# Patient Record
Sex: Female | Born: 1950
Health system: Southern US, Community
[De-identification: ages and names within clinical notes are randomized; demographics above are authoritative.]

## PROBLEM LIST (undated history)

## (undated) DIAGNOSIS — E785 Hyperlipidemia, unspecified: Secondary | ICD-10-CM

## (undated) DIAGNOSIS — I10 Essential (primary) hypertension: Secondary | ICD-10-CM

## (undated) DIAGNOSIS — I639 Cerebral infarction, unspecified: Secondary | ICD-10-CM

## (undated) DIAGNOSIS — I1A0 Resistant hypertension: Secondary | ICD-10-CM

## (undated) HISTORY — DX: Resistant hypertension: I1A.0

## (undated) HISTORY — DX: Essential (primary) hypertension: I10

---

## 2006-08-14 ENCOUNTER — Ambulatory Visit: Payer: Self-pay | Admitting: Cardiology

## 2006-08-15 ENCOUNTER — Inpatient Hospital Stay (HOSPITAL_COMMUNITY): Admission: AD | Admit: 2006-08-15 | Discharge: 2006-08-18 | Payer: Self-pay | Admitting: Cardiology

## 2006-08-15 ENCOUNTER — Ambulatory Visit: Payer: Self-pay | Admitting: Cardiology

## 2006-09-24 ENCOUNTER — Ambulatory Visit: Payer: Self-pay | Admitting: Physician Assistant

## 2006-09-26 ENCOUNTER — Ambulatory Visit: Payer: Self-pay | Admitting: Cardiology

## 2006-10-10 ENCOUNTER — Ambulatory Visit: Payer: Self-pay | Admitting: Cardiology

## 2006-11-14 ENCOUNTER — Ambulatory Visit: Payer: Self-pay | Admitting: Cardiology

## 2006-11-28 ENCOUNTER — Ambulatory Visit: Payer: Self-pay | Admitting: Cardiology

## 2008-06-26 ENCOUNTER — Encounter: Payer: Self-pay | Admitting: Cardiology

## 2010-06-28 NOTE — Assessment & Plan Note (Signed)
[facility]                          [facility]   Danielle Jordan, Danielle Jordan                        MRN:          [id number]  DATE:11/14/2006                            DOB:          August 07, 1950    HISTORY OF PRESENT ILLNESS:  The patient is an African-American female  recently evaluated for uncontrolled hypertension.  Adjustments in  medications were made, but, unfortunately, the patient did not make  those changes.  In particular, Norvasc was supposed to be increased as  well as carvedilol.  She has a long-standing history of hypertension,  but further evaluation for secondary cause of hypertension was  performed.  In particular, the patient had a 24-hour urine collection to  rule out pheochromocytoma.  The 24-hour urine collection, however, was  negative for pheochromocytoma.  Prior cardiac catheterization also  demonstrated no renal artery stenosis.  The patient states that she is  otherwise compliant with her medications.  She has no symptoms  associated with significant hypertension.   MEDICATIONS:  1. Potassium 20 mEq p.o. daily.  2. Triamterine/hydrochlorothiazide 37.5/25 mg p.o. daily.  3. Simvastatin 40 mg a day.  4. Lisinopril 40 mg a day.  5. Clonidine patch 0.1 mg q. 7 days.  6. Norvasc 5 mg a day.  7. Carvedilol 25 mg, the patient is only taking it once a day.   PHYSICAL EXAMINATION:  VITAL SIGNS:  Blood pressure 164/108, heart rate  85, weight is 209 pounds.  NECK EXAM:  Normal carotid upstrokes, no carotid bruits.  LUNGS:  Clear breath sounds bilaterally.  HEART:  Regular rate and rhythm, normal S1, S2, no murmurs, rubs, or  gallops.  ABDOMEN:  Soft.  EXTREMITY EXAM:  No cyanosis, clubbing, or edema.   PROBLEM LIST:  1. Uncontrolled hypertension.      a.     No evidence of renal artery stenosis by catheterization.      b.     Negative for 24-hour urine collection for pheochromocytoma.      c.     Rule out  hyperaldosteronism.  2. Normal coronary arteries by catheterization.  3. Normal left ventricular function.  4. Glucose __________ .  5. Medical noncompliance.   PLAN:  1. I have increased the patient's Norvasc to 10 mg a day and made sure      that she takes carvedilol 25 mg p.o. b.i.d.  If the patient still      has poorly controlled blood pressure, she could have an evaluation      with plasma renin activity serum aldosterone level to rule out      hyperaldosteronism.  2. The patient will followup for an RN visit next week to reassess her      blood pressure.  3. Additionally, clonidine could also be increased.     Learta Codding, MD,FACC  Electronically Signed    GED/MedQ  DD: 11/15/2006  DT: 11/15/2006  Job #: [id number]

## 2010-06-28 NOTE — Assessment & Plan Note (Signed)
[facility]                          [facility]   Danielle Jordan, Danielle Jordan                        MRN:          [id number]  DATE:09/24/2006                            DOB:          06/09/1950    PRIMARY CARE PHYSICIAN:  Danielle Jordan, M.D.   PRIMARY CARDIOLOGIST:  Danielle Jordan, M.D.   HISTORY OF PRESENT ILLNESS:  Danielle Jordan is a 60 year old female patient  without previously known coronary disease who was seen at [facility] in consultation by Danielle Jordan on August 15, 2006.  She presented  complaining of chest tightness and she had borderline elevated  troponin's that were somewhat nonspecific.  Her blood pressure was  significantly elevated in the 200's/mid 100's.  Her Cardiolite study was  somewhat positive with an anterior apical defect consistent with  ischemia.  She was transferred to [facility] for further  evaluation and treatment.  Cardiac catheterization was done on August 16, 2006 by Dr. Riley Jordan.  This revealed no critical coronary artery disease.  She did have a 30% segmental narrowing of the acute margin of the right  coronary artery.  The rest of her coronaries were normal.  Her overall  left ventricular function was preserved with an ejection fraction of 55  to 60% and vigorous left ventricular function.  Distal aortogram  revealed no evidence of high grade renal artery stenosis.  During her  hospitalization she had multiple changes in her medications to help  control her blood pressure which was uncontrolled.  She did undergo 24  hour urine for protein.  This was positive for proteinuria with 180 mg  per day.  She was discharged from the hospital and set up for followup  today.  Of note from the discharge summary, she was noted to have  impaired glucose tolerance.  It was suggested that she followup with Dr.  Linna Jordan for this.  She also was noted to possibly have sleep apnea.  This  was to be worked up as an  outpatient.  There was also question of  whether or not she should have a 24 hour urine to rule out  pheochromocytoma.   She returns to the office today for followup.  Overall, she seems to be  doing well.  She denies any chest pain or shortness of breath.  She  denies any syncope or near syncope.  She denies any orthopnea, PND,  pedal edema.  She denies any palpitations.  She denies any headache or  blurred vision.   CURRENT MEDICATIONS:  1. Potassium 20 mEq daily.  2. Maxzide 37.5/25 mg daily.  3. Simvastatin 40 mg daily.  4. Lisinopril 20 mg daily.  5. Carvedilol 12.5 mg b.i.d.  6. Clonidine patch 0.1 mg q.7 days.   PHYSICAL EXAMINATION:  GENERAL APPEARANCE:  She is a well-nourished,  well-developed female in no distress.  VITAL SIGNS:  Blood pressure initially was 215/117.  HEENT:  Fundi are visualized bilaterally without signs of papilledema.  NECK:  Without jugular venous distention.  CARDIAC:  Normal S1 and S2.  Regular rate and rhythm  without murmurs.  LUNGS:  Clear to auscultation bilaterally without rales, rhonchi or  wheezing.  ABDOMEN:  Soft, nontender, with normal bowel sounds.  No organomegaly.  EXTREMITIES:  Without edema.  Calves soft, nontender.  SKIN:  Warm and dry.  NEUROLOGICAL:  She is alert and oriented x3.  Cranial nerves II-XII are  grossly intact.   CLINICAL DATA:  Electrocardiogram reveals sinus rhythm with heart rate  of 69.  Normal axis.  No acute changes.   IN OFFICE CARE:  The patient was given 0.1 mg of Clonidine x3 and  watched over the next two hours.  Her blood pressure eventually came  down to 188/114 on the left and 160/108 on the right.  Again, she  remained asymptomatic.   IMPRESSION:  1. Uncontrolled hypertension.  2. Essentially normal coronary arteries by catheterization with      minimal plaquing in the right coronary artery as outlined above by      recent catheterization on August 16, 2006.  3. Preserved left ventricular  function.  4. Glucose intolerance.      a.     Needs followup with primary care.  5. History of noncompliance.   PLAN:  As noted above, we spent around 2-1/2 to 3 hours with the patient  today trying to get her blood pressure down.  She remained asymptomatic  throughout.  She was given a total of 0.3 mg of Clonidine.  She needs  better control of her blood pressure.  At this point in time we will  plan to:  1. Initiate Norvasc 5 mg daily.  2. Increase her Lisinopril to 40 mg daily.  3. Check a BMET today.  4. She will have a followup blood pressure check with the nurse      Wednesday of this week.  At that time, if her blood pressure      remains uncontrolled, we will increase her Catapres patch to 0.2      mg.  5. She will be set up for a 24 hour urine to check for VMA      catecholamines and metanephrine's to rule out the possibility of      pheochromocytoma.  6. She will also be set up for apnea link at home to rule out the      possibility of sleep apnea which may be contributing somewhat to      her hypertension.  7. She will need a followup BMET in a week to follow up on her renal      function and potassium given the increase in her ACE inhibitor.  8. She will be set up for followup in two weeks in this office.  9. She has been encouraged to follow up with Dr. Linna Jordan for recent      diagnosis of glucose intolerance.      Danielle Newcomer, PA-C  Electronically Signed      Danielle Sidle, MD  Electronically Signed   SW/MedQ  DD: 09/24/2006  DT: 09/25/2006  Job #: [id number]   cc:   Danielle Downer, MD

## 2010-06-28 NOTE — Assessment & Plan Note (Signed)
[facility]                          [facility]   Danielle Jordan, Danielle Jordan                        MRN:          [id number]  DATE:10/10/2006                            DOB:          1950-09-11    CARDIOLOGIST:  Dr. Lewayne Bunting   PRIMARY CARE PHYSICIAN:  She will be established with Dr. Linward Foster.   REASON FOR VISIT:  Two week followup.   HISTORY OF PRESENT ILLNESS:  Danielle Jordan is a 60 year old female patient  with uncontrolled hypertension and returns to the office today for  followup.  Please see the note from September 24, 2006, for complete  details.  Since being seen in the office on August 11, we have adjusted  her medications somewhat.  She is now on 10 mg of Norvasc a day.  She  was seen in followup on August 13, and her blood pressure was much  improved to 149/94.  She admits to medication compliance.  She denies  chest pain, shortness of breath.  Denies headaches or blurry vision.  Denies any syncope.   CURRENT MEDICATIONS:  1. Potassium 20 mEq daily.  2. Triamterine/HCTZ 37.5/25 mg daily.  3. Simvastatin 40 mg daily.  4. Lisinopril 40 mg daily.  5. Carvedilol 12.5 mg b.i.d.  6. Clonidine patch 0.1 mg q 7 days.  7. Norvasc 10 mg daily.   ALLERGIES:  No known drug allergies.   PHYSICAL EXAM:  She is a well-nourished, well-developed female.  Blood pressure is 166/111, pulse 69, weight 207 pounds.  HEENT:  Normal.  NECK:  Without JVD.  CARDIAC:  Normal S1, S2.  Regular rate and rhythm.  LUNGS:  Clear to auscultation bilaterally.  ABDOMEN:  Soft, nontender.  EXTREMITIES:  Without edema.  Calves are soft, nontender.  NEUROLOGIC:  She is alert and oriented x3.  Cranial nerves 2-12 grossly  intact.   IMPRESSION:  1. Uncontrolled hypertension.      a.     No renal artery stenosis at catheterization.      b.     Recent 24 hour urine negative for pheochromocytoma.      c.     Apnea link pending to screen for obstructive  sleep apnea.  2. Essentially normal coronary arteries by catheterization with      minimal plaque in the right coronary artery by catheterization,      August 16, 2006.  3. Good left ventricular function.  4. Glucose intolerance.  5. History of medical noncompliance.   PLAN:  The patient presents to the office today for followup.  Her blood  pressure continues to remain uncontrolled.  She admits to medication  compliance.  At this point in time, I plan to adjust her medications for  better blood pressure control.  1. We will increase her carvedilol to 25 mg b.i.d.  2. We will increase her Clonidine patch to 0.2 mg, apply q 7 days.  3. We will have her follow up with blood pressure check with the nurse      in 1 week.  4. We will have her follow  up in this office within the next 4-6      weeks.  5. She would like to change her primary care physician.  She does need      followup on glucose intolerance.  I will refer her to Dr. Linward Foster to be established for primary care.   DISPOSITION:  We will reassess the patient's blood pressure when she  returns.  Of note, the patient did initially present on no medications  with uncontrolled hypertension and hypokalemia.  We may want to consider  a diagnosis of hyperaldosteronism.  Therefore, in followup, we may want  to consider workup for this.  Of note, she is on an ACE inhibitor,  potassium-sparing diuretic, and potassium supplementation with normal  potassium recently.      Tereso Newcomer, PA-C  Electronically Signed      Learta Codding, MD,FACC  Electronically Signed   SW/MedQ  DD: 10/10/2006  DT: 10/11/2006  Job #: [id number]

## 2010-06-28 NOTE — Discharge Summary (Signed)
[name], Jordan NO.:  [id number]   MEDICAL RECORD NO.:  [id number]          PATIENT TYPE:  INP   LOCATION:  3711                         FACILITY:  [facility]   PHYSICIAN:  Dorian Pod, ACNP  DATE OF BIRTH:  1950-03-14   DATE OF ADMISSION:  08/15/2006  DATE OF DISCHARGE:  08/18/2006                               DISCHARGE SUMMARY   DISCHARGING DIAGNOSES:  1. Hypertension, poorly controlled.  2. Chest pain, status post cardiac catheterization this admission      showing no critical coronary obstruction, no evidence of high-grade      renal artery stenosis, normal ejection fraction of 55-60%,      reasonably vigorous and severe hypertension, uncontrolled, with      poor patient compliance.  Status post 24-hour urine, urine protein      189.   Past medical history includes:  1. Hypertension, poorly controlled.  2. History of medical noncompliance to medications.  3. Status post adenosine Cardiolite, small the moderate anterior      apical defect which is partially reversible, with EF of 45% on August 15, 2006, done at [facility].  4. Impaired glucose tolerance, glucose by BMET this admission 113.   HOSPITAL COURSE:  Ms. Danielle Jordan is a 60 year old female followed by Dr.  Beatriz Stallion group up in [place], who presented to [facility] complaining  of chest discomfort.  We were asked to.  Consult the patient was  underwent a stress Myoview, results as stated above.  The patient also  found to be hypertensive with a blood pressure of 200/100.  It was felt  the patient needed further evaluation secondary to risk factors for  heart disease.  The patient was transported to [facility], underwent  cardiac catheterization on August 16, 2006, results as stated above.  Post  catheterization patient stable, seen by Dr. Andee Lineman on July 4.  Blood  pressure at that time 169/110.  Patient being treated with clonidine  patch, Coreg increased to 12.5 mg b.i.d., a 24-hour urine  completed as  stated above.  Need to consider ruling out a pheochromocytoma.  Dr.  Graciela Husbands in to see the patient on day of discharge.  Blood pressure 138-159  systolic over 96-90 diastolic.  The patient without complaints.  Patient  being discharged home.  Consider probable obstructive sleep apnea.  The  patient will need outpatient sleep study, which can be arranged through  her primary care physician or Dr. Andee Lineman in [place].   At time of discharge, TSH 2.357.  Lipid panel:  Total cholesterol 159,  triglycerides 116, HDL 35, LDL 101.  H&H 13 and 39.8.  potassium 3.7,  BUN and creatinine 11 and 1.02.   Medications at time of discharge include:  1. Potassium 20 mEq daily.  2. Triamterene/hydrochlorothiazide 37.5/25 mg daily.  3. Simvastatin 40 mg daily.  4. Lisinopril 20 mg daily.  5. Carvedilol 12.5 mg b.i.d.  6. Clonidine patch 0.1 mg topical every 7 days.   The patient will need fasting lipids and liver in 4-6 weeks.  Follow up  with Dr.  Moore for impaired glucose tolerance and sleep study.  Patient  also encouraged to take an aspirin 81 mg daily.  She will need to follow  up with Dr. Andee Lineman within 2 weeks for a post catheterization check and  blood pressure monitoring.  She has been given the post cardiac  catheterization discharge instructions.  Agrees to call our office with  any problems from her catheterization site.  Otherwise, no further  cardiac testing at this time.  The patient's cholesterol and blood  pressure can be managed by her primary care physician.   Duration of discharge encounter is greater than 30 minutes.      Dorian Pod, ACNP     MB/MEDQ  D:  08/18/2006  T:  08/18/2006  Job:  [id number]   cc:   Erasmo Downer, MD

## 2010-06-28 NOTE — Cardiovascular Report (Signed)
Danielle Jordan, Danielle Jordan NO.:  [id number]   MEDICAL RECORD NO.:  [id number]          PATIENT TYPE:  INP   LOCATION:  2807                         FACILITY:  [facility]   PHYSICIAN:  Arturo Morton. Riley Kill, MD, FACCDATE OF BIRTH:  09-Nov-1950   DATE OF PROCEDURE:  08/16/2006  DATE OF DISCHARGE:                            CARDIAC CATHETERIZATION   INDICATIONS:  Danielle Jordan is a 60 year old who presents with some chest  discomfort and shortness of breath in the setting of severe hypertension  that is not well controlled.  She has had a history of hypertension  since age 55, and has not taken any medications.  She has a poor  understanding of her disease.  She was seen earlier in the day and  risks, benefits and alternatives discussed in detail.  She was brought  to the catheterization laboratory after informed consent.   PROCEDURE:  1. Left heart catheterization.  2. Selective left ventriculography.  3. Distal aortography.  4. Selective coronary arteriography.   DESCRIPTION OF PROCEDURE:  The patient was brought to the  catheterization laboratory and prepped and draped in usual fashion.  Consistent blood pressures appeared to be in the range of about 220/130.  She was given 2 doses of intravenous metoprolol; she also was given a  dose of 10 mg of intravenous hydralazine.  We gave her some time in the  laboratory, and then subsequently elected to give her a second dose of  10 mg of hydralazine.  At this point, the blood pressure was around  160/95-100.  The femoral artery was then entered with a Smart needle,  and a 5-French sheath was placed.  Pressures were then checked.  We did  give 1 dose of intra-aortic nitroglycerin and some nitroglycerin spray  and started the patient on a nitroglycerin drip; the drip was started at  approximately 15 mcg.  Central aortic and left ventricular pressures  were measured with a pigtail and ventriculography was performed in the  RAO projection.   Following this, distal aortography was performed.  The  patient did remain somewhat hypertensive at this point in time, but the  pressure then gradually came down and the nitroglycerin was discontinued  as an intravenous drip.  We then gave a dose of 250 mL of normal saline  because the pressure fell to approximately 100/60.  The pressure then  came back up into the 110-120 range, and the femoral sheath was removed.  Hemostasis was achieved by direct manual compression of greater than 20  minutes including myself and the technologist; we observed this  carefully.  Blood pressures were observed carefully in the laboratory,  and normal saline was continued.  She was not hypertensive during the  course of the procedure.   HEMODYNAMIC DATA:  Central aortic pressure was 182/110 with a mean of  142.  LV pressure was 203/10.  There was no gradient on pullback across  aortic valve.  The blood pressures are as noted in the procedural  report.   ANGIOGRAPHIC DATA:  1. Ventriculography was done in the RAO projection.  Overall, LV  function appeared to be preserved without definite segmental wall      motion abnormality.  We did not do a proximal aortic root shot, but      in the RAO view, at least the aortic root did not appear to be      significantly dilated.  We had no difficulty with the catheters.      The abdominal aorta demonstrates a fairly normal contour and smooth      size.  Renal arteries are reasonably well visualized bilaterally.      I do not visualize any obvious renal stenoses.  There was 1 renal      artery on the right and possibly 2 on the left.  That iliacs are      smooth, but somewhat tortuous.  The right coronary artery is a      fairly small-caliber vessel that has about 30% segmental narrowing      at the acute margin without critical narrowing.  The distal vessel      was really quite small.  2. The left main is a very large-caliber vessel.  The left coronaries       are hypertrophied.  The LAD courses to the apex and provides a      major diagonal branch.  It appears to be without critical      narrowing.  Likewise, there is a ramus intermedius that bifurcates      and is without critical narrowing.  Finally, there is a small AV      circumflex that also is without critical on narrowing.   CONCLUSION:  1. Reasonably vigorous left ventricular function with ejection      fraction in excess of 55% to 60%.  2. No evidence of high-grade renal artery stenosis.  3. No critical coronary obstruction.  4. Severe hypertension, uncontrolled with poor patient compliance.   PLAN:  1. The patient does have proteinuria and a 24-hour urine be obtained.  2. We will likely get a nephrology consult.  3. She will need hypertension management, which will need to be      carefully orchestrated and she will need better compliance.      Arturo Morton. Riley Kill, MD, [facility]  Electronically Signed     TDS/MEDQ  D:  08/16/2006  T:  08/17/2006  Job:  [id number]   cc:   Learta Codding, MD,FACC  Erasmo Downer, MD

## 2010-11-29 LAB — CBC
HCT: 41.7
MCHC: 32.9
MCV: 82.3
Platelets: 217
RBC: 4.76
RDW: 16.1 — ABNORMAL HIGH
WBC: 9

## 2010-11-29 LAB — BASIC METABOLIC PANEL
Calcium: 8.6
Chloride: 104
Creatinine, Ser: 1.02
GFR calc Af Amer: >60
Sodium: 136

## 2010-11-29 LAB — TSH: TSH: 2.357

## 2010-11-29 LAB — LIPID PANEL
LDL Cholesterol: 101 — ABNORMAL HIGH
VLDL: 23

## 2010-11-29 LAB — COMPREHENSIVE METABOLIC PANEL
Albumin: 3.2 — ABNORMAL LOW
BUN: 12
Calcium: 9.3
Chloride: 99
Creatinine, Ser: 1.12
GFR calc Af Amer: >60
Total Bilirubin: 1.1

## 2010-11-29 LAB — PROTEIN, URINE, 24 HOUR
Protein, 24H Urine: 189 — ABNORMAL HIGH
Urine Total Volume-UPROT: 1050

## 2010-11-29 LAB — APTT: aPTT: 31

## 2010-11-29 LAB — PROTIME-INR: INR: 1

## 2016-06-24 DIAGNOSIS — I629 Nontraumatic intracranial hemorrhage, unspecified: Secondary | ICD-10-CM | POA: Insufficient documentation

## 2016-07-04 DIAGNOSIS — I610 Nontraumatic intracerebral hemorrhage in hemisphere, subcortical: Secondary | ICD-10-CM | POA: Insufficient documentation

## 2016-07-04 DIAGNOSIS — E8881 Metabolic syndrome: Secondary | ICD-10-CM | POA: Insufficient documentation

## 2016-07-04 DIAGNOSIS — Z7409 Other reduced mobility: Secondary | ICD-10-CM | POA: Insufficient documentation

## 2016-07-04 DIAGNOSIS — I69319 Unspecified symptoms and signs involving cognitive functions following cerebral infarction: Secondary | ICD-10-CM | POA: Insufficient documentation

## 2016-07-04 DIAGNOSIS — I10 Essential (primary) hypertension: Secondary | ICD-10-CM | POA: Insufficient documentation

## 2016-07-20 DIAGNOSIS — R600 Localized edema: Secondary | ICD-10-CM | POA: Insufficient documentation

## 2016-07-20 DIAGNOSIS — G8194 Hemiplegia, unspecified affecting left nondominant side: Secondary | ICD-10-CM | POA: Insufficient documentation

## 2016-07-20 DIAGNOSIS — J9801 Acute bronchospasm: Secondary | ICD-10-CM | POA: Insufficient documentation

## 2016-07-26 ENCOUNTER — Emergency Department (HOSPITAL_COMMUNITY): Payer: BLUE CROSS/BLUE SHIELD

## 2016-07-26 ENCOUNTER — Encounter (HOSPITAL_COMMUNITY): Payer: Self-pay | Admitting: *Deleted

## 2016-07-26 ENCOUNTER — Inpatient Hospital Stay (HOSPITAL_COMMUNITY)
Admission: EM | Admit: 2016-07-26 | Discharge: 2016-07-28 | DRG: 305 | Disposition: A | Discharge status: Home / Self Care | Payer: BLUE CROSS/BLUE SHIELD | Attending: Internal Medicine | Admitting: Internal Medicine

## 2016-07-26 DIAGNOSIS — E876 Hypokalemia: Secondary | ICD-10-CM | POA: Diagnosis present

## 2016-07-26 DIAGNOSIS — Z23 Encounter for immunization: Secondary | ICD-10-CM

## 2016-07-26 DIAGNOSIS — F419 Anxiety disorder, unspecified: Secondary | ICD-10-CM | POA: Diagnosis present

## 2016-07-26 DIAGNOSIS — I161 Hypertensive emergency: Secondary | ICD-10-CM | POA: Diagnosis not present

## 2016-07-26 DIAGNOSIS — R05 Cough: Secondary | ICD-10-CM | POA: Diagnosis present

## 2016-07-26 DIAGNOSIS — I10 Essential (primary) hypertension: Secondary | ICD-10-CM | POA: Diagnosis present

## 2016-07-26 DIAGNOSIS — I69151 Hemiplegia and hemiparesis following nontraumatic intracerebral hemorrhage affecting right dominant side: Secondary | ICD-10-CM | POA: Diagnosis not present

## 2016-07-26 DIAGNOSIS — Z823 Family history of stroke: Secondary | ICD-10-CM

## 2016-07-26 DIAGNOSIS — I714 Abdominal aortic aneurysm, without rupture, unspecified: Secondary | ICD-10-CM | POA: Diagnosis present

## 2016-07-26 DIAGNOSIS — G4700 Insomnia, unspecified: Secondary | ICD-10-CM | POA: Diagnosis present

## 2016-07-26 DIAGNOSIS — T464X5A Adverse effect of angiotensin-converting-enzyme inhibitors, initial encounter: Secondary | ICD-10-CM | POA: Diagnosis present

## 2016-07-26 DIAGNOSIS — Z8249 Family history of ischemic heart disease and other diseases of the circulatory system: Secondary | ICD-10-CM | POA: Diagnosis not present

## 2016-07-26 DIAGNOSIS — E785 Hyperlipidemia, unspecified: Secondary | ICD-10-CM | POA: Diagnosis present

## 2016-07-26 DIAGNOSIS — R0789 Other chest pain: Secondary | ICD-10-CM | POA: Diagnosis present

## 2016-07-26 DIAGNOSIS — I639 Cerebral infarction, unspecified: Secondary | ICD-10-CM | POA: Diagnosis present

## 2016-07-26 DIAGNOSIS — I169 Hypertensive crisis, unspecified: Secondary | ICD-10-CM

## 2016-07-26 HISTORY — DX: Cerebral infarction, unspecified: I63.9

## 2016-07-26 HISTORY — DX: Essential (primary) hypertension: I10

## 2016-07-26 HISTORY — DX: Hyperlipidemia, unspecified: E78.5

## 2016-07-26 LAB — COMPREHENSIVE METABOLIC PANEL
ALK PHOS: 52 U/L (ref 38–126)
ALT: 19 U/L (ref 14–54)
AST: 15 U/L (ref 15–41)
Albumin: 3.9 g/dL (ref 3.5–5.0)
Anion gap: 10 (ref 5–15)
BILIRUBIN TOTAL: 0.8 mg/dL (ref 0.3–1.2)
BUN: 23 mg/dL — AB (ref 6–20)
CO2: 29 mmol/L (ref 22–32)
CREATININE: 1.31 mg/dL — AB (ref 0.44–1.00)
Calcium: 9.5 mg/dL (ref 8.9–10.3)
Chloride: 99 mmol/L — ABNORMAL LOW (ref 101–111)
GFR calc Af Amer: 49 mL/min — ABNORMAL LOW (ref >60)
GFR, EST NON AFRICAN AMERICAN: 42 mL/min — AB (ref >60)
Glucose, Bld: 126 mg/dL — ABNORMAL HIGH (ref 65–99)
Potassium: 3.4 mmol/L — ABNORMAL LOW (ref 3.5–5.1)
Sodium: 138 mmol/L (ref 135–145)
TOTAL PROTEIN: 8.1 g/dL (ref 6.5–8.1)

## 2016-07-26 LAB — CBC WITH DIFFERENTIAL/PLATELET
BASOS ABS: 0 10*3/uL (ref 0.0–0.1)
Basophils Relative: 0 %
Eosinophils Absolute: 0.2 10*3/uL (ref 0.0–0.7)
Eosinophils Relative: 2 %
HEMATOCRIT: 40.8 % (ref 36.0–46.0)
Hemoglobin: 12.7 g/dL (ref 12.0–15.0)
LYMPHS PCT: 15 %
Lymphs Abs: 1.6 10*3/uL (ref 0.7–4.0)
MCH: 26.4 pg (ref 26.0–34.0)
MCHC: 31.1 g/dL (ref 30.0–36.0)
MCV: 84.8 fL (ref 78.0–100.0)
MONO ABS: 0.4 10*3/uL (ref 0.1–1.0)
Monocytes Relative: 3 %
NEUTROS ABS: 8.6 10*3/uL — AB (ref 1.7–7.7)
Neutrophils Relative %: 80 %
Platelets: 238 10*3/uL (ref 150–400)
RBC: 4.81 MIL/uL (ref 3.87–5.11)
RDW: 14.9 % (ref 11.5–15.5)
WBC: 10.8 10*3/uL — AB (ref 4.0–10.5)

## 2016-07-26 LAB — MAGNESIUM: MAGNESIUM: 1.8 mg/dL (ref 1.7–2.4)

## 2016-07-26 LAB — TROPONIN I

## 2016-07-26 MED ORDER — POTASSIUM CHLORIDE CRYS ER 20 MEQ PO TBCR
40.0000 meq | EXTENDED_RELEASE_TABLET | Freq: Once | ORAL | Status: AC
  Administered 2016-07-26: 40 meq via ORAL
  Filled 2016-07-26: qty 2

## 2016-07-26 MED ORDER — DEXTROMETHORPHAN POLISTIREX ER 30 MG/5ML PO SUER
30.0000 mg | Freq: Every evening | ORAL | Status: DC | PRN
  Filled 2016-07-26: qty 5

## 2016-07-26 MED ORDER — ACETAMINOPHEN 325 MG PO TABS: 650.0000 mg | ORAL_TABLET | Freq: Four times a day (QID) | ORAL | Status: DC | PRN

## 2016-07-26 MED ORDER — ONDANSETRON HCL 4 MG/2ML IJ SOLN: 4.0000 mg | Freq: Four times a day (QID) | INTRAMUSCULAR | Status: DC | PRN

## 2016-07-26 MED ORDER — HYDROCHLOROTHIAZIDE 12.5 MG PO CAPS
12.5000 mg | ORAL_CAPSULE | Freq: Every day | ORAL | Status: DC
  Administered 2016-07-27 – 2016-07-28 (×2): 12.5 mg via ORAL
  Filled 2016-07-26 (×2): qty 1

## 2016-07-26 MED ORDER — ALPRAZOLAM 0.5 MG PO TABS: 0.5000 mg | ORAL_TABLET | Freq: Every evening | ORAL | Status: DC | PRN

## 2016-07-26 MED ORDER — HYDRALAZINE HCL 25 MG PO TABS
100.0000 mg | ORAL_TABLET | Freq: Three times a day (TID) | ORAL | Status: DC
  Administered 2016-07-27 – 2016-07-28 (×4): 100 mg via ORAL
  Filled 2016-07-26 (×4): qty 4

## 2016-07-26 MED ORDER — LABETALOL HCL 5 MG/ML IV SOLN
INTRAVENOUS | Status: AC
  Filled 2016-07-26: qty 16

## 2016-07-26 MED ORDER — LABETALOL HCL 200 MG PO TABS
400.0000 mg | ORAL_TABLET | Freq: Two times a day (BID) | ORAL | Status: DC
  Administered 2016-07-26 – 2016-07-28 (×4): 400 mg via ORAL
  Filled 2016-07-26 (×4): qty 2

## 2016-07-26 MED ORDER — LABETALOL HCL 5 MG/ML IV SOLN
80.0000 mg | Freq: Once | INTRAVENOUS | Status: AC
  Administered 2016-07-26: 80 mg via INTRAVENOUS

## 2016-07-26 MED ORDER — PNEUMOCOCCAL VAC POLYVALENT 25 MCG/0.5ML IJ INJ
0.5000 mL | INJECTION | INTRAMUSCULAR | Status: AC
  Administered 2016-07-27: 0.5 mL via INTRAMUSCULAR
  Filled 2016-07-26: qty 0.5

## 2016-07-26 MED ORDER — LABETALOL HCL 5 MG/ML IV SOLN
40.0000 mg | Freq: Once | INTRAVENOUS | Status: AC
  Administered 2016-07-26: 40 mg via INTRAVENOUS
  Filled 2016-07-26: qty 8

## 2016-07-26 MED ORDER — LABETALOL HCL 5 MG/ML IV SOLN
INTRAVENOUS | Status: AC
  Filled 2016-07-26: qty 4

## 2016-07-26 MED ORDER — NICARDIPINE HCL IN NACL 20-0.86 MG/200ML-% IV SOLN
3.0000 mg/h | Freq: Once | INTRAVENOUS | Status: DC
Start: 2016-07-26 — End: 2016-07-28
  Filled 2016-07-26: qty 200

## 2016-07-26 MED ORDER — NICARDIPINE HCL IN NACL 20-0.86 MG/200ML-% IV SOLN
3.0000 mg/h | Freq: Once | INTRAVENOUS | Status: AC
  Administered 2016-07-26: 3 mg/h via INTRAVENOUS
  Filled 2016-07-26: qty 200

## 2016-07-26 NOTE — ED Notes (Signed)
I gave pt a total of 5 mg of labetalol before dr zammit discontinued order. Dr Robb Matarortiz wants to start cardene drip on pt instead.

## 2016-07-26 NOTE — ED Triage Notes (Signed)
Pt was seen at [facility] on may 11th for bleed in her brain (pt doesn't know specifics). Discharged Friday and started on meds. Pt comes in today because she is having high blood pressure, nose bleeds, and left lower foot numbness and itching. The itching/numbness symptom has resolved.

## 2016-07-26 NOTE — ED Provider Notes (Signed)
[facility]-EMERGENCY DEPT Provider Note   CSN: [id number] Arrival date & time: 07/26/16  1613     History   Chief Complaint Chief Complaint  Patient presents with  . Hypertension    HPI Danielle Jordan is a 66 y.o. female.  Patient states that she was [facility] for a hemorrhagic stroke last week. She was let home on Friday. Her blood pressures been elevated ever since then and she is complained of some numbness in her foot and some bleeding from her nose today. The numbness has improved    Hypertension  This is a new problem. The current episode started more than 2 days ago. The problem occurs constantly. The problem has not changed since onset.Pertinent negatives include no chest pain, no abdominal pain and no headaches. Nothing aggravates the symptoms. Nothing relieves the symptoms. She has tried nothing for the symptoms.    Past Medical History:  Diagnosis Date  . Hypertension   . Stroke [facility])     Patient Active Problem List   Diagnosis Date Noted  . Hypertensive emergency 07/26/2016    History reviewed. No pertinent surgical history.  OB History    No data available       Home Medications    Prior to Admission medications   Medication Sig Start Date End Date Taking? Authorizing Provider  hydrALAZINE (APRESOLINE) 100 MG tablet Take 100 mg by mouth every 8 (eight) hours.  07/21/16  Yes [provider]  hydrochlorothiazide (MICROZIDE) 12.5 MG capsule Take 12.5 mg by mouth daily. 07/21/16  Yes [provider]  labetalol (NORMODYNE) 200 MG tablet Take 400 mg by mouth every 12 (twelve) hours.  07/21/16  Yes [provider]  lisinopril (PRINIVIL,ZESTRIL) 40 MG tablet Take 40 mg by mouth. 07/21/16  Yes [provider]    Family History History reviewed. No pertinent family history.  Social History Social History  Substance Use Topics  . Smoking status: Never Smoker  . Smokeless tobacco: Never Used  .  Alcohol use No     Allergies   Patient has no known allergies.   Review of Systems Review of Systems  Constitutional: Negative for appetite change and fatigue.  HENT: Negative for congestion, ear discharge and sinus pressure.        Nasal bleed  Eyes: Negative for discharge.  Respiratory: Negative for cough.   Cardiovascular: Negative for chest pain.  Gastrointestinal: Negative for abdominal pain and diarrhea.  Genitourinary: Negative for frequency and hematuria.  Musculoskeletal: Negative for back pain.  Skin: Negative for rash.  Neurological: Negative for seizures and headaches.       Foot numbness  Psychiatric/Behavioral: Negative for hallucinations.     Physical Exam Updated Vital Signs BP (!) 211/101   Pulse 67   Temp 99.3 F (37.4 C) (Oral)   Resp 18   SpO2 95%   Physical Exam  Constitutional: She is oriented to person, place, and time. She appears well-developed.  HENT:  Head: Normocephalic.  Eyes: Conjunctivae and EOM are normal. No scleral icterus.  Neck: Neck supple. No thyromegaly present.  Cardiovascular: Normal rate and regular rhythm.  Exam reveals no gallop and no friction rub.   No murmur heard. Pulmonary/Chest: No stridor. She has no wheezes. She has no rales. She exhibits no tenderness.  Abdominal: She exhibits no distension. There is no tenderness. There is no rebound.  Musculoskeletal: Normal range of motion. She exhibits no edema.  Lymphadenopathy:    She has no cervical adenopathy.  Neurological: She is oriented to person, place, and time. She exhibits normal muscle tone. Coordination normal.  Skin: No rash noted. No erythema.  Psychiatric: She has a normal mood and affect. Her behavior is normal.     ED Treatments / Results  Labs (all labs ordered are listed, but only abnormal results are displayed) Labs Reviewed  CBC WITH DIFFERENTIAL/PLATELET - Abnormal; Notable for the following:       Result Value   WBC 10.8 (*)    Neutro Abs 8.6  (*)    All other components within normal limits  COMPREHENSIVE METABOLIC PANEL - Abnormal; Notable for the following:    Potassium 3.4 (*)    Chloride 99 (*)    Glucose, Bld 126 (*)    BUN 23 (*)    Creatinine, Ser 1.31 (*)    GFR calc non Af Amer 42 (*)    GFR calc Af Amer 49 (*)    All other components within normal limits    EKG  EKG Interpretation None       Radiology Dg Chest 2 View  Result Date: 07/26/2016 CLINICAL DATA:  Cough EXAM: CHEST  2 VIEW COMPARISON:  08/17/2006 FINDINGS: Lungs are clear.  No pleural effusion or pneumothorax. The heart is top-normal in size. Degenerative changes of the visualized thoracolumbar spine. IMPRESSION: No evidence of acute cardiopulmonary disease. Electronically Signed   By: Charline BillsSriyesh  Krishnan M.D.   On: 07/26/2016 18:08   Ct Head Wo Contrast  Result Date: 07/26/2016 CLINICAL DATA:  66 year old female with left lower foot numbness, left arm numbness, hypertension and recent intracranial hemorrhage. EXAM: CT HEAD WITHOUT CONTRAST TECHNIQUE: Contiguous axial images were obtained from the base of the skull through the vertex without intravenous contrast. COMPARISON:  06/24/2016 CT FINDINGS: Brain: There has been interval resolution of the right thalamic hemorrhage with evolutionary changes in this region. Chronic small-vessel white matter ischemic changes are again identified. There is no evidence of acute infarct, hemorrhage, hydrocephalus, extra-axial collection or mass lesion. Vascular: No hyperdense vessel or unexpected calcification. Skull: Normal. Negative for fracture or focal lesion. Sinuses/Orbits: No acute finding. Other: None. IMPRESSION: No evidence of acute abnormality. Resolution of right thalamic hemorrhage with evolutionary changes in this area. Chronic small-vessel white matter ischemic changes Electronically Signed   By: Harmon PierJeffrey  Hu M.D.   On: 07/26/2016 18:04    Procedures Procedures (including critical care time)  Medications  Ordered in ED Medications  labetalol (NORMODYNE,TRANDATE) 5 MG/ML injection (not administered)  nicardipine (CARDENE) 20mg  in 0.86% saline 200ml IV infusion (0.1 mg/ml) (not administered)  labetalol (NORMODYNE,TRANDATE) injection 40 mg (40 mg Intravenous Given 07/26/16 1740)  labetalol (NORMODYNE,TRANDATE) injection 80 mg (80 mg Intravenous Given 07/26/16 1913)     Initial Impression / Assessment and Plan / ED Course  I have reviewed the triage vital signs and the nursing notes.  Pertinent labs & imaging results that were available during my care of the patient were reviewed by me and considered in my medical decision making (see chart for details).     CRITICAL CARE Performed by: Demetric Dunnaway L Total critical care time: 40 minutes Critical care time was exclusive of separately billable procedures and treating other patients. Critical care was necessary to treat or prevent imminent or life-threatening deterioration. Critical care was time spent personally by me on the following activities: development of treatment plan with patient and/or surrogate as well as nursing, discussions with consultants, evaluation of patient's response to treatment, examination of patient, obtaining history from patient  or surrogate, ordering and performing treatments and interventions, ordering and review of laboratory studies, ordering and review of radiographic studies, pulse oximetry and re-evaluation of patient's condition.  Patient with hypertensive urgency. She will be admitted to ICU and put on a nicardipine drip Final Clinical Impressions(s) / ED Diagnoses   Final diagnoses:  Hypertensive crisis    New Prescriptions New Prescriptions   No medications on file     Bethann Berkshire, MD 07/26/16 1927

## 2016-07-26 NOTE — H&P (Signed)
History and Physical    [name] ZOX:[id number] DOB: 08/26/1950 DOA: 07/26/2016  PCP: Patient, No Pcp Per   Patient coming from: Home.  I have personally briefly reviewed patient's old medical records in [facility]  Chief Complaint: Epistaxis.  HPI: Danielle Jordan is a 66 y.o. female with medical history significant of hypertension, who was just discharged from [facility] on Friday for a recent right thalamic region hemorrhagic CVA with residual right-sided hemiparesis was coming to the emergency department with complaints of epistaxis and hemoptysis following "a bad coughing spell", which she and her relatives think that is related to the patient taking lisinopril 40 mg by mouth daily for high blood pressure. Her relatives stated that her systolic blood pressure has been consistently in the 180s to 190s and her diastolic in the 110s to 120s range. She has been taking hydralazine 100 mg by mouth 3 times a day, labetalol 400 mg by mouth twice a day, hydrochlorothiazide 12.5 mg by mouth daily plus the lisinopril without significant reduction of her BP numbers. She denies dietary indiscretions and does not add salt to meals.  They also mentioned that the patient is also complaining of mild chest discomfort, which is nonradiating, does not last long and denies associated dyspnea, palpitations, diaphoresis, dizziness, nausea or emesis. She complains of mild lower extremity edema on occasion. She denies headache, fever, chills, but feels fatigued. He denies abdominal pain, diarrhea, constipation, melena, hemorrhoids or hematochezia. She denies dysuria, frequency or hematuria. She denies anxiety, depression or insomnia. She denies skin rashes.  Previous records, showed that she had a cardiac cath in 2012 that did not show renal artery stenosis and her workup was negative for pheochromocytoma that time. Last month, she had a echocardiogram, but I am unable to see those results on the care  everywhere tab at this time.  ED Course: The patient's initial blood pressure was 218/109 mmHg, her pulse 84 BPM, respirations 18/m and O2 sat 95% on room air. She was given labetalol 40 mg IVP which transiently lower the blood pressure, then a second dose of labetalol 80 mg IVP was given, but the patient was subsequently started on a nicardipine infusion.   Her EKG was normal sinus rhythm. Troponin was less than 0.03 ng/mL. Urinalysis still pending. WBC 10.8 with a normal differential, hemoglobin 12.7 grams per deciliter and platelets 238. Sodium 136, potassium 3.4, chloride 99 and bicarbonate 29 mmol/L. BUN was 23, creatinine 1.31, calcium 9.5, glucose 126 and magnesium 1.8 mg/dL.  Imaging: Her chest radiograph did not show acute cardiopulmonary pathology. CT scan of the head shows resolution and evolutionary changes of right thalamic hemorrhagic strokes when compared to previous study. Please see images sent full radiology report for further detail.  Review of Systems: As per HPI otherwise 10 point review of systems negative.    Past Medical History:  Diagnosis Date  . Hypertension Hyperlipidemia   . Stroke [facility] Residual left sided hemiparesis.     History reviewed. No pertinent surgical history.   reports that she has never smoked. She has never used smokeless tobacco. She reports that she does not drink alcohol or use drugs.  Allergies  Allergen Reactions  . Lisinopril Cough    Family History  Problem Relation Age of Onset  . Hypertension Mother   . Hypertension Father   . Arthritis Father   . Stroke Paternal Aunt     Prior to Admission medications   Medication Sig Start Date End Date Taking? Authorizing Provider  hydrALAZINE (APRESOLINE) 100 MG tablet Take 100 mg by mouth every 8 (eight) hours.  07/21/16  Yes [provider]  hydrochlorothiazide (MICROZIDE) 12.5 MG capsule Take 12.5 mg by mouth daily. 07/21/16  Yes [provider]  labetalol (NORMODYNE)  200 MG tablet Take 400 mg by mouth every 12 (twelve) hours.  07/21/16  Yes [provider]    Physical Exam: Vitals:   07/26/16 1915 07/26/16 1945 07/26/16 2030 07/26/16 2040  BP: (!) 211/101 (!) 168/92 (!) 191/103 (!) 174/92  Pulse: 67 65 70 70  Resp:  (!) 22 18 19   Temp:      TempSrc:      SpO2: 95% 91% 96% 94%    Constitutional: NAD, calm, comfortable Eyes: PERRL, Unable to visualize retina, lids and conjunctivae normal ENMT: Mucous membranes are moist. Posterior pharynx clear of any exudate or lesions. Neck: normal, supple, no masses, no thyromegaly Respiratory: Clear to auscultation bilaterally, no wheezing, no crackles. Normal respiratory effort. No accessory muscle use.  Cardiovascular: Regular rate and rhythm, no murmurs / rubs / gallops. No extremity edema. 2+ pedal pulses. No carotid bruits.  Abdomen: Soft, no tenderness, no masses palpated. No hepatosplenomegaly. Bowel sounds positive.  Musculoskeletal: no clubbing / cyanosis. Good ROM, no contractures. Normal muscle tone.  Skin: no rashes, lesions, ulcers. No induration Neurologic: CN 2-12 grossly intact. Decreased sensation on left sided facial area and extremities, mild left patellar hyperreflexia. Strength 4/5 in left side. Psychiatric: Normal judgment and insight. Alert and oriented x 4. Normal mood.    Labs on Admission: I have personally reviewed following labs and imaging studies  CBC:  Recent Labs Lab 07/26/16 1723  WBC 10.8*  NEUTROABS 8.6*  HGB 12.7  HCT 40.8  MCV 84.8  PLT 238   Basic Metabolic Panel:  Recent Labs Lab 07/26/16 1723 07/26/16 1924  NA 138  --   K 3.4*  --   CL 99*  --   CO2 29  --   GLUCOSE 126*  --   BUN 23*  --   CREATININE 1.31*  --   CALCIUM 9.5  --   MG  --  1.8   GFR: CrCl cannot be calculated (Unknown ideal weight.). Liver Function Tests:  Recent Labs Lab 07/26/16 1723  AST 15  ALT 19  ALKPHOS 52  BILITOT 0.8  PROT 8.1  ALBUMIN 3.9   No results  for input(s): LIPASE, AMYLASE in the last 168 hours. No results for input(s): AMMONIA in the last 168 hours. Coagulation Profile: No results for input(s): INR, PROTIME in the last 168 hours. Cardiac Enzymes: No results for input(s): CKTOTAL, CKMB, CKMBINDEX, TROPONINI in the last 168 hours. BNP (last 3 results) No results for input(s): PROBNP in the last 8760 hours. HbA1C: No results for input(s): HGBA1C in the last 72 hours. CBG: No results for input(s): GLUCAP in the last 168 hours. Lipid Profile: No results for input(s): CHOL, HDL, LDLCALC, TRIG, CHOLHDL, LDLDIRECT in the last 72 hours. Thyroid Function Tests: No results for input(s): TSH, T4TOTAL, FREET4, T3FREE, THYROIDAB in the last 72 hours. Anemia Panel: No results for input(s): VITAMINB12, FOLATE, FERRITIN, TIBC, IRON, RETICCTPCT in the last 72 hours. Urine analysis: No results found for: COLORURINE, APPEARANCEUR, LABSPEC, PHURINE, GLUCOSEU, HGBUR, BILIRUBINUR, KETONESUR, PROTEINUR, UROBILINOGEN, NITRITE, LEUKOCYTESUR  Radiological Exams on Admission: Dg Chest 2 View  Result Date: 07/26/2016 CLINICAL DATA:  Cough EXAM: CHEST  2 VIEW COMPARISON:  08/17/2006 FINDINGS: Lungs are clear.  No pleural effusion or pneumothorax. The  heart is top-normal in size. Degenerative changes of the visualized thoracolumbar spine. IMPRESSION: No evidence of acute cardiopulmonary disease. Electronically Signed   By: Charline BillsSriyesh  Krishnan M.D.   On: 07/26/2016 18:08   Ct Head Wo Contrast  Result Date: 07/26/2016 CLINICAL DATA:  66 year old female with left lower foot numbness, left arm numbness, hypertension and recent intracranial hemorrhage. EXAM: CT HEAD WITHOUT CONTRAST TECHNIQUE: Contiguous axial images were obtained from the base of the skull through the vertex without intravenous contrast. COMPARISON:  06/24/2016 CT FINDINGS: Brain: There has been interval resolution of the right thalamic hemorrhage with evolutionary changes in this region. Chronic  small-vessel white matter ischemic changes are again identified. There is no evidence of acute infarct, hemorrhage, hydrocephalus, extra-axial collection or mass lesion. Vascular: No hyperdense vessel or unexpected calcification. Skull: Normal. Negative for fracture or focal lesion. Sinuses/Orbits: No acute finding. Other: None. IMPRESSION: No evidence of acute abnormality. Resolution of right thalamic hemorrhage with evolutionary changes in this area. Chronic small-vessel white matter ischemic changes Electronically Signed   By: Harmon PierJeffrey  Hu M.D.   On: 07/26/2016 18:04    EKG: Independently reviewed. Vent. rate 68 BPM PR interval * ms QRS duration 98 ms QT/QTc 451/480 ms P-R-T axes 23 33 76 Sinus rhythm  Assessment/Plan Principal Problem:   Hypertensive emergency Admit to ICU/inpatient. No further chest discomfort or epistaxis at this time.  No classic pheochromocytoma symptomatology triad. Continue nicardipine infusion. Discontinue lisinopril due to cough and substitute for ARB in AM. Resume all other antihypertensive agents in the morning. The patient was previously on amlodipine and clonidine per previous records. Alprazolam at bedtime for insomnia or anxiety. [facility]. Check renal ultrasound in AM to rule out RAS  Active Problems:   Chest discomfort I will trend troponin level. [facility].    Stroke [facility]) Had recent right thalamic hemorrhagic stroke with residual left-sided hemiparesis. CT scan of the brain today shows resolution of hemorrhage and evolutionary changes in this region.  Continue physical therapy while in the hospital. Blood pressure control.    Hypokalemia Replacing. Follow-up potassium level.  Check magnesium level and supplement as needed.    Hyperlipidemia Currently not on medication. Per previous records, the patient was on simvastatin before. Patient to  follow-up with PCP for long-term treatment and a strategy.     DVT prophylaxis: SCDs. Code Status: Full code. Family Communication: Her son and daughter are present in the knee. Disposition Plan: Admit to ICU to continue nicardipine infusion. Consults called:  Admission status: Inpatient/ICU.   Bobette Moavid Manuel Sheniece Ruggles MD [facility] Pager [phone].  If 7PM-7AM, please contact night-coverage [email or web address] Password [email or web address]  07/26/2016, 8:52 PM

## 2016-07-26 NOTE — ED Notes (Signed)
Pt returned from ct

## 2016-07-27 DIAGNOSIS — I161 Hypertensive emergency: Principal | ICD-10-CM

## 2016-07-27 LAB — URINALYSIS, ROUTINE W REFLEX MICROSCOPIC
Bilirubin Urine: NEGATIVE
GLUCOSE, UA: NEGATIVE mg/dL
HGB URINE DIPSTICK: NEGATIVE
Ketones, ur: NEGATIVE mg/dL
LEUKOCYTES UA: NEGATIVE
Nitrite: NEGATIVE
PH: 7 (ref 5.0–8.0)
Protein, ur: NEGATIVE mg/dL
Specific Gravity, Urine: 1.006 (ref 1.005–1.030)

## 2016-07-27 LAB — BASIC METABOLIC PANEL
Anion gap: 8 (ref 5–15)
BUN: 22 mg/dL — ABNORMAL HIGH (ref 6–20)
CHLORIDE: 102 mmol/L (ref 101–111)
CO2: 28 mmol/L (ref 22–32)
CREATININE: 1.22 mg/dL — AB (ref 0.44–1.00)
Calcium: 9.1 mg/dL (ref 8.9–10.3)
GFR calc non Af Amer: 46 mL/min — ABNORMAL LOW (ref >60)
GFR, EST AFRICAN AMERICAN: 53 mL/min — AB (ref >60)
Glucose, Bld: 117 mg/dL — ABNORMAL HIGH (ref 65–99)
Potassium: 3.7 mmol/L (ref 3.5–5.1)
SODIUM: 138 mmol/L (ref 135–145)

## 2016-07-27 LAB — CBC
HEMATOCRIT: 37.2 % (ref 36.0–46.0)
HEMOGLOBIN: 11.8 g/dL — AB (ref 12.0–15.0)
MCH: 26.9 pg (ref 26.0–34.0)
MCHC: 31.7 g/dL (ref 30.0–36.0)
MCV: 84.7 fL (ref 78.0–100.0)
PLATELETS: 233 10*3/uL (ref 150–400)
RBC: 4.39 MIL/uL (ref 3.87–5.11)
RDW: 14.9 % (ref 11.5–15.5)
WBC: 9.6 10*3/uL (ref 4.0–10.5)

## 2016-07-27 LAB — MRSA PCR SCREENING: MRSA by PCR: NEGATIVE

## 2016-07-27 LAB — TROPONIN I
Troponin I: 0.03 ng/mL (ref <0.03)
Troponin I: <0.03 ng/mL (ref <0.03)

## 2016-07-27 NOTE — Care Management Note (Signed)
Case Management Note  Patient Details  Name: Danielle Jordan MRN: [id number] Date of Birth: [birth year]  Subjective/Objective:  Adm with hypertensive emergency. [facility]. Patient does not have insurance but has applied for Medicaid. She has WC. Cane, RW, BSC and shower bench. [place] in [place] per [place]. Daughter lives with patient and assists with ADL's.                     Action/Plan: Patient and daughter aware that even when patient has Medicaid, it still will not pay for PT. Anticipate DC home with self care. May need a MATCH voucher.    Expected Discharge Date:     07/28/2016             Expected Discharge Plan:  Home/Self Care  In-House Referral:     Discharge planning Services  CM Consult  Post Acute Care Choice:    Choice offered to:     DME Arranged:    DME Agency:     HH Arranged:    HH Agency:     Status of Service:  In process, will continue to follow  If discussed at Long Length of Stay Meetings, dates discussed:    Additional Comments:  Ragna Kramlich, Chrystine OilerSharley Diane, RN 07/27/2016, 2:25 PM

## 2016-07-27 NOTE — Progress Notes (Signed)
PROGRESS NOTE    Danielle Jordan  ZOX:[id number] DOB: 26-Apr-1950 DOA: 07/26/2016 PCP: Patient, No Pcp Per     Brief Narrative:  66 y/o woman admitted from home on 6/13 due to epistaxis. Had a recent CVA with right-sided hemiparesis. In the ED was found to have a BP of 218/109, she was started on a nicardipine drip and admission was requested for a hypertensive emergency.   Assessment & Plan:   Principal Problem:   Hypertensive emergency Active Problems:   Stroke [facility])   Hypokalemia   Hyperlipidemia   Chest discomfort   Hypertensive Emergency -BP has normalized. -Nicardipine drip has ben discontinued overnight. -BP has been stable on home meds: HCTZ, labetalol. -Has been taken off lisinopril due to cough.  Recent CVA -With residual right hemiparesis.  Chest Pain -Resolved, troponin minimally elevated at 0.03, EKG without acute ischemic changes. -No further cardiac work up anticipated. -Likely related to malignant HTN.  Hypokalemia -Replaced. -Mg ok at 1.8.  Hyperlipidemia -Currently not on medications, unclear why. -Check lipids.   DVT prophylaxis: SCDs Code Status: full code Family Communication: son at bedside Disposition Plan: transfer to floor; anticipate DC home in 24 hours  Consultants:   None  Procedures:   None  Antimicrobials:  Anti-infectives    None       Subjective: Feels better, no CP, SOB, or lightheadedness  Objective: Vitals:   07/27/16 1300 07/27/16 1400 07/27/16 1426 07/27/16 1826  BP: 111/62  131/77 (!) 141/75  Pulse: 62 62 (!) 59 67  Resp: 20 (!) 22 20 19   Temp:   98.4 F (36.9 C) 98 F (36.7 C)  TempSrc:   Oral Oral  SpO2: 96% 95% 97% 98%  Weight:   94.7 kg (208 lb 12.8 oz)   Height:   5\' 2"  (1.575 m)     Intake/Output Summary (Last 24 hours) at 07/27/16 1842 Last data filed at 07/27/16 1700  Gross per 24 hour  Intake            867.5 ml  Output             2230 ml  Net          -1362.5 ml   Filed Weights   07/26/16 2135 07/27/16 0500 07/27/16 1426  Weight: 94.3 kg (207 lb 14.3 oz) 93.3 kg (205 lb 11 oz) 94.7 kg (208 lb 12.8 oz)    Examination:  General exam: Alert, awake, oriented x 3 Respiratory system: Clear to auscultation. Respiratory effort normal. Cardiovascular system:RRR. No murmurs, rubs, gallops. Gastrointestinal system: Abdomen is nondistended, soft and nontender. No organomegaly or masses felt. Normal bowel sounds heard. Central nervous system: Alert and oriented. Right hemiparesis from recent CVA Extremities: No C/C/E, +pedal pulses Skin: No rashes, lesions or ulcers Psychiatry: Judgement and insight appear normal. Mood & affect appropriate.     Data Reviewed: I have personally reviewed following labs and imaging studies  CBC:  Recent Labs Lab 07/26/16 1723 07/27/16 0726  WBC 10.8* 9.6  NEUTROABS 8.6*  --   HGB 12.7 11.8*  HCT 40.8 37.2  MCV 84.8 84.7  PLT 238 233   Basic Metabolic Panel:  Recent Labs Lab 07/26/16 1723 07/26/16 1924 07/27/16 0726  NA 138  --  138  K 3.4*  --  3.7  CL 99*  --  102  CO2 29  --  28  GLUCOSE 126*  --  117*  BUN 23*  --  22*  CREATININE 1.31*  --  1.22*  CALCIUM 9.5  --  9.1  MG  --  1.8  --    GFR: Estimated Creatinine Clearance: 50.6 mL/min (A) (by C-G formula based on SCr of 1.22 mg/dL (H)). Liver Function Tests:  Recent Labs Lab 07/26/16 1723  AST 15  ALT 19  ALKPHOS 52  BILITOT 0.8  PROT 8.1  ALBUMIN 3.9   No results for input(s): LIPASE, AMYLASE in the last 168 hours. No results for input(s): AMMONIA in the last 168 hours. Coagulation Profile: No results for input(s): INR, PROTIME in the last 168 hours. Cardiac Enzymes:  Recent Labs Lab 07/26/16 1924 07/27/16 0139 07/27/16 0726  TROPONINI <0.03 0.03* <0.03   BNP (last 3 results) No results for input(s): PROBNP in the last 8760 hours. HbA1C: No results for input(s): HGBA1C in the last 72 hours. CBG: No results for input(s): GLUCAP in the last  168 hours. Lipid Profile: No results for input(s): CHOL, HDL, LDLCALC, TRIG, CHOLHDL, LDLDIRECT in the last 72 hours. Thyroid Function Tests: No results for input(s): TSH, T4TOTAL, FREET4, T3FREE, THYROIDAB in the last 72 hours. Anemia Panel: No results for input(s): VITAMINB12, FOLATE, FERRITIN, TIBC, IRON, RETICCTPCT in the last 72 hours. Urine analysis:    Component Value Date/Time   COLORURINE STRAW (A) 07/27/2016 0545   APPEARANCEUR CLEAR 07/27/2016 0545   LABSPEC 1.006 07/27/2016 0545   PHURINE 7.0 07/27/2016 0545   GLUCOSEU NEGATIVE 07/27/2016 0545   HGBUR NEGATIVE 07/27/2016 0545   BILIRUBINUR NEGATIVE 07/27/2016 0545   KETONESUR NEGATIVE 07/27/2016 0545   PROTEINUR NEGATIVE 07/27/2016 0545   NITRITE NEGATIVE 07/27/2016 0545   LEUKOCYTESUR NEGATIVE 07/27/2016 0545   Sepsis Labs: @LABRCNTIP (procalcitonin:4,lacticidven:4)  ) Recent Results (from the past 240 hour(s))  MRSA PCR Screening     Status: None   Collection Time: 07/26/16  9:23 PM  Result Value Ref Range Status   MRSA by PCR NEGATIVE NEGATIVE Final    Comment:        The GeneXpert MRSA Assay (FDA approved for NASAL specimens only), is one component of a comprehensive MRSA colonization surveillance program. It is not intended to diagnose MRSA infection nor to guide or monitor treatment for MRSA infections.          Radiology Studies: Dg Chest 2 View  Result Date: 07/26/2016 CLINICAL DATA:  Cough EXAM: CHEST  2 VIEW COMPARISON:  08/17/2006 FINDINGS: Lungs are clear.  No pleural effusion or pneumothorax. The heart is top-normal in size. Degenerative changes of the visualized thoracolumbar spine. IMPRESSION: No evidence of acute cardiopulmonary disease. Electronically Signed   By: Charline BillsSriyesh  Krishnan M.D.   On: 07/26/2016 18:08   Ct Head Wo Contrast  Result Date: 07/26/2016 CLINICAL DATA:  66 year old female with left lower foot numbness, left arm numbness, hypertension and recent intracranial  hemorrhage. EXAM: CT HEAD WITHOUT CONTRAST TECHNIQUE: Contiguous axial images were obtained from the base of the skull through the vertex without intravenous contrast. COMPARISON:  06/24/2016 CT FINDINGS: Brain: There has been interval resolution of the right thalamic hemorrhage with evolutionary changes in this region. Chronic small-vessel white matter ischemic changes are again identified. There is no evidence of acute infarct, hemorrhage, hydrocephalus, extra-axial collection or mass lesion. Vascular: No hyperdense vessel or unexpected calcification. Skull: Normal. Negative for fracture or focal lesion. Sinuses/Orbits: No acute finding. Other: None. IMPRESSION: No evidence of acute abnormality. Resolution of right thalamic hemorrhage with evolutionary changes in this area. Chronic small-vessel white matter ischemic changes Electronically Signed   By: Henrietta HooverJeffrey  Hu M.D.  On: 07/26/2016 18:04        Scheduled Meds: . hydrALAZINE  100 mg Oral Q8H  . hydrochlorothiazide  12.5 mg Oral Daily  . labetalol  400 mg Oral Q12H   Continuous Infusions: . niCARDipine Stopped (07/27/16 0300)     LOS: 1 day    Time spent: 30 minutes. Greater than 50% of this time was spent in direct contact with the patient coordinating care.     Chaya Jan, MD [facility] Pager [phone]  If 7PM-7AM, please contact night-coverage [email or web address] Password [email or web address] 07/27/2016, 6:42 PM

## 2016-07-28 DIAGNOSIS — E784 Other hyperlipidemia: Secondary | ICD-10-CM

## 2016-07-28 LAB — CBC
HEMATOCRIT: 35.7 % — AB (ref 36.0–46.0)
HEMOGLOBIN: 11.2 g/dL — AB (ref 12.0–15.0)
MCH: 26.6 pg (ref 26.0–34.0)
MCHC: 31.4 g/dL (ref 30.0–36.0)
MCV: 84.8 fL (ref 78.0–100.0)
Platelets: 231 10*3/uL (ref 150–400)
RBC: 4.21 MIL/uL (ref 3.87–5.11)
RDW: 15 % (ref 11.5–15.5)
WBC: 8.8 10*3/uL (ref 4.0–10.5)

## 2016-07-28 LAB — BASIC METABOLIC PANEL
ANION GAP: 8 (ref 5–15)
BUN: 25 mg/dL — ABNORMAL HIGH (ref 6–20)
CHLORIDE: 101 mmol/L (ref 101–111)
CO2: 29 mmol/L (ref 22–32)
Calcium: 9 mg/dL (ref 8.9–10.3)
Creatinine, Ser: 1.28 mg/dL — ABNORMAL HIGH (ref 0.44–1.00)
GFR calc Af Amer: 50 mL/min — ABNORMAL LOW (ref >60)
GFR, EST NON AFRICAN AMERICAN: 44 mL/min — AB (ref >60)
GLUCOSE: 105 mg/dL — AB (ref 65–99)
POTASSIUM: 3.5 mmol/L (ref 3.5–5.1)
Sodium: 138 mmol/L (ref 135–145)

## 2016-07-28 LAB — HIV ANTIBODY (ROUTINE TESTING W REFLEX): HIV Screen 4th Generation wRfx: NONREACTIVE

## 2016-07-28 MED ORDER — LABETALOL HCL 200 MG PO TABS: 400.0000 mg | ORAL_TABLET | Freq: Two times a day (BID) | ORAL | 2 refills | Status: DC

## 2016-07-28 MED ORDER — HYDROCHLOROTHIAZIDE 12.5 MG PO CAPS: 25.0000 mg | ORAL_CAPSULE | Freq: Every day | ORAL | 2 refills | Status: DC

## 2016-07-28 NOTE — Progress Notes (Signed)
Patient IV removed, telemetry removed, tolerated well. Patient and family given discharge instructions at bedside.  

## 2016-07-28 NOTE — Discharge Summary (Signed)
Physician Discharge Summary  Danielle Jordan YQM:[id number] DOB: 1951-01-22 DOA: 07/26/2016  PCP: Patient, No Pcp Per  Admit date: 07/26/2016 Discharge date: 07/28/2016  Time spent: 45 minutes  Recommendations for Outpatient Follow-up:  -Will be discharged home today. -Advised to follow up with PCP in 2 weeks for continued BP management.   Discharge Diagnoses:  Principal Problem:   Hypertensive emergency Active Problems:   Stroke [facility])   Hypokalemia   Hyperlipidemia   Chest discomfort   Discharge Condition: Stable and improved  Filed Weights   07/26/16 2135 07/27/16 0500 07/27/16 1426  Weight: 94.3 kg (207 lb 14.3 oz) 93.3 kg (205 lb 11 oz) 94.7 kg (208 lb 12.8 oz)    History of present illness:  As per Dr. Robb Matar on 6/13: Danielle Jordan is a 66 y.o. female with medical history significant of hypertension, who was just discharged from [facility] on Friday for a recent right thalamic region hemorrhagic CVA with residual right-sided hemiparesis was coming to the emergency department with complaints of epistaxis and hemoptysis following "a bad coughing spell", which she and her relatives think that is related to the patient taking lisinopril 40 mg by mouth daily for high blood pressure. Her relatives stated that her systolic blood pressure has been consistently in the 180s to 190s and her diastolic in the 110s to 120s range. She has been taking hydralazine 100 mg by mouth 3 times a day, labetalol 400 mg by mouth twice a day, hydrochlorothiazide 12.5 mg by mouth daily plus the lisinopril without significant reduction of her BP numbers. She denies dietary indiscretions and does not add salt to meals.  They also mentioned that the patient is also complaining of mild chest discomfort, which is nonradiating, does not last long and denies associated dyspnea, palpitations, diaphoresis, dizziness, nausea or emesis. She complains of mild lower extremity edema on occasion. She denies headache,  fever, chills, but feels fatigued. He denies abdominal pain, diarrhea, constipation, melena, hemorrhoids or hematochezia. She denies dysuria, frequency or hematuria. She denies anxiety, depression or insomnia. She denies skin rashes.  Previous records, showed that she had a cardiac cath in 2012 that did not show renal artery stenosis and her workup was negative for pheochromocytoma that time. Last month, she had a echocardiogram, but I am unable to see those results on the care everywhere tab at this time.  ED Course: The patient's initial blood pressure was 218/109 mmHg, her pulse 84 BPM, respirations 18/m and O2 sat 95% on room air. She was given labetalol 40 mg IVP which transiently lower the blood pressure, then a second dose of labetalol 80 mg IVP was given, but the patient was subsequently started on a nicardipine infusion.   Hospital Course:   Hypertensive Emergency -BP has normalized. -Nicardipine drip has ben discontinued over 24 hours ago -BP has been stable on home meds: HCTZ, labetalol. -Has been taken off lisinopril due to cough.  Recent CVA -With residual right hemiparesis.  Chest Pain -Resolved, troponin minimally elevated at 0.03, EKG without acute ischemic changes. -No further cardiac work up anticipated. -Likely related to malignant HTN.  Hypokalemia -Replaced. -Mg ok at 1.8.  Hyperlipidemia -Currently not on medications, unclear why. -OP follow up for determination.  Procedures:  None   Consultations:  None  Discharge Instructions  Discharge Instructions    Diet - low sodium heart healthy    Complete by:  As directed    Increase activity slowly    Complete by:  As directed  Allergies as of 07/28/2016      Reactions   Lisinopril Cough      Medication List    TAKE these medications   hydrALAZINE 100 MG tablet Commonly known as:  APRESOLINE Take 100 mg by mouth every 8 (eight) hours.   hydrochlorothiazide 12.5 MG capsule Commonly  known as:  MICROZIDE Take 2 capsules (25 mg total) by mouth daily. What changed:  how much to take   labetalol 200 MG tablet Commonly known as:  NORMODYNE Take 2 tablets (400 mg total) by mouth 2 (two) times daily. What changed:  when to take this      Allergies  Allergen Reactions  . Lisinopril Cough   Follow-up Information    regular PCP. Schedule an appointment as soon as possible for a visit in 2 week(s).            The results of significant diagnostics from this hospitalization (including imaging, microbiology, ancillary and laboratory) are listed below for reference.    Significant Diagnostic Studies: Dg Chest 2 View  Result Date: 07/26/2016 CLINICAL DATA:  Cough EXAM: CHEST  2 VIEW COMPARISON:  08/17/2006 FINDINGS: Lungs are clear.  No pleural effusion or pneumothorax. The heart is top-normal in size. Degenerative changes of the visualized thoracolumbar spine. IMPRESSION: No evidence of acute cardiopulmonary disease. Electronically Signed   By: Charline BillsSriyesh  Krishnan M.D.   On: 07/26/2016 18:08   Ct Head Wo Contrast  Result Date: 07/26/2016 CLINICAL DATA:  [age] year old female with left lower foot numbness, left arm numbness, hypertension and recent intracranial hemorrhage. EXAM: CT HEAD WITHOUT CONTRAST TECHNIQUE: Contiguous axial images were obtained from the base of the skull through the vertex without intravenous contrast. COMPARISON:  06/24/2016 CT FINDINGS: Brain: There has been interval resolution of the right thalamic hemorrhage with evolutionary changes in this region. Chronic small-vessel white matter ischemic changes are again identified. There is no evidence of acute infarct, hemorrhage, hydrocephalus, extra-axial collection or mass lesion. Vascular: No hyperdense vessel or unexpected calcification. Skull: Normal. Negative for fracture or focal lesion. Sinuses/Orbits: No acute finding. Other: None. IMPRESSION: No evidence of acute abnormality. Resolution of right thalamic  hemorrhage with evolutionary changes in this area. Chronic small-vessel white matter ischemic changes Electronically Signed   By: Harmon PierJeffrey  Hu M.D.   On: 07/26/2016 18:04    Microbiology: Recent Results (from the past 240 hour(s))  MRSA PCR Screening     Status: None   Collection Time: 07/26/16  9:23 PM  Result Value Ref Range Status   MRSA by PCR NEGATIVE NEGATIVE Final    Comment:        The GeneXpert MRSA Assay (FDA approved for NASAL specimens only), is one component of a comprehensive MRSA colonization surveillance program. It is not intended to diagnose MRSA infection nor to guide or monitor treatment for MRSA infections.      Labs: Basic Metabolic Panel:  Recent Labs Lab 07/26/16 1723 07/26/16 1924 07/27/16 0726 07/28/16 0614  NA 138  --  138 138  K 3.4*  --  3.7 3.5  CL 99*  --  102 101  CO2 29  --  28 29  GLUCOSE 126*  --  117* 105*  BUN 23*  --  22* 25*  CREATININE 1.31*  --  1.22* 1.28*  CALCIUM 9.5  --  9.1 9.0  MG  --  1.8  --   --    Liver Function Tests:  Recent Labs Lab 07/26/16 1723  AST 15  ALT 19  ALKPHOS 52  BILITOT 0.8  PROT 8.1  ALBUMIN 3.9   No results for input(s): LIPASE, AMYLASE in the last 168 hours. No results for input(s): AMMONIA in the last 168 hours. CBC:  Recent Labs Lab 07/26/16 1723 07/27/16 0726 07/28/16 0614  WBC 10.8* 9.6 8.8  NEUTROABS 8.6*  --   --   HGB 12.7 11.8* 11.2*  HCT 40.8 37.2 35.7*  MCV 84.8 84.7 84.8  PLT 238 233 231   Cardiac Enzymes:  Recent Labs Lab 07/26/16 1924 07/27/16 0139 07/27/16 0726  TROPONINI <0.03 0.03* <0.03   BNP: BNP (last 3 results) No results for input(s): BNP in the last 8760 hours.  ProBNP (last 3 results) No results for input(s): PROBNP in the last 8760 hours.  CBG: No results for input(s): GLUCAP in the last 168 hours.     SignedChaya Jan  [facility] Pager: [phone] 07/28/2016, 12:11 PM

## 2016-07-29 ENCOUNTER — Emergency Department (HOSPITAL_COMMUNITY)
Admission: EM | Admit: 2016-07-29 | Discharge: 2016-07-29 | Disposition: A | Discharge status: Home / Self Care | Payer: Self-pay | Attending: Emergency Medicine | Admitting: Emergency Medicine

## 2016-07-29 ENCOUNTER — Encounter (HOSPITAL_COMMUNITY): Payer: Self-pay

## 2016-07-29 DIAGNOSIS — I10 Essential (primary) hypertension: Secondary | ICD-10-CM | POA: Insufficient documentation

## 2016-07-29 DIAGNOSIS — Z79899 Other long term (current) drug therapy: Secondary | ICD-10-CM | POA: Insufficient documentation

## 2016-07-29 LAB — CBC WITH DIFFERENTIAL/PLATELET
BASOS PCT: 0 %
Basophils Absolute: 0 10*3/uL (ref 0.0–0.1)
EOS ABS: 0.2 10*3/uL (ref 0.0–0.7)
Eosinophils Relative: 2 %
HCT: 37.1 % (ref 36.0–46.0)
Hemoglobin: 11.9 g/dL — ABNORMAL LOW (ref 12.0–15.0)
Lymphocytes Relative: 21 %
Lymphs Abs: 2 10*3/uL (ref 0.7–4.0)
MCH: 27 pg (ref 26.0–34.0)
MCHC: 32.1 g/dL (ref 30.0–36.0)
MCV: 84.3 fL (ref 78.0–100.0)
MONO ABS: 0.4 10*3/uL (ref 0.1–1.0)
MONOS PCT: 4 %
NEUTROS PCT: 73 %
Neutro Abs: 7.1 10*3/uL (ref 1.7–7.7)
Platelets: 238 10*3/uL (ref 150–400)
RBC: 4.4 MIL/uL (ref 3.87–5.11)
RDW: 14.7 % (ref 11.5–15.5)
WBC: 9.6 10*3/uL (ref 4.0–10.5)

## 2016-07-29 LAB — COMPREHENSIVE METABOLIC PANEL
ALK PHOS: 55 U/L (ref 38–126)
ALT: 14 U/L (ref 14–54)
AST: 13 U/L — ABNORMAL LOW (ref 15–41)
Albumin: 3.9 g/dL (ref 3.5–5.0)
Anion gap: 10 (ref 5–15)
BILIRUBIN TOTAL: 0.7 mg/dL (ref 0.3–1.2)
BUN: 25 mg/dL — ABNORMAL HIGH (ref 6–20)
CALCIUM: 9.4 mg/dL (ref 8.9–10.3)
CO2: 29 mmol/L (ref 22–32)
CREATININE: 1.22 mg/dL — AB (ref 0.44–1.00)
Chloride: 102 mmol/L (ref 101–111)
GFR calc non Af Amer: 46 mL/min — ABNORMAL LOW (ref >60)
GFR, EST AFRICAN AMERICAN: 53 mL/min — AB (ref >60)
Glucose, Bld: 132 mg/dL — ABNORMAL HIGH (ref 65–99)
Potassium: 3.5 mmol/L (ref 3.5–5.1)
SODIUM: 141 mmol/L (ref 135–145)
Total Protein: 7.7 g/dL (ref 6.5–8.1)

## 2016-07-29 MED ORDER — CLONIDINE HCL 0.1 MG PO TABS: ORAL_TABLET | ORAL | 0 refills | Status: DC

## 2016-07-29 MED ORDER — CLONIDINE HCL 0.2 MG PO TABS
0.2000 mg | ORAL_TABLET | Freq: Once | ORAL | Status: AC
  Administered 2016-07-29: 0.2 mg via ORAL
  Filled 2016-07-29: qty 1

## 2016-07-29 NOTE — ED Triage Notes (Signed)
Pt was just discharged from inpatient here at [facility] approx noon on Friday. Pt had been admitted for hypertension issues and some changes to her meds were made although pt is not sure which one.  Pt denies complaints, just states her bp was elevated at home 200/130

## 2016-07-29 NOTE — ED Notes (Signed)
Pt c/o face numbness and chest tightness, edp notifed and orders received.

## 2016-07-29 NOTE — Discharge Instructions (Signed)
Please continue to monitor your blood pressure. If your blood pressure is over 200 systolic you can take the clonidine, and repeat in 1 hour if it isn't helping.  Please document when you take the clonidine so your doctor can decide if you need to have your blood pressure medications adjusted. Return to the ED if you get headache, chest pain, facial numbness or you seem worse.

## 2016-07-29 NOTE — ED Provider Notes (Addendum)
[facility]-EM[facility]Y DEPT Provider Note   CSN: [id number] Arrival date & time: 07/29/16  0022  Time seen 01:45 AM   History   Chief Complaint Chief Complaint  Patient presents with  . Hypertension    HPI Danielle Jordan is a 66 y.o. female.  HPI  patient reports she had a stroke about one month ago, May 12 and was discharged from [facility] on June 8. She was readmitted on June 13 with hypertensive crisis and was discharged on June 15 at noon. She reports she was feeling fine. She checked her blood pressure about 11:30 PM and her blood pressure was 193/116 and then went up to 210/133. She had taken her blood pressure medicine at 9 PM, "two white pills and one orange pill". Presumably this was her hydralazine and labetalol. She denies headache, blurred vision, nausea, vomiting, chest pain, or shortness of breath. She states she has chronic left-sided numbness since her stroke and when she walks she needs physical help. Her next appointment at [facility] is July 21.  PCP [facility]  Past Medical History:  Diagnosis Date  . Hyperlipidemia   . Hypertension   . Stroke [facility])     Patient Active Problem List   Diagnosis Date Noted  . Hypertensive emergency 07/26/2016  . Stroke (HCC) 07/26/2016  . Hypokalemia 07/26/2016  . Hyperlipidemia 07/26/2016  . Chest discomfort 07/26/2016    History reviewed. No pertinent surgical history.  OB History    No data available       Home Medications    Prior to Admission medications   Medication Sig Start Date End Date Taking? Authorizing Provider  hydrALAZINE (APRESOLINE) 100 MG tablet Take 100 mg by mouth every 8 (eight) hours.  07/21/16  Yes [provider]  hydrochlorothiazide (MICROZIDE) 12.5 MG capsule Take 2 capsules (25 mg total) by mouth daily. 07/28/16  Yes Philip Aspen, Limmie Patricia, MD  labetalol (NORMODYNE) 200 MG tablet Take 2 tablets (400 mg total) by mouth 2 (two) times daily. 07/28/16  Yes Philip Aspen,  Limmie Patricia, MD  cloNIDine (CATAPRES) 0.1 MG tablet Take 1 po for BP over 200 systolic, if BP is not improved in 1 hour may repeat 07/29/16   Devoria Albe, MD    Family History Family History  Problem Relation Age of Onset  . Hypertension Mother   . Hypertension Father   . Arthritis Father   . Stroke Paternal Aunt     Social History Social History  Substance Use Topics  . Smoking status: Never Smoker  . Smokeless tobacco: Never Used  . Alcohol use No  needs assistance to walk   Allergies   Lisinopril   Review of Systems Review of Systems  All other systems reviewed and are negative.    Physical Exam Updated Vital Signs ED Triage Vitals  Enc Vitals Group     BP 07/29/16 0033 (!) 187/104     Pulse Rate 07/29/16 0033 72     Resp 07/29/16 0033 18     Temp 07/29/16 0033 98.7 F (37.1 C)     Temp Source 07/29/16 0033 Oral     SpO2 07/29/16 0033 94 %     Weight --      Height --      Head Circumference --      Peak Flow --      Pain Score 07/29/16 0028 0     Pain Loc --      Pain Edu? --  Excl. in GC? --    Vital signs normal except hypertension   Physical Exam  Constitutional: She is oriented to person, place, and time. She appears well-developed and well-nourished.  Non-toxic appearance. She does not appear ill. No distress.  HENT:  Head: Normocephalic and atraumatic.  Right Ear: External ear normal.  Left Ear: External ear normal.  Nose: Nose normal. No mucosal edema or rhinorrhea.  Mouth/Throat: Oropharynx is clear and moist and mucous membranes are normal. No dental abscesses or uvula swelling.  Eyes: Conjunctivae and EOM are normal. Pupils are equal, round, and reactive to light.  Neck: Normal range of motion and full passive range of motion without pain. Neck supple.  Cardiovascular: Normal rate, regular rhythm and normal heart sounds.  Exam reveals no gallop and no friction rub.   No murmur heard. Pulmonary/Chest: Effort normal and breath sounds  normal. No respiratory distress. She has no wheezes. She has no rhonchi. She has no rales. She exhibits no tenderness and no crepitus.  Abdominal: Normal appearance.  Musculoskeletal: Normal range of motion. She exhibits no edema or tenderness.  Moves all extremities well.   Neurological: She is alert and oriented to person, place, and time. She has normal strength. No cranial nerve deficit.  Except Mild left facial droop  Skin: Skin is warm, dry and intact. No rash noted. No erythema. No pallor.  Psychiatric: She has a normal mood and affect. Her speech is normal and behavior is normal. Her mood appears not anxious.  Nursing note and vitals reviewed.    ED Treatments / Results  Labs (all labs ordered are listed, but only abnormal results are displayed) Results for orders placed or performed during the hospital encounter of 07/29/16  Comprehensive metabolic panel  Result Value Ref Range   Sodium 141 135 - 145 mmol/L   Potassium 3.5 3.5 - 5.1 mmol/L   Chloride 102 101 - 111 mmol/L   CO2 29 22 - 32 mmol/L   Glucose, Bld 132 (H) 65 - 99 mg/dL   BUN 25 (H) 6 - 20 mg/dL   Creatinine, Ser 6.571.22 (H) 0.44 - 1.00 mg/dL   Calcium 9.4 8.9 - 84.610.3 mg/dL   Total Protein 7.7 6.5 - 8.1 g/dL   Albumin 3.9 3.5 - 5.0 g/dL   AST 13 (L) 15 - 41 U/L   ALT 14 14 - 54 U/L   Alkaline Phosphatase 55 38 - 126 U/L   Total Bilirubin 0.7 0.3 - 1.2 mg/dL   GFR calc non Af Amer 46 (L) >60 mL/min   GFR calc Af Amer 53 (L) >60 mL/min   Anion gap 10 5 - 15  CBC with Differential  Result Value Ref Range   WBC 9.6 4.0 - 10.5 K/uL   RBC 4.40 3.87 - 5.11 MIL/uL   Hemoglobin 11.9 (L) 12.0 - 15.0 g/dL   HCT 96.237.1 95.236.0 - 84.146.0 %   MCV 84.3 78.0 - 100.0 fL   MCH 27.0 26.0 - 34.0 pg   MCHC 32.1 30.0 - 36.0 g/dL   RDW 32.414.7 40.111.5 - 02.715.5 %   Platelets 238 150 - 400 K/uL   Neutrophils Relative % 73 %   Neutro Abs 7.1 1.7 - 7.7 K/uL   Lymphocytes Relative 21 %   Lymphs Abs 2.0 0.7 - 4.0 K/uL   Monocytes Relative 4 %    Monocytes Absolute 0.4 0.1 - 1.0 K/uL   Eosinophils Relative 2 %   Eosinophils Absolute 0.2 0.0 - 0.7 K/uL  Basophils Relative 0 %   Basophils Absolute 0.0 0.0 - 0.1 K/uL   Laboratory interpretation all normal except Mild anemia, renal insufficiency which is stable   EKG  EKG Interpretation  Date/Time:  Saturday July 29 2016 02:55:46 EDT Ventricular Rate:  69 PR Interval:    QRS Duration: 103 QT Interval:  444 QTC Calculation: 476 R Axis:   34 Text Interpretation:  Sinus rhythm Left ventricular hypertrophy Baseline wander No significant change since last tracing 26 Jul 2016 Confirmed by Devoria Albe ([id number]) on 07/29/2016 3:36:30 AM       Radiology No results found.  Procedures Procedures (including critical care time)  Medications Ordered in ED Medications  cloNIDine (CATAPRES) tablet 0.2 mg (0.2 mg Oral Given 07/29/16 0306)     Initial Impression / Assessment and Plan / ED Course  I have reviewed the triage vital signs and the nursing notes.  Pertinent labs & imaging results that were available during my care of the patient were reviewed by me and considered in my medical decision making (see chart for details).  Patient's blood pressure improved to 157/94 without treatment. We discussed we would monitor a little bit longer and see how her blood pressure does and hopefully she could be discharged home.  2:50 AM nurse reports patient's blood pressure has jumped up to  213/113 and the patient is now complaining of a left facial numbness and some chest pain. Labs were ordered, IV inserted, it appears when she was admitted recently the labetalol IV did not help her blood pressure well, she was given clonidine 0.2 mg orally.  Recheck at 3:40 AM patient's blood pressure is 169/97 after the clonidine. She states the facial numbness is gone and the chest discomfort is gone. She is feeling much improved. We discussed watching her a little bit longer just to make sure her blood  pressure does not spike up again. I am going to discharge her home with clonidine 0.2 mg tablets that she can use when necessary if her blood pressure gets up over 200 at home. She is advised to keep a log as to how often she is using it so her doctor can decide if she needs something in addition to her usual blood pressure medication if she's taking the clonidine daily, or if  she is only using it now and then they may keep her on it as a when necessary basis.  Patient was given the clonidine around 3 AM. At 4:15 AM her blood pressure was 111/66, and at 4:30 her blood pressure was 104/67. This is a little bit lower than I wanted however she is sleeping.  Pt was awakened and her BP was 153/81  Final Clinical Impressions(s) / ED Diagnoses   Final diagnoses:  Essential hypertension    New Prescriptions New Prescriptions   CLONIDINE (CATAPRES) 0.1 MG TABLET    Take 1 po for BP over 200 systolic, if BP is not improved in 1 hour may repeat    Plan discharge  Devoria Albe, MD, Concha Pyo, MD 07/29/16 Curtis Sites    Devoria Albe, MD 08/09/16 2257

## 2016-10-11 ENCOUNTER — Encounter (HOSPITAL_COMMUNITY): Payer: Self-pay

## 2016-10-11 ENCOUNTER — Emergency Department (HOSPITAL_COMMUNITY)
Admission: EM | Admit: 2016-10-11 | Discharge: 2016-10-11 | Disposition: A | Discharge status: Home / Self Care | Payer: BLUE CROSS/BLUE SHIELD | Attending: Emergency Medicine | Admitting: Emergency Medicine

## 2016-10-11 DIAGNOSIS — I10 Essential (primary) hypertension: Secondary | ICD-10-CM | POA: Diagnosis not present

## 2016-10-11 DIAGNOSIS — E860 Dehydration: Secondary | ICD-10-CM | POA: Insufficient documentation

## 2016-10-11 DIAGNOSIS — Z79899 Other long term (current) drug therapy: Secondary | ICD-10-CM | POA: Diagnosis not present

## 2016-10-11 DIAGNOSIS — R197 Diarrhea, unspecified: Secondary | ICD-10-CM | POA: Insufficient documentation

## 2016-10-11 LAB — COMPREHENSIVE METABOLIC PANEL
ALBUMIN: 3.8 g/dL (ref 3.5–5.0)
ALK PHOS: 39 U/L (ref 38–126)
ALT: 17 U/L (ref 14–54)
AST: 32 U/L (ref 15–41)
Anion gap: 12 (ref 5–15)
BILIRUBIN TOTAL: 0.4 mg/dL (ref 0.3–1.2)
BUN: 34 mg/dL — AB (ref 6–20)
CO2: 22 mmol/L (ref 22–32)
CREATININE: 1.61 mg/dL — AB (ref 0.44–1.00)
Calcium: 8.3 mg/dL — ABNORMAL LOW (ref 8.9–10.3)
Chloride: 89 mmol/L — ABNORMAL LOW (ref 101–111)
GFR calc Af Amer: 38 mL/min — ABNORMAL LOW (ref >60)
GFR calc non Af Amer: 33 mL/min — ABNORMAL LOW (ref >60)
GLUCOSE: 157 mg/dL — AB (ref 65–99)
POTASSIUM: 2.8 mmol/L — AB (ref 3.5–5.1)
Sodium: 123 mmol/L — ABNORMAL LOW (ref 135–145)
TOTAL PROTEIN: 7.3 g/dL (ref 6.5–8.1)

## 2016-10-11 LAB — URINALYSIS, ROUTINE W REFLEX MICROSCOPIC
Bilirubin Urine: NEGATIVE
GLUCOSE, UA: NEGATIVE mg/dL
Hgb urine dipstick: NEGATIVE
Ketones, ur: NEGATIVE mg/dL
Nitrite: NEGATIVE
SPECIFIC GRAVITY, URINE: 1.011 (ref 1.005–1.030)
pH: 5 (ref 5.0–8.0)

## 2016-10-11 LAB — CBC WITH DIFFERENTIAL/PLATELET
BASOS ABS: 0 10*3/uL (ref 0.0–0.1)
Basophils Relative: 0 %
EOS PCT: 0 %
Eosinophils Absolute: 0 10*3/uL (ref 0.0–0.7)
HEMATOCRIT: 38.9 % (ref 36.0–46.0)
Hemoglobin: 13.1 g/dL (ref 12.0–15.0)
Lymphocytes Relative: 20 %
Lymphs Abs: 1.1 10*3/uL (ref 0.7–4.0)
MCH: 26.5 pg (ref 26.0–34.0)
MCHC: 33.7 g/dL (ref 30.0–36.0)
MCV: 78.6 fL (ref 78.0–100.0)
MONOS PCT: 7 %
Monocytes Absolute: 0.4 10*3/uL (ref 0.1–1.0)
Neutro Abs: 3.8 10*3/uL (ref 1.7–7.7)
Neutrophils Relative %: 73 %
Platelets: 124 10*3/uL — ABNORMAL LOW (ref 150–400)
RBC: 4.95 MIL/uL (ref 3.87–5.11)
RDW: 14.1 % (ref 11.5–15.5)
WBC: 5.3 10*3/uL (ref 4.0–10.5)

## 2016-10-11 MED ORDER — POTASSIUM CHLORIDE CRYS ER 20 MEQ PO TBCR
40.0000 meq | EXTENDED_RELEASE_TABLET | Freq: Once | ORAL | Status: AC
  Administered 2016-10-11: 40 meq via ORAL
  Filled 2016-10-11: qty 2

## 2016-10-11 MED ORDER — DIPHENOXYLATE-ATROPINE 2.5-0.025 MG PO TABS: 1.0000 | ORAL_TABLET | Freq: Four times a day (QID) | ORAL | 0 refills | Status: DC | PRN

## 2016-10-11 MED ORDER — SODIUM CHLORIDE 0.9 % IV BOLUS (SEPSIS)
1000.0000 mL | Freq: Once | INTRAVENOUS | Status: AC
  Administered 2016-10-11: 1000 mL via INTRAVENOUS

## 2016-10-11 NOTE — ED Triage Notes (Signed)
Pt reports diarrhea x 1 week.  Denies any rectal bleeding.  Denies any pain, n/v.

## 2016-10-11 NOTE — Discharge Instructions (Signed)
Drink plenty of fluids.  See your Physician for recheck.   Return if symptoms persist

## 2016-10-11 NOTE — ED Notes (Signed)
Pt still is not able to give stool sample at this time. States, "I don't have to poop now."

## 2016-10-11 NOTE — ED Provider Notes (Signed)
[facility]ER[facility] DEPT Provider Note   CSN: [id number] Arrival date & time: 10/11/16  0017     History   Chief Complaint Chief Complaint  Patient presents with  . Diarrhea    HPI Danielle Jordan is a 66 y.o. female.  The history is provided by the patient. No language interpreter was used.  Diarrhea   This is a new problem. The current episode started more than 1 week ago. The problem occurs 2 to 4 times per day. The problem has not changed since onset.The stool consistency is described as watery. There has been no fever. She has tried nothing for the symptoms. Her past medical history does not include inflammatory bowel disease or bowel resection.  Pt reports approx 4 loose bms a day for over a week.  Pt complains of feeling weak.  Pt denies fever or chills.  No vomitting.  Pt has not had any exposure to illness or any suspicious foods.   Past Medical History:  Diagnosis Date  . Hyperlipidemia   . Hypertension   . Stroke [facility])     Patient Active Problem List   Diagnosis Date Noted  . Hypertensive emergency 07/26/2016  . Stroke (HCC) 07/26/2016  . Hypokalemia 07/26/2016  . Hyperlipidemia 07/26/2016  . Chest discomfort 07/26/2016    History reviewed. No pertinent surgical history.  OB History    No data available       Home Medications    Prior to Admission medications   Medication Sig Start Date End Date Taking? Authorizing Provider  amLODipine (NORVASC) 10 MG tablet Take 10 mg by mouth daily. 08/21/16  Yes [provider]  chlorthalidone (HYGROTON) 25 MG tablet Take 25 mg by mouth daily. 09/04/16  Yes [provider]  cloNIDine (CATAPRES) 0.1 MG tablet Take 1 po for BP over 200 systolic, if BP is not improved in 1 hour may repeat 07/29/16  Yes Devoria Albe, MD  hydrALAZINE (APRESOLINE) 100 MG tablet Take 100 mg by mouth every 8 (eight) hours.  07/21/16  Yes [provider]  labetalol (NORMODYNE) 200 MG tablet Take 2 tablets (400 mg total) by mouth  2 (two) times daily. 07/28/16  Yes Philip Aspen, Limmie Patricia, MD  spironolactone (ALDACTONE) 25 MG tablet Take 25 mg by mouth daily. 09/04/16  Yes [provider]  diphenoxylate-atropine (LOMOTIL) 2.5-0.025 MG tablet Take 1 tablet by mouth 4 (four) times daily as needed for diarrhea or loose stools. 10/11/16   Elson Areas, PA-C  hydrochlorothiazide (MICROZIDE) 12.5 MG capsule Take 2 capsules (25 mg total) by mouth daily. 07/28/16   Henderson Cloud, MD    Family History Family History  Problem Relation Age of Onset  . Hypertension Mother   . Hypertension Father   . Arthritis Father   . Stroke Paternal Aunt     Social History Social History  Substance Use Topics  . Smoking status: Never Smoker  . Smokeless tobacco: Never Used  . Alcohol use No     Allergies   Lisinopril   Review of Systems Review of Systems  Gastrointestinal: Positive for diarrhea.  All other systems reviewed and are negative.    Physical Exam Updated Vital Signs BP (!) 147/96 (BP Location: Left Arm)   Pulse 65   Temp 98.6 F (37 C) (Oral)   Resp 17   Ht 5\' 2"  (1.575 m)   Wt 94.3 kg (208 lb)   SpO2 96%   BMI 38.04 kg/m   Physical Exam  Constitutional: She  appears well-developed and well-nourished. No distress.  HENT:  Head: Normocephalic and atraumatic.  Eyes: Conjunctivae are normal.  Neck: Neck supple.  Cardiovascular: Normal rate and regular rhythm.   No murmur heard. Pulmonary/Chest: Effort normal and breath sounds normal. No respiratory distress.  Abdominal: Soft. There is no guarding.  Musculoskeletal: She exhibits no edema.  Neurological: She is alert.  Skin: Skin is warm and dry.  Psychiatric: She has a normal mood and affect.  Nursing note and vitals reviewed.    ED Treatments / Results  Labs (all labs ordered are listed, but only abnormal results are displayed) Labs Reviewed  CBC WITH DIFFERENTIAL/PLATELET - Abnormal; Notable for the following:        Result Value   Platelets 124 (*)    All other components within normal limits  COMPREHENSIVE METABOLIC PANEL - Abnormal; Notable for the following:    Sodium 123 (*)    Potassium 2.8 (*)    Chloride 89 (*)    Glucose, Bld 157 (*)    BUN 34 (*)    Creatinine, Ser 1.61 (*)    Calcium 8.3 (*)    GFR calc non Af Amer 33 (*)    GFR calc Af Amer 38 (*)    All other components within normal limits  URINALYSIS, ROUTINE W REFLEX MICROSCOPIC - Abnormal; Notable for the following:    Protein, ur >=300 (*)    Leukocytes, UA SMALL (*)    Bacteria, UA RARE (*)    Squamous Epithelial / LPF 0-5 (*)    All other components within normal limits  GASTROINTESTINAL PANEL BY PCR, STOOL (REPLACES STOOL CULTURE)    EKG  EKG Interpretation None      Labs consistent with dehydration.  Pt has had elevated bun and creatinine.  Pt given Iv fluids.  No diarrhea while in ED.  Pt able to tolerate po fluids.  Pt advisd to recheck with her MD in 2 days.  Pt given rx for lomotil  Radiology No results found.  Procedures Procedures (including critical care time)  Medications Ordered in ED Medications  potassium chloride SA (K-DUR,KLOR-CON) CR tablet 40 mEq (40 mEq Oral Given 10/11/16 1115)  sodium chloride 0.9 % bolus 1,000 mL (0 mLs Intravenous Stopped 10/11/16 1212)     Initial Impression / Assessment and Plan / ED Course  I have reviewed the triage vital signs and the nursing notes.  Pertinent labs & imaging results that were available during my care of the patient were reviewed by me and considered in my medical decision making (see chart for details).      Final Clinical Impressions(s) / ED Diagnoses   Final diagnoses:  Dehydration  Diarrhea, unspecified type    New Prescriptions Discharge Medication List as of 10/11/2016  1:15 PM    START taking these medications   Details  diphenoxylate-atropine (LOMOTIL) 2.5-0.025 MG tablet Take 1 tablet by mouth 4 (four) times daily as needed for  diarrhea or loose stools., Starting Wed 10/11/2016, Print      An After Visit Summary was printed and given to the patient.   Elson Areas, [place] 10/11/16 1432    Mancel Bale, MD 10/12/16 1434

## 2016-10-11 NOTE — ED Notes (Signed)
Advised pt that a stool sample is needed. Bedside commode placed in pt.'s room.

## 2016-11-16 ENCOUNTER — Emergency Department (HOSPITAL_COMMUNITY)
Admission: EM | Admit: 2016-11-16 | Discharge: 2016-11-16 | Disposition: A | Discharge status: Home / Self Care | Payer: BLUE CROSS/BLUE SHIELD | Attending: Emergency Medicine | Admitting: Emergency Medicine

## 2016-11-16 ENCOUNTER — Encounter (HOSPITAL_COMMUNITY): Payer: Self-pay | Admitting: Emergency Medicine

## 2016-11-16 DIAGNOSIS — I10 Essential (primary) hypertension: Secondary | ICD-10-CM | POA: Diagnosis not present

## 2016-11-16 DIAGNOSIS — Z5321 Procedure and treatment not carried out due to patient leaving prior to being seen by health care provider: Secondary | ICD-10-CM | POA: Diagnosis not present

## 2016-11-16 NOTE — ED Triage Notes (Signed)
Pt checks her BP every morning, had high reading 178/108, took medication, had mild chest tightness, no other symptoms, no symptoms at present.

## 2016-11-16 NOTE — ED Notes (Signed)
Pt made registration aware she was going to leave. Pt ambulatory upon leaving.

## 2016-12-18 ENCOUNTER — Emergency Department (HOSPITAL_COMMUNITY): Payer: BLUE CROSS/BLUE SHIELD

## 2016-12-18 ENCOUNTER — Observation Stay (HOSPITAL_COMMUNITY)
Admission: EM | Admit: 2016-12-18 | Discharge: 2016-12-20 | Disposition: A | Discharge status: Home / Self Care | Payer: BLUE CROSS/BLUE SHIELD | Attending: Internal Medicine | Admitting: Internal Medicine

## 2016-12-18 ENCOUNTER — Encounter (HOSPITAL_COMMUNITY): Payer: Self-pay | Admitting: Emergency Medicine

## 2016-12-18 ENCOUNTER — Other Ambulatory Visit: Payer: Self-pay

## 2016-12-18 DIAGNOSIS — Z79899 Other long term (current) drug therapy: Secondary | ICD-10-CM | POA: Diagnosis not present

## 2016-12-18 DIAGNOSIS — Z8673 Personal history of transient ischemic attack (TIA), and cerebral infarction without residual deficits: Secondary | ICD-10-CM | POA: Diagnosis not present

## 2016-12-18 DIAGNOSIS — I1A0 Resistant hypertension: Secondary | ICD-10-CM

## 2016-12-18 DIAGNOSIS — R9089 Other abnormal findings on diagnostic imaging of central nervous system: Secondary | ICD-10-CM | POA: Diagnosis present

## 2016-12-18 DIAGNOSIS — R202 Paresthesia of skin: Secondary | ICD-10-CM | POA: Diagnosis not present

## 2016-12-18 DIAGNOSIS — I10 Essential (primary) hypertension: Secondary | ICD-10-CM | POA: Insufficient documentation

## 2016-12-18 DIAGNOSIS — E785 Hyperlipidemia, unspecified: Secondary | ICD-10-CM | POA: Diagnosis present

## 2016-12-18 DIAGNOSIS — R531 Weakness: Secondary | ICD-10-CM | POA: Diagnosis not present

## 2016-12-18 DIAGNOSIS — R0789 Other chest pain: Secondary | ICD-10-CM

## 2016-12-18 DIAGNOSIS — I161 Hypertensive emergency: Secondary | ICD-10-CM | POA: Diagnosis present

## 2016-12-18 DIAGNOSIS — E876 Hypokalemia: Secondary | ICD-10-CM | POA: Diagnosis present

## 2016-12-18 DIAGNOSIS — R079 Chest pain, unspecified: Secondary | ICD-10-CM | POA: Diagnosis present

## 2016-12-18 MED ORDER — NITROGLYCERIN 2 % TD OINT
1.0000 [in_us] | TOPICAL_OINTMENT | Freq: Once | TRANSDERMAL | Status: AC
  Administered 2016-12-18: 1 [in_us] via TOPICAL
  Filled 2016-12-18: qty 1

## 2016-12-18 MED ORDER — ASPIRIN 325 MG PO TABS
325.0000 mg | ORAL_TABLET | Freq: Once | ORAL | Status: AC
Start: 2016-12-18 — End: 2016-12-18
  Administered 2016-12-18: 325 mg via ORAL
  Filled 2016-12-18: qty 1

## 2016-12-18 NOTE — ED Triage Notes (Signed)
Pt states blood pressure has been high and c/o left sided chest tightness x one hour.

## 2016-12-18 NOTE — ED Provider Notes (Signed)
[facility] EMERGENCY DEPARTMENT Provider Note   CSN: [id number] Arrival date & time: 12/18/16  2238  Time seen 23:10 PM   History   Chief Complaint Chief Complaint  Patient presents with  . Chest Pain    HPI Danielle Jordan is a 66 y.o. female.  HPI patient states around 10 PM she was watching TV.  She states her left chest got tight and she had tingling and tightness in her left upper extremity and fingers.  She states she took her blood pressure and it was over 200.  She took her clonidine and it was still around 200 so she came to the ED.  Patient states she had a stroke in May and has left-sided weakness.  She has chronic left facial numbness and she uses a cane because of mild left lower extremity weakness.  She states tonight she is not having any shortness of breath, diaphoresis, nausea, vomiting, headache, blurred vision, pain or swelling in her lower extremities.  She states her whole left chest feels tight.  Nothing she does makes it feel worse, however movement of her left arm makes it feel better.  Patient states she has been having discomfort off and on for the past 2 months.  She normally takes Tums which she did tonight which minimally helped.  She is discussed with her doctor and he told her just to take oral things like Tums, she is not on a PPI or stomach acid reducer.  She states she normally gets it when she does not eat enough.  Otherwise she does not related to eating food she eats.  She states her chest tightness at its worst was an 8-9/10 and currently is a 7 out of 10 after taking the Tums.  She denies any abdominal bloating.  Patient is right-handed  PCP at [facility]  Past Medical History:  Diagnosis Date  . Hyperlipidemia   . Hypertension   . Stroke [facility])     Patient Active Problem List   Diagnosis Date Noted  . Hypertensive emergency 07/26/2016  . Stroke (HCC) 07/26/2016  . Hypokalemia 07/26/2016  . Hyperlipidemia 07/26/2016  . Chest discomfort  07/26/2016    History reviewed. No pertinent surgical history.  OB History    No data available       Home Medications    Prior to Admission medications   Medication Sig Start Date End Date Taking? Authorizing Provider  acetaminophen (TYLENOL) 500 MG tablet Take 500 mg every 6 (six) hours as needed by mouth for mild pain or moderate pain.   Yes [provider]  amLODipine (NORVASC) 10 MG tablet Take 10 mg by mouth daily. 08/21/16  Yes [provider]  calcium carbonate (TUMS - DOSED IN MG ELEMENTAL CALCIUM) 500 MG chewable tablet Chew 1 tablet daily as needed by mouth for indigestion or heartburn.   Yes [provider]  chlorthalidone (HYGROTON) 25 MG tablet Take 25 mg by mouth daily. 09/04/16  Yes [provider]  cloNIDine (CATAPRES) 0.1 MG tablet Take 1 po for BP over 200 systolic, if BP is not improved in 1 hour may repeat Patient taking differently: Take 0.1 mg daily as needed by mouth. Take 1 po for BP over 200 systolic, if BP is not improved in 1 hour may repeat 07/29/16  Yes Devoria Albe, MD  hydrALAZINE (APRESOLINE) 100 MG tablet Take 100 mg by mouth every 8 (eight) hours.  07/21/16  Yes [provider]  Omega-3 Fatty Acids (OMEGA-3 FISH OIL  PO) Take 1 capsule daily by mouth.   Yes [provider]  Probiotic Product (PROBIOTIC FORMULA PO) Take 1 capsule daily by mouth.   Yes [provider]  spironolactone (ALDACTONE) 25 MG tablet Take 25 mg by mouth daily. 09/04/16  Yes [provider]  diphenoxylate-atropine (LOMOTIL) 2.5-0.025 MG tablet Take 1 tablet by mouth 4 (four) times daily as needed for diarrhea or loose stools. Patient not taking: Reported on 12/18/2016 10/11/16   Elson AreasSofia, Leslie K, PA-C  hydrochlorothiazide (MICROZIDE) 12.5 MG capsule Take 2 capsules (25 mg total) by mouth daily. Patient not taking: Reported on 12/18/2016 07/28/16   Philip AspenHernandez Acosta, Limmie PatriciaEstela Y, MD  labetalol (NORMODYNE) 200 MG tablet Take 2  tablets (400 mg total) by mouth 2 (two) times daily. Patient not taking: Reported on 12/18/2016 07/28/16   Philip AspenHernandez Acosta, Limmie PatriciaEstela Y, MD    Family History Family History  Problem Relation Age of Onset  . Hypertension Mother   . Hypertension Father   . Arthritis Father   . Stroke Paternal Aunt     Social History Social History   Tobacco Use  . Smoking status: Never Smoker  . Smokeless tobacco: Never Used  Substance Use Topics  . Alcohol use: No  . Drug use: No  lives with daughter Uses a quad cane   Allergies   Lisinopril   Review of Systems Review of Systems  All other systems reviewed and are negative.    Physical Exam Updated Vital Signs BP (!) 160/89   Pulse 94   Temp 98.4 F (36.9 C)   Resp 20   Ht 5\' 2"  (1.575 m)   Wt 85.7 kg (189 lb)   SpO2 94%   BMI 34.57 kg/m   Vital signs normal except hypertension   Physical Exam  Constitutional: She is oriented to person, place, and time. She appears well-developed and well-nourished.  Non-toxic appearance. She does not appear ill. No distress.  HENT:  Head: Normocephalic and atraumatic.  Right Ear: External ear normal.  Left Ear: External ear normal.  Nose: Nose normal. No mucosal edema or rhinorrhea.  Mouth/Throat: Oropharynx is clear and moist and mucous membranes are normal. No dental abscesses or uvula swelling.  Eyes: Conjunctivae and EOM are normal. Pupils are equal, round, and reactive to light.  Neck: Normal range of motion and full passive range of motion without pain. Neck supple.  Cardiovascular: Normal rate, regular rhythm, normal heart sounds and intact distal pulses. Exam reveals no gallop and no friction rub.  No murmur heard. Pulmonary/Chest: Effort normal and breath sounds normal. No respiratory distress. She has no wheezes. She has no rhonchi. She has no rales. She exhibits no tenderness, no bony tenderness and no crepitus.  Area of chest pain noted.    Abdominal: Soft. Normal appearance  and bowel sounds are normal. She exhibits no distension. There is no tenderness. There is no rebound and no guarding.  Musculoskeletal: Normal range of motion. She exhibits no edema or tenderness.  Moves all extremities well.   Neurological: She is alert and oriented to person, place, and time. She has normal strength. No cranial nerve deficit.  No pronator drift, mild left grip weakness, patient has some difficulty holding her left leg up against gravity which she states is normal.  She also states she has some chronic numbness in the left cheek area.  Skin: Skin is warm, dry and intact. No rash noted. No erythema. No pallor.  Psychiatric: She has a normal mood and affect.  Her speech is normal and behavior is normal. Her mood appears not anxious.  Nursing note and vitals reviewed.    ED Treatments / Results  Labs (all labs ordered are listed, but only abnormal results are displayed) Results for orders placed or performed during the hospital encounter of 12/18/16  CBC  Result Value Ref Range   WBC 12.2 (H) 4.0 - 10.5 K/uL   RBC 4.53 3.87 - 5.11 MIL/uL   Hemoglobin 12.3 12.0 - 15.0 g/dL   HCT 16.1 09.6 - 04.5 %   MCV 84.5 78.0 - 100.0 fL   MCH 27.2 26.0 - 34.0 pg   MCHC 32.1 30.0 - 36.0 g/dL   RDW 40.9 81.1 - 91.4 %   Platelets 248 150 - 400 K/uL  Troponin I  Result Value Ref Range   Troponin I <0.03 <0.03 ng/mL  Ethanol  Result Value Ref Range   Alcohol, Ethyl (B) <10 <10 mg/dL  Protime-INR  Result Value Ref Range   Prothrombin Time 13.4 11.4 - 15.2 seconds   INR 1.03   APTT  Result Value Ref Range   aPTT 35 24 - 36 seconds  Comprehensive metabolic panel  Result Value Ref Range   Sodium 133 (L) 135 - 145 mmol/L   Potassium 3.1 (L) 3.5 - 5.1 mmol/L   Chloride 100 (L) 101 - 111 mmol/L   CO2 22 22 - 32 mmol/L   Glucose, Bld 138 (H) 65 - 99 mg/dL   BUN 24 (H) 6 - 20 mg/dL   Creatinine, Ser 7.82 (H) 0.44 - 1.00 mg/dL   Calcium 9.0 8.9 - 95.6 mg/dL   Total Protein 7.8 6.5 -  8.1 g/dL   Albumin 4.0 3.5 - 5.0 g/dL   AST 16 15 - 41 U/L   ALT 14 14 - 54 U/L   Alkaline Phosphatase 56 38 - 126 U/L   Total Bilirubin 0.5 0.3 - 1.2 mg/dL   GFR calc non Af Amer 40 (L) >60 mL/min   GFR calc Af Amer 46 (L) >60 mL/min   Anion gap 11 5 - 15  Urine rapid drug screen (hosp performed)  Result Value Ref Range   Opiates NONE DETECTED NONE DETECTED   Cocaine NONE DETECTED NONE DETECTED   Benzodiazepines NONE DETECTED NONE DETECTED   Amphetamines NONE DETECTED NONE DETECTED   Tetrahydrocannabinol NONE DETECTED NONE DETECTED   Barbiturates NONE DETECTED NONE DETECTED  Urinalysis, Routine w reflex microscopic  Result Value Ref Range   Color, Urine STRAW (A) YELLOW   APPearance CLEAR CLEAR   Specific Gravity, Urine 1.003 (L) 1.005 - 1.030   pH 6.0 5.0 - 8.0   Glucose, UA NEGATIVE NEGATIVE mg/dL   Hgb urine dipstick NEGATIVE NEGATIVE   Bilirubin Urine NEGATIVE NEGATIVE   Ketones, ur NEGATIVE NEGATIVE mg/dL   Protein, ur NEGATIVE NEGATIVE mg/dL   Nitrite NEGATIVE NEGATIVE   Leukocytes, UA NEGATIVE NEGATIVE   Laboratory interpretation all normal except leukocytosis and renal insufficiency    EKG  EKG Interpretation  Date/Time:  Monday December 18 2016 22:56:57 EST Ventricular Rate:  102 PR Interval:  138 QRS Duration: 94 QT Interval:  362 QTC Calculation: 471 R Axis:   -11 Text Interpretation:  Sinus tachycardia Septal infarct , age undetermined ST & T wave abnormality, consider lateral ischemia Since last tracing rate faster 16 Nov 2016 Confirmed by Devoria Albe ([id number]) on 12/18/2016 11:01:03 PM       Radiology Dg Chest 2 View  Result Date: 12/19/2016 CLINICAL DATA:  Left-sided chest tightness and shortness of breath since 12/18/2016. History of hypertension. EXAM: CHEST  2 VIEW COMPARISON:  07/26/2016 FINDINGS: Normal heart size and pulmonary vascularity. No focal airspace disease or consolidation in the lungs. No blunting of costophrenic angles. No  pneumothorax. Mediastinal contours appear intact. Tortuous aorta. Degenerative changes in the spine. IMPRESSION: No active cardiopulmonary disease. Electronically Signed   By: Burman Nieves M.D.   On: 12/19/2016 00:12   Ct Head Wo Contrast  Result Date: 12/19/2016 CLINICAL DATA:  High blood pressure. Left-sided chest tightness. Increased tingling in the left upper extremity and increased weakness in the left lower extremity. EXAM: CT HEAD WITHOUT CONTRAST TECHNIQUE: Contiguous axial images were obtained from the base of the skull through the vertex without intravenous contrast. COMPARISON:  07/26/2016 FINDINGS: Brain: Ventricles and sulci appear symmetrical without significant atrophy. There is patchy heterogeneous low-attenuation change throughout the deep white matter bilaterally. This could represent small vessel ischemic change although other white matter disease could also have this appearance. Consider vasculitis or demyelinating process such as MS. MRI may be useful in further evaluation if clinically indicated. No mass effect or midline shift. No abnormal extra-axial fluid collections. Gray-white matter junctions are distinct. Basal cisterns are not effaced. No acute intracranial hemorrhage. Vascular: No hyperdense vessel or unexpected calcification. Skull: Normal. Negative for fracture or focal lesion. Sinuses/Orbits: No acute finding. Other: None. IMPRESSION: Patchy low-attenuation changes throughout the deep white matter without associated atrophy. Changes may represent small vessel ischemia versus other demyelinating process. Consider MS or vasculitis. No acute intracranial abnormalities. Electronically Signed   By: Burman Nieves M.D.   On: 12/19/2016 00:19    Procedures Procedures (including critical care time)  Medications Ordered in ED Medications  aspirin tablet 325 mg (325 mg Oral Given 12/18/16 2340)  nitroGLYCERIN (NITROGLYN) 2 % ointment 1 inch (1 inch Topical Given 12/18/16  2341)  potassium chloride SA (K-DUR,KLOR-CON) CR tablet 40 mEq (40 mEq Oral Given 12/19/16 0159)     Initial Impression / Assessment and Plan / ED Course  I have reviewed the triage vital signs and the nursing notes.  Pertinent labs & imaging results that were available during my care of the patient were reviewed by me and considered in my medical decision making (see chart for details).    Patient presents with left chest pain and also possible some worsening of her baseline left-sided weakness after her stroke in May.  View of care everywhere shows patient had a intraparenchymal hemorrhage in the right thalamus in May.  She was admitted to [facility] with hypertensive emergency.  1:45 AM I have reviewed patient's laboratory and radiology test results.  She was given oral potassium for her mild hypokalemia.  She has a baseline renal insufficiency.  After looking at her CT scan and her prior CT scan done at [facility] tele-neurology consult was ordered.  Patient states her chest pain is almost gone.  We discussed her test results and she was aware of the neurology consult being done.  02:30 AM Dr Corrie Mckusick, teleneurology, recommends admission, can get MRI this morning (not needed emergently tonight).  02:54 AM Dr Robb Matar, hospitalist will admit  Final Clinical Impressions(s) / ED Diagnoses   Final diagnoses:  Chest tightness  Left-sided weakness  Essential hypertension    Plan admission  Devoria Albe, MD, Concha Pyo, MD 12/19/16 [phone]

## 2016-12-19 ENCOUNTER — Observation Stay (HOSPITAL_BASED_OUTPATIENT_CLINIC_OR_DEPARTMENT_OTHER): Payer: BLUE CROSS/BLUE SHIELD

## 2016-12-19 ENCOUNTER — Encounter (HOSPITAL_COMMUNITY): Payer: Self-pay | Admitting: Internal Medicine

## 2016-12-19 DIAGNOSIS — R079 Chest pain, unspecified: Secondary | ICD-10-CM | POA: Diagnosis present

## 2016-12-19 DIAGNOSIS — R9089 Other abnormal findings on diagnostic imaging of central nervous system: Secondary | ICD-10-CM | POA: Diagnosis present

## 2016-12-19 LAB — COMPREHENSIVE METABOLIC PANEL
ALBUMIN: 4 g/dL (ref 3.5–5.0)
ALK PHOS: 56 U/L (ref 38–126)
ALT: 14 U/L (ref 14–54)
ANION GAP: 11 (ref 5–15)
AST: 16 U/L (ref 15–41)
BILIRUBIN TOTAL: 0.5 mg/dL (ref 0.3–1.2)
BUN: 24 mg/dL — ABNORMAL HIGH (ref 6–20)
CALCIUM: 9 mg/dL (ref 8.9–10.3)
CO2: 22 mmol/L (ref 22–32)
Chloride: 100 mmol/L — ABNORMAL LOW (ref 101–111)
Creatinine, Ser: 1.37 mg/dL — ABNORMAL HIGH (ref 0.44–1.00)
GFR calc non Af Amer: 40 mL/min — ABNORMAL LOW (ref >60)
GFR, EST AFRICAN AMERICAN: 46 mL/min — AB (ref >60)
GLUCOSE: 138 mg/dL — AB (ref 65–99)
Potassium: 3.1 mmol/L — ABNORMAL LOW (ref 3.5–5.1)
Sodium: 133 mmol/L — ABNORMAL LOW (ref 135–145)
TOTAL PROTEIN: 7.8 g/dL (ref 6.5–8.1)

## 2016-12-19 LAB — URINALYSIS, ROUTINE W REFLEX MICROSCOPIC
BILIRUBIN URINE: NEGATIVE
Glucose, UA: NEGATIVE mg/dL
HGB URINE DIPSTICK: NEGATIVE
Ketones, ur: NEGATIVE mg/dL
Leukocytes, UA: NEGATIVE
Nitrite: NEGATIVE
Protein, ur: NEGATIVE mg/dL
SPECIFIC GRAVITY, URINE: 1.003 — AB (ref 1.005–1.030)
pH: 6 (ref 5.0–8.0)

## 2016-12-19 LAB — SEDIMENTATION RATE: Sed Rate: 37 mm/hr — ABNORMAL HIGH (ref 0–22)

## 2016-12-19 LAB — ECHOCARDIOGRAM COMPLETE
HEIGHTINCHES: 62 in
WEIGHTICAEL: 3023.65 [oz_av]

## 2016-12-19 LAB — TROPONIN I
Troponin I: <0.03 ng/mL (ref <0.03)
Troponin I: <0.03 ng/mL (ref <0.03)

## 2016-12-19 LAB — CBC
HCT: 38.3 % (ref 36.0–46.0)
Hemoglobin: 12.3 g/dL (ref 12.0–15.0)
MCH: 27.2 pg (ref 26.0–34.0)
MCHC: 32.1 g/dL (ref 30.0–36.0)
MCV: 84.5 fL (ref 78.0–100.0)
PLATELETS: 248 10*3/uL (ref 150–400)
RBC: 4.53 MIL/uL (ref 3.87–5.11)
RDW: 15.1 % (ref 11.5–15.5)
WBC: 12.2 10*3/uL — AB (ref 4.0–10.5)

## 2016-12-19 LAB — RAPID URINE DRUG SCREEN, HOSP PERFORMED
Amphetamines: NOT DETECTED
BENZODIAZEPINES: NOT DETECTED
Barbiturates: NOT DETECTED
Cocaine: NOT DETECTED
Opiates: NOT DETECTED
Tetrahydrocannabinol: NOT DETECTED

## 2016-12-19 LAB — PROTIME-INR
INR: 1.03
PROTHROMBIN TIME: 13.4 s (ref 11.4–15.2)

## 2016-12-19 LAB — MAGNESIUM: Magnesium: 1.8 mg/dL (ref 1.7–2.4)

## 2016-12-19 LAB — ETHANOL

## 2016-12-19 LAB — APTT: aPTT: 35 seconds (ref 24–36)

## 2016-12-19 LAB — PHOSPHORUS: Phosphorus: 3.1 mg/dL (ref 2.5–4.6)

## 2016-12-19 MED ORDER — ONDANSETRON HCL 4 MG/2ML IJ SOLN: 4.0000 mg | Freq: Four times a day (QID) | INTRAMUSCULAR | Status: DC | PRN

## 2016-12-19 MED ORDER — MAGNESIUM SULFATE 2 GM/50ML IV SOLN
2.0000 g | Freq: Once | INTRAVENOUS | Status: AC
  Administered 2016-12-19: 2 g via INTRAVENOUS
  Filled 2016-12-19: qty 50

## 2016-12-19 MED ORDER — ALPRAZOLAM 0.25 MG PO TABS: 0.2500 mg | ORAL_TABLET | Freq: Three times a day (TID) | ORAL | Status: DC | PRN

## 2016-12-19 MED ORDER — ACETAMINOPHEN 500 MG PO TABS: 500.0000 mg | ORAL_TABLET | Freq: Four times a day (QID) | ORAL | Status: DC | PRN

## 2016-12-19 MED ORDER — NITROGLYCERIN 2 % TD OINT
1.0000 [in_us] | TOPICAL_OINTMENT | Freq: Once | TRANSDERMAL | Status: AC
  Administered 2016-12-19: 1 [in_us] via TOPICAL
  Filled 2016-12-19: qty 1

## 2016-12-19 MED ORDER — AMLODIPINE BESYLATE 5 MG PO TABS
10.0000 mg | ORAL_TABLET | Freq: Every day | ORAL | Status: DC
  Administered 2016-12-19 – 2016-12-20 (×2): 10 mg via ORAL
  Filled 2016-12-19 (×2): qty 2

## 2016-12-19 MED ORDER — METOPROLOL TARTRATE 25 MG PO TABS
25.0000 mg | ORAL_TABLET | Freq: Two times a day (BID) | ORAL | Status: DC
  Administered 2016-12-19 (×2): 25 mg via ORAL
  Filled 2016-12-19 (×3): qty 1

## 2016-12-19 MED ORDER — SPIRONOLACTONE 25 MG PO TABS
25.0000 mg | ORAL_TABLET | Freq: Every day | ORAL | Status: DC
  Administered 2016-12-19 – 2016-12-20 (×2): 25 mg via ORAL
  Filled 2016-12-19 (×2): qty 1

## 2016-12-19 MED ORDER — POTASSIUM CHLORIDE CRYS ER 20 MEQ PO TBCR
40.0000 meq | EXTENDED_RELEASE_TABLET | Freq: Once | ORAL | Status: AC
  Administered 2016-12-19: 40 meq via ORAL
  Filled 2016-12-19: qty 2

## 2016-12-19 MED ORDER — CHLORTHALIDONE 25 MG PO TABS
25.0000 mg | ORAL_TABLET | Freq: Every day | ORAL | Status: DC
  Administered 2016-12-19 – 2016-12-20 (×2): 25 mg via ORAL
  Filled 2016-12-19 (×4): qty 1

## 2016-12-19 MED ORDER — HYDRALAZINE HCL 25 MG PO TABS
100.0000 mg | ORAL_TABLET | Freq: Three times a day (TID) | ORAL | Status: DC
  Administered 2016-12-19 – 2016-12-20 (×3): 100 mg via ORAL
  Filled 2016-12-19 (×3): qty 4

## 2016-12-19 MED ORDER — CLONIDINE HCL 0.1 MG PO TABS: 0.1000 mg | ORAL_TABLET | Freq: Two times a day (BID) | ORAL | Status: DC | PRN

## 2016-12-19 NOTE — H&P (Signed)
History and Physical    Danielle Jordan ZHY:[id number] DOB: [birth year] DOA: 12/18/2016  PCP: Patient, No Pcp Per   Patient coming from: Home.  I have personally briefly reviewed patient's old medical records in [facility]  Chief Complaint: Chest pain and high blood pressure.  HPI: Danielle Jordan is a 66 y.o. female with medical history significant of hyperlipidemia, hypertension, hemorrhagic thalamic stroke in May/2018 who is coming to the emergency department with complaints of precordial chest pain radiated to her left shoulder and left upper back, described as a tightness, without exacerbating factors, dyspnea, diaphoresis, dizziness, palpitations, nausea, emesis, reports of PND, orthopnea or recent lower extremity edema.  She mentions that she took her blood pressure and was over 200 so she take a 0.1 mg clonidine and came to the emergency department.  In the ER, her pain was relieved by aspirin and Nitropaste.  She denies headache, blurred vision, sore throat, productive cough, abdominal pain, diarrhea, melena, hematochezia, but complains of constipation.  She denies GU symptoms.  No polyuria, no polydipsia or blurred vision.  ED Course: Initial vital signs in the emergency department temperature 98.33F, pulse 111, respirations 17, blood pressure 170/117 mmHg and O2 sat 89% on room air (repeat O2 sat measurements on room air have been normal).  Dr. Lynelle Doctor discuss the case with tele-neurology.  Workup shows normal urinalysis.  Negative urine toxicology.  EKG with sinus tachycardia, nonspecific ST segment and T wave abnormalities, but similar to previous tracing.  Her troponin level was normal.  Her sodium was 133, potassium 3.1, chloride 100, bicarbonate 22 millimolar/L.  Her BUN was 24, creatinine 1.37 and glucose 138 mg/dL.  LFTs were normal.  Alcohol level is normal.   Imaging: chest radiograph did not show any acute cardiopulmonary pathology.  CT scan of the brain showed patchy low  attenuation changes throughout the deep white matter with associated atrophy.  Small vessel ischemia versus other demyelinating process should be considered.  Please see images and full radiology report for further detail.  Review of Systems: As per HPI otherwise 10 point review of systems negative.    Past Medical History:  Diagnosis Date  . Hyperlipidemia   . Hypertension   . Stroke [facility])     History reviewed. No pertinent surgical history.   reports that  has never smoked. she has never used smokeless tobacco. She reports that she does not drink alcohol or use drugs.  Allergies  Allergen Reactions  . Lisinopril Cough    Family History  Problem Relation Age of Onset  . Hypertension Mother   . Hypertension Father   . Arthritis Father   . Stroke Paternal Aunt     Prior to Admission medications   Medication Sig Start Date End Date Taking? Authorizing Provider  acetaminophen (TYLENOL) 500 MG tablet Take 500 mg every 6 (six) hours as needed by mouth for mild pain or moderate pain.   Yes [provider]  amLODipine (NORVASC) 10 MG tablet Take 10 mg by mouth daily. 08/21/16  Yes [provider]  calcium carbonate (TUMS - DOSED IN MG ELEMENTAL CALCIUM) 500 MG chewable tablet Chew 1 tablet daily as needed by mouth for indigestion or heartburn.   Yes [provider]  chlorthalidone (HYGROTON) 25 MG tablet Take 25 mg by mouth daily. 09/04/16  Yes [provider]  cloNIDine (CATAPRES) 0.1 MG tablet Take 1 po for BP over 200 systolic, if BP is not improved in 1 hour may repeat Patient taking differently: Take  0.1 mg daily as needed by mouth. Take 1 po for BP over 200 systolic, if BP is not improved in 1 hour may repeat 07/29/16  Yes Devoria AlbeKnapp, Iva, MD  hydrALAZINE (APRESOLINE) 100 MG tablet Take 100 mg by mouth every 8 (eight) hours.  07/21/16  Yes [provider]  Omega-3 Fatty Acids (OMEGA-3 FISH OIL PO) Take 1 capsule daily by mouth.   Yes [provider]  Probiotic Product (PROBIOTIC FORMULA PO) Take 1 capsule daily by mouth.   Yes [provider]  spironolactone (ALDACTONE) 25 MG tablet Take 25 mg by mouth daily. 09/04/16  Yes [provider]  diphenoxylate-atropine (LOMOTIL) 2.5-0.025 MG tablet Take 1 tablet by mouth 4 (four) times daily as needed for diarrhea or loose stools. Patient not taking: Reported on 12/18/2016 10/11/16   Elson AreasSofia, Leslie K, PA-C  hydrochlorothiazide (MICROZIDE) 12.5 MG capsule Take 2 capsules (25 mg total) by mouth daily. Patient not taking: Reported on 12/18/2016 07/28/16   Philip AspenHernandez Acosta, Limmie PatriciaEstela Y, MD  labetalol (NORMODYNE) 200 MG tablet Take 2 tablets (400 mg total) by mouth 2 (two) times daily. Patient not taking: Reported on 12/18/2016 07/28/16   Philip AspenHernandez Acosta, Limmie PatriciaEstela Y, MD    Physical Exam: Vitals:   12/19/16 0215 12/19/16 0230 12/19/16 0330 12/19/16 0337  BP: (!) 175/102 (!) 176/102 (!) 154/108 (!) 156/92  Pulse: 85 75 73 69  Resp: 20 19 15 20   Temp:      TempSrc:      SpO2: 99% 98% 97% 96%  Weight:      Height:        Constitutional: NAD, calm, comfortable Eyes: PERRL, lids and conjunctivae normal ENMT: Mucous membranes are moist. Posterior pharynx clear of any exudate or lesions.Normal dentition.  Neck: normal, supple, no masses, no thyromegaly Respiratory: clear to auscultation bilaterally, no wheezing, no crackles. Normal respiratory effort. No accessory muscle use.  Cardiovascular: Regular rate and rhythm, no murmurs / rubs / gallops. No extremity edema. 2+ pedal pulses. No carotid bruits.  Abdomen: no tenderness, no masses palpated. No hepatosplenomegaly. Bowel sounds positive.  Musculoskeletal: no clubbing / cyanosis. No joint deformity upper and lower extremities. Good ROM, no contractures. Normal muscle tone.  Skin: no rashes, lesions, ulcers on limited skin exam. Neurologic: CN 2-12 grossly intact. Sensation is decreased on left facial area, DTR normal. Strength  5/5 in all 4.  Psychiatric: Normal judgment and insight. Alert and oriented x 4. Normal mood.    Labs on Admission: I have personally reviewed following labs and imaging studies  CBC: Recent Labs  Lab 12/18/16 2347  WBC 12.2*  HGB 12.3  HCT 38.3  MCV 84.5  PLT 248   Basic Metabolic Panel: Recent Labs  Lab 12/18/16 2347  NA 133*  K 3.1*  CL 100*  CO2 22  GLUCOSE 138*  BUN 24*  CREATININE 1.37*  CALCIUM 9.0  MG 1.8  PHOS 3.1   GFR: Estimated Creatinine Clearance: 42.1 mL/min (A) (by C-G formula based on SCr of 1.37 mg/dL (H)). Liver Function Tests: Recent Labs  Lab 12/18/16 2347  AST 16  ALT 14  ALKPHOS 56  BILITOT 0.5  PROT 7.8  ALBUMIN 4.0   No results for input(s): LIPASE, AMYLASE in the last 168 hours. No results for input(s): AMMONIA in the last 168 hours. Coagulation Profile: Recent Labs  Lab 12/18/16 2347  INR 1.03   Cardiac Enzymes: Recent Labs  Lab 12/18/16 2347  TROPONINI <0.03   BNP (last 3 results) No  results for input(s): PROBNP in the last 8760 hours. HbA1C: No results for input(s): HGBA1C in the last 72 hours. CBG: No results for input(s): GLUCAP in the last 168 hours. Lipid Profile: No results for input(s): CHOL, HDL, LDLCALC, TRIG, CHOLHDL, LDLDIRECT in the last 72 hours. Thyroid Function Tests: No results for input(s): TSH, T4TOTAL, FREET4, T3FREE, THYROIDAB in the last 72 hours. Anemia Panel: No results for input(s): VITAMINB12, FOLATE, FERRITIN, TIBC, IRON, RETICCTPCT in the last 72 hours. Urine analysis:    Component Value Date/Time   COLORURINE STRAW (A) 12/19/2016 0212   APPEARANCEUR CLEAR 12/19/2016 0212   LABSPEC 1.003 (L) 12/19/2016 0212   PHURINE 6.0 12/19/2016 0212   GLUCOSEU NEGATIVE 12/19/2016 0212   HGBUR NEGATIVE 12/19/2016 0212   BILIRUBINUR NEGATIVE 12/19/2016 0212   KETONESUR NEGATIVE 12/19/2016 0212   PROTEINUR NEGATIVE 12/19/2016 0212   NITRITE NEGATIVE 12/19/2016 0212   LEUKOCYTESUR NEGATIVE  12/19/2016 0212    Radiological Exams on Admission: Dg Chest 2 View  Result Date: 12/19/2016 CLINICAL DATA:  Left-sided chest tightness and shortness of breath since 12/18/2016. History of hypertension. EXAM: CHEST  2 VIEW COMPARISON:  07/26/2016 FINDINGS: Normal heart size and pulmonary vascularity. No focal airspace disease or consolidation in the lungs. No blunting of costophrenic angles. No pneumothorax. Mediastinal contours appear intact. Tortuous aorta. Degenerative changes in the spine. IMPRESSION: No active cardiopulmonary disease. Electronically Signed   By: Burman NievesWilliam  Stevens M.D.   On: 12/19/2016 00:12   Ct Head Wo Contrast  Result Date: 12/19/2016 CLINICAL DATA:  High blood pressure. Left-sided chest tightness. Increased tingling in the left upper extremity and increased weakness in the left lower extremity. EXAM: CT HEAD WITHOUT CONTRAST TECHNIQUE: Contiguous axial images were obtained from the base of the skull through the vertex without intravenous contrast. COMPARISON:  07/26/2016 FINDINGS: Brain: Ventricles and sulci appear symmetrical without significant atrophy. There is patchy heterogeneous low-attenuation change throughout the deep white matter bilaterally. This could represent small vessel ischemic change although other white matter disease could also have this appearance. Consider vasculitis or demyelinating process such as MS. MRI may be useful in further evaluation if clinically indicated. No mass effect or midline shift. No abnormal extra-axial fluid collections. Gray-white matter junctions are distinct. Basal cisterns are not effaced. No acute intracranial hemorrhage. Vascular: No hyperdense vessel or unexpected calcification. Skull: Normal. Negative for fracture or focal lesion. Sinuses/Orbits: No acute finding. Other: None. IMPRESSION: Patchy low-attenuation changes throughout the deep white matter without associated atrophy. Changes may represent small vessel ischemia versus  other demyelinating process. Consider MS or vasculitis. No acute intracranial abnormalities. Electronically Signed   By: Burman NievesWilliam  Stevens M.D.   On: 12/19/2016 00:19    EKG: Independently reviewed.  Vent. rate 102 BPM PR interval 138 ms QRS duration 94 ms QT/QTc 362/471 ms P-R-T axes 19 -11 137 Sinus tachycardia Septal infarct , age undetermined ST & T wave abnormality, consider lateral ischemia  Assessment/Plan Principal Problem:   Chest pain Observation/telemetry. Trend troponin levels. Continue aspirin. Continue Nitropaste. Start metoprolol 25 mg p.o. twice daily. Check echocardiogram in a.m. Cardiology consult later today.  Active Problems:   Abnormal CT of brain Neurology has suggested MRI of brain. Will follow-up results once done.    Hypertensive emergency Resolve after the patient took a clonidine home. She was advised to maybe take her clonidine when BP reaches 180, instead of 200 mmHg. Continue amlodipine 10 mg p.o. daily. Continue chlorthalidone 25 mg p.o. daily. Continue hydralazine 100 mg p.o. every 8 hours.  Continue Spironolactone 25 mg p.o. Daily. Metoprolol 25 mg p.o. twice daily will be started. Continue clonidine as needed. Will probably need further adjustment of antihypertensive therapy.    Hypokalemia Replaced. Magnesium was supplemented. Add daily potassium supplementation.    Hyperlipidemia Continue omega-3 fish oil t as an outpatient. Continue lifestyle modifications.    DVT prophylaxis: SCDs. Code Status: Full code Family Communication: Her daughters was present in the ED. Disposition Plan: Observation for troponin level trending, cardiology consult, echocardiogram and MRI of brain. Consults called: Routine cardiology consult. Admission status: Observation/telemetry.   Bobette Mo MD [facility] Pager [phone].  If 7PM-7AM, please contact night-coverage [email or web address] Password [email or web address]  12/19/2016, 4:26 AM

## 2016-12-19 NOTE — Plan of Care (Signed)
progressing 

## 2016-12-19 NOTE — Progress Notes (Signed)
*  PRELIMINARY RESULTS* Echocardiogram 2D Echocardiogram has been performed.  Danielle Jordan, Danielle Jordan 12/19/2016, 9:36 AM

## 2016-12-19 NOTE — ED Notes (Signed)
Patient transported to X-ray 

## 2016-12-19 NOTE — Progress Notes (Signed)
Patient seen and examined, database reviewed.  No family members at bedside.  Patient admitted earlier this morning with complaints of chest pain and high blood pressure.  Also an abnormal CT scan of the brain (that in my opinion likely represents chronic hypertensive changes).  Being ruled out for ACS, blood pressure significantly improved with initiation of medications.  Echo pending.  Most likely discharge home tomorrow if blood pressure remains stable, echo without significant dysfunction and troponins negative.  Peggye PittEstela Hernandez, MD [facility] Pager: [phone]

## 2016-12-19 NOTE — ED Notes (Signed)
TeleNeuro cart in room for evaluation

## 2016-12-19 NOTE — ED Notes (Signed)
Pt requesting to go to bathroom- ambulatory to bathroom with use of cane, standby assist by this nurse- pt demonstrates steady gait. Pt aware of need for urine sample.

## 2016-12-20 DIAGNOSIS — E782 Mixed hyperlipidemia: Secondary | ICD-10-CM | POA: Diagnosis not present

## 2016-12-20 DIAGNOSIS — E876 Hypokalemia: Secondary | ICD-10-CM | POA: Diagnosis not present

## 2016-12-20 DIAGNOSIS — R079 Chest pain, unspecified: Secondary | ICD-10-CM | POA: Diagnosis not present

## 2016-12-20 DIAGNOSIS — R072 Precordial pain: Secondary | ICD-10-CM | POA: Diagnosis not present

## 2016-12-20 DIAGNOSIS — I10 Essential (primary) hypertension: Secondary | ICD-10-CM | POA: Diagnosis not present

## 2016-12-20 LAB — BASIC METABOLIC PANEL
Anion gap: 9 (ref 5–15)
BUN: 23 mg/dL — AB (ref 6–20)
CALCIUM: 9.2 mg/dL (ref 8.9–10.3)
CHLORIDE: 100 mmol/L — AB (ref 101–111)
CO2: 26 mmol/L (ref 22–32)
CREATININE: 1.33 mg/dL — AB (ref 0.44–1.00)
GFR calc non Af Amer: 41 mL/min — ABNORMAL LOW (ref >60)
GFR, EST AFRICAN AMERICAN: 48 mL/min — AB (ref >60)
Glucose, Bld: 121 mg/dL — ABNORMAL HIGH (ref 65–99)
Potassium: 3.6 mmol/L (ref 3.5–5.1)
SODIUM: 135 mmol/L (ref 135–145)

## 2016-12-20 LAB — CBC
HCT: 38.8 % (ref 36.0–46.0)
Hemoglobin: 12.2 g/dL (ref 12.0–15.0)
MCH: 26.8 pg (ref 26.0–34.0)
MCHC: 31.4 g/dL (ref 30.0–36.0)
MCV: 85.3 fL (ref 78.0–100.0)
PLATELETS: 247 10*3/uL (ref 150–400)
RBC: 4.55 MIL/uL (ref 3.87–5.11)
RDW: 15.1 % (ref 11.5–15.5)
WBC: 8.3 10*3/uL (ref 4.0–10.5)

## 2016-12-20 MED ORDER — METOPROLOL SUCCINATE ER 50 MG PO TB24
50.0000 mg | ORAL_TABLET | Freq: Every day | ORAL | Status: DC
  Administered 2016-12-20: 50 mg via ORAL
  Filled 2016-12-20: qty 1

## 2016-12-20 MED ORDER — METOPROLOL SUCCINATE ER 50 MG PO TB24: 50.0000 mg | ORAL_TABLET | Freq: Every day | ORAL | 1 refills | Status: DC

## 2016-12-20 NOTE — Progress Notes (Signed)
  December 20, 2016  Patient: Danielle Jordan  Date of Birth: October 07, 1950  Date of Visit: 12/18/2016    To Whom It May Concern:  [name] was admitted and treated at [facility] from 12/18/16 to 12/20/16 [name]  may return to work on 12/21/16.  Sincerely,   Dagoberto LigasJessica Maysen Sudol, RN

## 2016-12-20 NOTE — Discharge Summary (Signed)
Physician Discharge Summary  Danielle Jordan WUJ:[id number] DOB: 1951/01/31 DOA: 12/18/2016  PCP: Patient, No Pcp Per  Admit date: 12/18/2016 Discharge date: 12/20/2016  Admitted From: home Disposition:  Home   Recommendations for Outpatient Follow-up:  1. Follow up with PCP in 1-2 weeks 2. Please obtain BMP/CBC in one week   Discharge Condition: Stable CODE STATUS: FULL Diet recommendation: Heart Healthy    Brief/Interim Summary: 66 year old female with a history of hypertension, hyperlipidemia, and right thalamic hemorrhagic stroke in May 2018 presented with left-sided chest discomfort that started on the evening of December 19, 2016 while she was in bed.  She stated that the chest discomfort radiated to her left shoulder.  She denied any dizziness, shortness of breath, nausea, vomiting.  She denied any recent long car rides or trips.  The patient remained afebrile hemodynamically stable throughout the hospitalization with oxygen saturation 95-99% on room air.  Troponins were negative x3 during the hospitalization.  EKG shows sinus rhythm with nonspecific ST-T wave changes.  Echocardiogram showed EF 60-65%, grade 1 DD with no wall motion abnormalities.  Her CKD with a baseline.  It is felt that her atypical chest discomfort was likely related to uncontrolled hypertension.  There is high suspicion of poor compliance with her medications.  The patient was unable to identify any of her antihypertensive medications even with help.  In addition, review of the medical record shows that in her last visit with her primary care provider, Dr. Memory Dance, that the patient misses approximately 10 doses of hydralazine per week, and was only taking labetalol once daily rather than twice a day, and she was not taking Spironolactone at all.  Discharge Diagnoses:  Atypical chest pain -Troponins negative x3 -Echocardiogram EF 60-65%, grade 1 DD, no WMA -Likely due to uncontrolled hypertension related to  poor compliance with her antihypertensive medications  Hypertensive urgency -Likely due to poor compliance -Patient has poor health literacy -She has not able to identify any of her antihypertensive medications even with clues or help -Review of the medical record shows that the patient misses numerous doses of her anti-HTN meds--see brief summary above -Compliance discussed -In addition, the patient likely has rebound hypertension secondary to only intermittently taking clonidine which may account for her hypertensive urgency -Blood pressure well controlled during the hospitalization further suggesting poor compliance  -Resume home doses of amlodipine, chlorthalidone, hydralazine, spironolactone -Rx metoprolol succinate  Hypokalemia -Likely due to chlorthalidone -Repleted  Hyponatremia -Due to chlorthalidone -Recommend repeat BMP in 1 week after discharge     Discharge Instructions  Discharge Instructions    Diet - low sodium heart healthy   Complete by:  As directed    Increase activity slowly   Complete by:  As directed      Allergies as of 12/20/2016      Reactions   Lisinopril Cough      Medication List    STOP taking these medications   diphenoxylate-atropine 2.5-0.025 MG tablet Commonly known as:  LOMOTIL   hydrochlorothiazide 12.5 MG capsule Commonly known as:  MICROZIDE   labetalol 200 MG tablet Commonly known as:  NORMODYNE     TAKE these medications   acetaminophen 500 MG tablet Commonly known as:  TYLENOL Take 500 mg every 6 (six) hours as needed by mouth for mild pain or moderate pain.   amLODipine 10 MG tablet Commonly known as:  NORVASC Take 10 mg by mouth daily.   calcium carbonate 500 MG chewable tablet Commonly known as:  TUMS -  dosed in mg elemental calcium Chew 1 tablet daily as needed by mouth for indigestion or heartburn.   chlorthalidone 25 MG tablet Commonly known as:  HYGROTON Take 25 mg by mouth daily.   cloNIDine 0.1 MG  tablet Commonly known as:  CATAPRES Take 1 po for BP over 200 systolic, if BP is not improved in 1 hour may repeat What changed:    how much to take  how to take this  when to take this  reasons to take this  additional instructions   hydrALAZINE 100 MG tablet Commonly known as:  APRESOLINE Take 100 mg by mouth every 8 (eight) hours.   metoprolol succinate 50 MG 24 hr tablet Commonly known as:  TOPROL-XL Take 1 tablet (50 mg total) daily by mouth. Take with or immediately following a meal.   OMEGA-3 FISH OIL PO Take 1 capsule daily by mouth.   PROBIOTIC FORMULA PO Take 1 capsule daily by mouth.   spironolactone 25 MG tablet Commonly known as:  ALDACTONE Take 25 mg by mouth daily.       Allergies  Allergen Reactions  . Lisinopril Cough    Consultations:  none   Procedures/Studies: Dg Chest 2 View  Result Date: 12/19/2016 CLINICAL DATA:  Left-sided chest tightness and shortness of breath since 12/18/2016. History of hypertension. EXAM: CHEST  2 VIEW COMPARISON:  07/26/2016 FINDINGS: Normal heart size and pulmonary vascularity. No focal airspace disease or consolidation in the lungs. No blunting of costophrenic angles. No pneumothorax. Mediastinal contours appear intact. Tortuous aorta. Degenerative changes in the spine. IMPRESSION: No active cardiopulmonary disease. Electronically Signed   By: Burman NievesWilliam  Stevens M.D.   On: 12/19/2016 00:12   Ct Head Wo Contrast  Result Date: 12/19/2016 CLINICAL DATA:  High blood pressure. Left-sided chest tightness. Increased tingling in the left upper extremity and increased weakness in the left lower extremity. EXAM: CT HEAD WITHOUT CONTRAST TECHNIQUE: Contiguous axial images were obtained from the base of the skull through the vertex without intravenous contrast. COMPARISON:  07/26/2016 FINDINGS: Brain: Ventricles and sulci appear symmetrical without significant atrophy. There is patchy heterogeneous low-attenuation change  throughout the deep white matter bilaterally. This could represent small vessel ischemic change although other white matter disease could also have this appearance. Consider vasculitis or demyelinating process such as MS. MRI may be useful in further evaluation if clinically indicated. No mass effect or midline shift. No abnormal extra-axial fluid collections. Gray-white matter junctions are distinct. Basal cisterns are not effaced. No acute intracranial hemorrhage. Vascular: No hyperdense vessel or unexpected calcification. Skull: Normal. Negative for fracture or focal lesion. Sinuses/Orbits: No acute finding. Other: None. IMPRESSION: Patchy low-attenuation changes throughout the deep white matter without associated atrophy. Changes may represent small vessel ischemia versus other demyelinating process. Consider MS or vasculitis. No acute intracranial abnormalities. Electronically Signed   By: Burman NievesWilliam  Stevens M.D.   On: 12/19/2016 00:19        Discharge Exam: Vitals:   12/19/16 2233 12/20/16 0548  BP: 133/89 (!) 131/93  Pulse: 65 (!) 56  Resp: 18   Temp:  98.3 F (36.8 C)  SpO2: 99% 99%   Vitals:   12/19/16 1445 12/19/16 2053 12/19/16 2233 12/20/16 0548  BP: 124/79  133/89 (!) 131/93  Pulse:   65 (!) 56  Resp:   18   Temp:    98.3 F (36.8 C)  TempSrc:    Oral  SpO2:  94% 99% 99%  Weight:      Height:  General: Pt is alert, awake, not in acute distress Cardiovascular: RRR, S1/S2 +, no rubs, no gallops Respiratory: CTA bilaterally, no wheezing, no rhonchi Abdominal: Soft, NT, ND, bowel sounds + Extremities: no edema, no cyanosis   The results of significant diagnostics from this hospitalization (including imaging, microbiology, ancillary and laboratory) are listed below for reference.    Significant Diagnostic Studies: Dg Chest 2 View  Result Date: 12/19/2016 CLINICAL DATA:  Left-sided chest tightness and shortness of breath since 12/18/2016. History of hypertension.  EXAM: CHEST  2 VIEW COMPARISON:  07/26/2016 FINDINGS: Normal heart size and pulmonary vascularity. No focal airspace disease or consolidation in the lungs. No blunting of costophrenic angles. No pneumothorax. Mediastinal contours appear intact. Tortuous aorta. Degenerative changes in the spine. IMPRESSION: No active cardiopulmonary disease. Electronically Signed   By: Burman NievesWilliam  Stevens M.D.   On: 12/19/2016 00:12   Ct Head Wo Contrast  Result Date: 12/19/2016 CLINICAL DATA:  High blood pressure. Left-sided chest tightness. Increased tingling in the left upper extremity and increased weakness in the left lower extremity. EXAM: CT HEAD WITHOUT CONTRAST TECHNIQUE: Contiguous axial images were obtained from the base of the skull through the vertex without intravenous contrast. COMPARISON:  07/26/2016 FINDINGS: Brain: Ventricles and sulci appear symmetrical without significant atrophy. There is patchy heterogeneous low-attenuation change throughout the deep white matter bilaterally. This could represent small vessel ischemic change although other white matter disease could also have this appearance. Consider vasculitis or demyelinating process such as MS. MRI may be useful in further evaluation if clinically indicated. No mass effect or midline shift. No abnormal extra-axial fluid collections. Gray-white matter junctions are distinct. Basal cisterns are not effaced. No acute intracranial hemorrhage. Vascular: No hyperdense vessel or unexpected calcification. Skull: Normal. Negative for fracture or focal lesion. Sinuses/Orbits: No acute finding. Other: None. IMPRESSION: Patchy low-attenuation changes throughout the deep white matter without associated atrophy. Changes may represent small vessel ischemia versus other demyelinating process. Consider MS or vasculitis. No acute intracranial abnormalities. Electronically Signed   By: Burman NievesWilliam  Stevens M.D.   On: 12/19/2016 00:19     Microbiology: No results found for  this or any previous visit (from the past 240 hour(s)).   Labs: Basic Metabolic Panel: Recent Labs  Lab 12/18/16 2347  NA 133*  K 3.1*  CL 100*  CO2 22  GLUCOSE 138*  BUN 24*  CREATININE 1.37*  CALCIUM 9.0  MG 1.8  PHOS 3.1   Liver Function Tests: Recent Labs  Lab 12/18/16 2347  AST 16  ALT 14  ALKPHOS 56  BILITOT 0.5  PROT 7.8  ALBUMIN 4.0   No results for input(s): LIPASE, AMYLASE in the last 168 hours. No results for input(s): AMMONIA in the last 168 hours. CBC: Recent Labs  Lab 12/18/16 2347  WBC 12.2*  HGB 12.3  HCT 38.3  MCV 84.5  PLT 248   Cardiac Enzymes: Recent Labs  Lab 12/18/16 2347 12/19/16 0625 12/19/16 1158  TROPONINI <0.03 <0.03 <0.03   BNP: Invalid input(s): POCBNP CBG: No results for input(s): GLUCAP in the last 168 hours.  Time coordinating discharge:  Greater than 30 minutes  Signed:  Chanique Duca, DO [facility] Pager: [phone] 12/20/2016, 7:54 AM

## 2016-12-20 NOTE — Plan of Care (Signed)
progressing 

## 2016-12-20 NOTE — Progress Notes (Signed)
Pt discharged home today per Dr. Tat. Pt's IV site D/C'd and WDL. Pt's VSS. Pt provided with home medication list, discharge instructions and prescriptions. Verbalized understanding. Pt left floor via WC in stable condition accompanied by NT. 

## 2016-12-20 NOTE — Care Management Note (Signed)
Case Management Note  Patient Details  Name: Valerie RoysJeanetta Benda MRN: [id number] Date of Birth: October 19, 1950     Expected Discharge Date:  12/20/16               Expected Discharge Plan:  Home/Self Care  In-House Referral:     Discharge planning Services  Other - See comment(PCP list)  Post Acute Care Choice:    Choice offered to:     DME Arranged:    DME Agency:     HH Arranged:    HH Agency:     Status of Service:  Completed, signed off  If discussed at [employer] Length of Stay Meetings, dates discussed:    Additional Comments: Patient discharging home today. Will need to establish care with PCP. [place]. Patient given list of providers accepting new patients. She is aware that she needs to call today to establish care and for follow up. She verbalized understanding. No other CM needs.   Dalary Hollar, Chrystine OilerSharley Diane, RN 12/20/2016, 10:58 AM

## 2017-01-18 ENCOUNTER — Encounter (HOSPITAL_COMMUNITY): Payer: Self-pay | Admitting: Emergency Medicine

## 2017-01-18 ENCOUNTER — Emergency Department (HOSPITAL_COMMUNITY)
Admission: EM | Admit: 2017-01-18 | Discharge: 2017-01-19 | Disposition: A | Discharge status: Home / Self Care | Payer: BLUE CROSS/BLUE SHIELD | Attending: Emergency Medicine | Admitting: Emergency Medicine

## 2017-01-18 ENCOUNTER — Other Ambulatory Visit: Payer: Self-pay

## 2017-01-18 DIAGNOSIS — I10 Essential (primary) hypertension: Secondary | ICD-10-CM | POA: Insufficient documentation

## 2017-01-18 DIAGNOSIS — Z79899 Other long term (current) drug therapy: Secondary | ICD-10-CM | POA: Diagnosis not present

## 2017-01-18 NOTE — ED Triage Notes (Signed)
Pt states her blood pressure was over 200 systolic at home since 1700. Pt c/o intermittent tingling in bilateral hands. Pt states she took 2 emergency bp pills but did not effect the pressure.

## 2017-01-19 NOTE — ED Notes (Signed)
Pt ambulatory to waiting room. Pt verbalized understanding of discharge instructions.   

## 2017-01-19 NOTE — Discharge Instructions (Signed)
Call your pharmacy in the morning to see if your amlodipine is part of the recall, if not restart it tomorrow. If it is, call your doctor's office to see how they want to manage your blood pressure pills, they may increase the dose or add a new medication. Recheck if you get worse.

## 2017-01-19 NOTE — ED Provider Notes (Addendum)
[facility] EMERGENCY DEPARTMENT Provider Note   CSN: [id number] Arrival date & time: 01/18/17  2213  Time seen 23:30 PM sdf   History   Chief Complaint Chief Complaint  Patient presents with  . Hypertension    HPI Valerie RoysJeanetta Peppel is a 66 y.o. female.  HPI patient has a history of stroke and admissions for hypertensive crisis.  She states about 5 PM she felt a tingling in her left hand and when she checked her blood pressure it was over 200/126.  She took a clonidine about the same time.  At 6 PM her blood pressure was still high and she took a second clonidine.  She states at that time she was still having the tingling.  She denies headache, nausea, vomiting, blurred vision, difficulty walking, chest pain, shortness of breath,.  She states she is normally weak in her left hand and felt like it was weaker tonight.  She was however able to walk without difficulty.  She states currently she feels back to normal.  She states that she saw there was a recall on the amlodipine and she stopped taking it 2 days ago.  She normally takes it at 3 PM.  She takes hydralazine around 5 PM and spironolactone at 3 PM and she did take those today.  [place]  Past Medical History:  Diagnosis Date  . Hyperlipidemia   . Hypertension   . Stroke [facility](HCC)     Patient Active Problem List   Diagnosis Date Noted  . Essential hypertension   . Chest pain 12/19/2016  . Abnormal CT of brain 12/19/2016  . Hypertensive emergency 07/26/2016  . Stroke (HCC) 07/26/2016  . Hypokalemia 07/26/2016  . Hyperlipidemia 07/26/2016  . Chest discomfort 07/26/2016    History reviewed. No pertinent surgical history.  OB History    No data available       Home Medications    Prior to Admission medications   Medication Sig Start Date End Date Taking? Authorizing Provider  acetaminophen (TYLENOL) 500 MG tablet Take 500 mg every 6 (six) hours as needed by mouth for mild pain or moderate pain.   Yes  [provider]  amLODipine (NORVASC) 10 MG tablet Take 10 mg by mouth daily. 08/21/16  Yes [provider]  calcium carbonate (TUMS - DOSED IN MG ELEMENTAL CALCIUM) 500 MG chewable tablet Chew 1 tablet daily as needed by mouth for indigestion or heartburn.   Yes [provider]  chlorthalidone (HYGROTON) 25 MG tablet Take 25 mg by mouth daily. 09/04/16  Yes [provider]  cloNIDine (CATAPRES) 0.1 MG tablet Take 1 po for BP over 200 systolic, if BP is not improved in 1 hour may repeat Patient taking differently: Take 0.1 mg daily as needed by mouth. Take 1 po for BP over 200 systolic, if BP is not improved in 1 hour may repeat 07/29/16  Yes Devoria AlbeKnapp, Judiann Celia, MD  hydrALAZINE (APRESOLINE) 100 MG tablet Take 100 mg by mouth every 8 (eight) hours.  07/21/16  Yes [provider]  metoprolol succinate (TOPROL-XL) 50 MG 24 hr tablet Take 1 tablet (50 mg total) daily by mouth. Take with or immediately following a meal. 12/20/16  Yes Tat, David, MD  Omega-3 Fatty Acids (OMEGA-3 FISH OIL PO) Take 1 capsule daily by mouth.   Yes [provider]  Probiotic Product (PROBIOTIC FORMULA PO) Take 1 capsule daily by mouth.   Yes [provider]  spironolactone (ALDACTONE) 25 MG tablet Take  25 mg by mouth daily. 09/04/16  Yes [provider]    Family History Family History  Problem Relation Age of Onset  . Hypertension Mother   . Hypertension Father   . Arthritis Father   . Stroke Paternal Aunt     Social History Social History   Tobacco Use  . Smoking status: Never Smoker  . Smokeless tobacco: Never Used  Substance Use Topics  . Alcohol use: No  . Drug use: No  unemployed, denied disability   Allergies   Lisinopril   Review of Systems Review of Systems  All other systems reviewed and are negative.    Physical Exam Updated Vital Signs BP (!) 189/105   Pulse 75   Temp 98.1 F (36.7 C)   Resp 18   Ht 5\' 2"  (1.575 m)   Wt 85.7  kg (189 lb)   SpO2 94%   BMI 34.57 kg/m   Vital signs normal except hypertension  At the time of my exam her blood pressure was 158/107   Physical Exam  Constitutional: She is oriented to person, place, and time. She appears well-developed and well-nourished.  Non-toxic appearance. She does not appear ill. No distress.  HENT:  Head: Normocephalic and atraumatic.  Right Ear: External ear normal.  Left Ear: External ear normal.  Nose: Nose normal. No mucosal edema or rhinorrhea.  Mouth/Throat: Oropharynx is clear and moist and mucous membranes are normal. No dental abscesses or uvula swelling.  Eyes: Conjunctivae and EOM are normal. Pupils are equal, round, and reactive to light.  Neck: Normal range of motion and full passive range of motion without pain. Neck supple.  Cardiovascular: Normal rate, regular rhythm and normal heart sounds. Exam reveals no gallop and no friction rub.  No murmur heard. Pulmonary/Chest: Effort normal and breath sounds normal. No respiratory distress. She has no wheezes. She has no rhonchi. She has no rales. She exhibits no tenderness and no crepitus.  Abdominal: Soft. Normal appearance and bowel sounds are normal. She exhibits no distension. There is no tenderness. There is no rebound and no guarding.  Musculoskeletal: Normal range of motion. She exhibits no edema or tenderness.  Moves all extremities well.   Neurological: She is alert and oriented to person, place, and time. She has normal strength. No cranial nerve deficit.  Grips equal with encouragement  Skin: Skin is warm, dry and intact. No rash noted. No erythema. No pallor.  Psychiatric: She has a normal mood and affect. Her speech is normal and behavior is normal. Her mood appears not anxious.  Nursing note and vitals reviewed.    ED Treatments / Results  Labs (all labs ordered are listed, but only abnormal results are displayed) Labs Reviewed - No data to display  EKG  EKG  Interpretation None       Radiology No results found.  Procedures Procedures (including critical care time)  Medications Ordered in ED Medications - No data to display   Initial Impression / Assessment and Plan / ED Course  I have reviewed the triage vital signs and the nursing notes.  Pertinent labs & imaging results that were available during my care of the patient were reviewed by me and considered in my medical decision making (see chart for details).    I looked at her amlodipine pill bottle and cannot tell what company makes it.  When I look online it looks like mainly the combination amlodipine and losartan pills are the ones that have been recalled.  Talked to the patient about contacting her pharmacy tomorrow and see if her prescription is actually part of the recall.  If so she should contact her primary care doctor to see how they want to manage her blood pressure without the amlodipine.  If not she should restart it.  12:30 AM blood pressure is 156/93.  Patient remains asymptomatic.  She was discharged home.  Final Clinical Impressions(s) / ED Diagnoses   Final diagnoses:  Essential hypertension    ED Discharge Orders    None      Plan discharge  Devoria AlbeIva Ronya Gilcrest, MD, Concha PyoFACEP    Levar Fayson, MD 01/19/17 [id number]    Devoria AlbeKnapp, Leanard Dimaio, MD 01/19/17 0700

## 2017-07-16 DIAGNOSIS — Z8673 Personal history of transient ischemic attack (TIA), and cerebral infarction without residual deficits: Secondary | ICD-10-CM | POA: Insufficient documentation

## 2017-07-16 DIAGNOSIS — R531 Weakness: Secondary | ICD-10-CM | POA: Insufficient documentation

## 2017-07-26 DIAGNOSIS — N183 Chronic kidney disease, stage 3 unspecified: Secondary | ICD-10-CM | POA: Insufficient documentation

## 2017-12-10 ENCOUNTER — Other Ambulatory Visit (HOSPITAL_COMMUNITY): Payer: Self-pay | Admitting: Internal Medicine

## 2017-12-10 ENCOUNTER — Ambulatory Visit (HOSPITAL_COMMUNITY)
Admission: RE | Admit: 2017-12-10 | Discharge: 2017-12-10 | Disposition: A | Discharge status: Home / Self Care | Payer: Medicare Other | Source: Ambulatory Visit | Attending: Internal Medicine | Admitting: Internal Medicine

## 2017-12-10 DIAGNOSIS — R05 Cough: Secondary | ICD-10-CM | POA: Insufficient documentation

## 2017-12-10 DIAGNOSIS — R059 Cough, unspecified: Secondary | ICD-10-CM

## 2017-12-12 ENCOUNTER — Other Ambulatory Visit (HOSPITAL_COMMUNITY): Payer: Self-pay | Admitting: Internal Medicine

## 2017-12-12 DIAGNOSIS — Z1231 Encounter for screening mammogram for malignant neoplasm of breast: Secondary | ICD-10-CM

## 2017-12-13 ENCOUNTER — Encounter: Payer: Self-pay | Admitting: *Deleted

## 2017-12-19 ENCOUNTER — Encounter: Payer: Self-pay | Admitting: *Deleted

## 2017-12-27 ENCOUNTER — Ambulatory Visit (HOSPITAL_COMMUNITY): Payer: Medicare Other

## 2017-12-31 ENCOUNTER — Encounter (HOSPITAL_COMMUNITY): Payer: Self-pay

## 2017-12-31 ENCOUNTER — Ambulatory Visit (HOSPITAL_COMMUNITY)
Admission: RE | Admit: 2017-12-31 | Discharge: 2017-12-31 | Disposition: A | Discharge status: Home / Self Care | Payer: Medicare Other | Source: Ambulatory Visit | Attending: Internal Medicine | Admitting: Internal Medicine

## 2017-12-31 DIAGNOSIS — Z1231 Encounter for screening mammogram for malignant neoplasm of breast: Secondary | ICD-10-CM | POA: Diagnosis present

## 2018-01-02 ENCOUNTER — Other Ambulatory Visit (HOSPITAL_COMMUNITY): Payer: Self-pay | Admitting: Internal Medicine

## 2018-01-02 DIAGNOSIS — R921 Mammographic calcification found on diagnostic imaging of breast: Secondary | ICD-10-CM

## 2018-01-03 ENCOUNTER — Other Ambulatory Visit (HOSPITAL_COMMUNITY): Payer: Self-pay | Admitting: Internal Medicine

## 2018-01-03 DIAGNOSIS — R921 Mammographic calcification found on diagnostic imaging of breast: Secondary | ICD-10-CM

## 2018-01-29 ENCOUNTER — Ambulatory Visit (HOSPITAL_COMMUNITY)
Admission: RE | Admit: 2018-01-29 | Discharge: 2018-01-29 | Disposition: A | Discharge status: Home / Self Care | Payer: Medicare Other | Source: Ambulatory Visit | Attending: Internal Medicine | Admitting: Internal Medicine

## 2018-01-29 DIAGNOSIS — R921 Mammographic calcification found on diagnostic imaging of breast: Secondary | ICD-10-CM | POA: Insufficient documentation

## 2018-03-19 IMAGING — DX DG CHEST 2V
2 series · 2 of 2 positions shown · non-contrast
Comparison: 07/26/2016

CLINICAL DATA: Left-sided chest tightness and shortness of breath
since 12/18/2016. History of hypertension.

EXAM:
CHEST  2 VIEW

[chest pa]
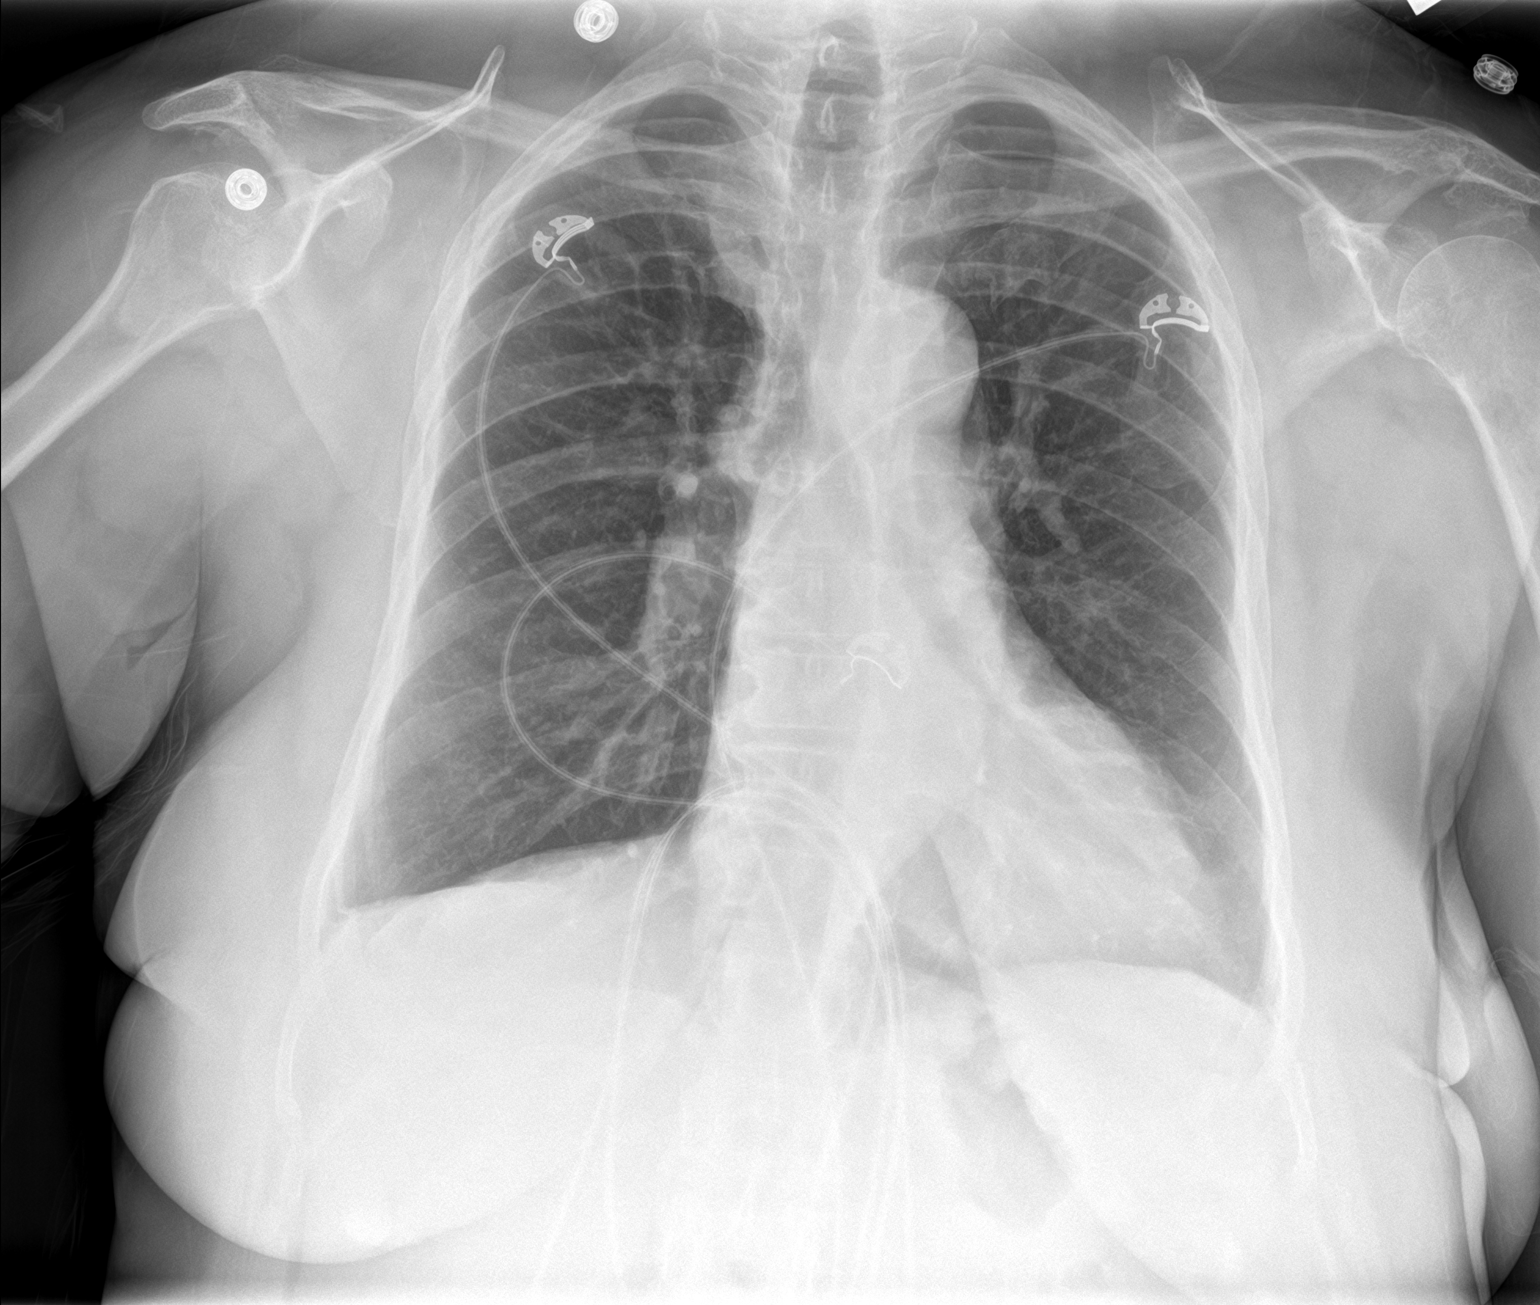

[chest lat]
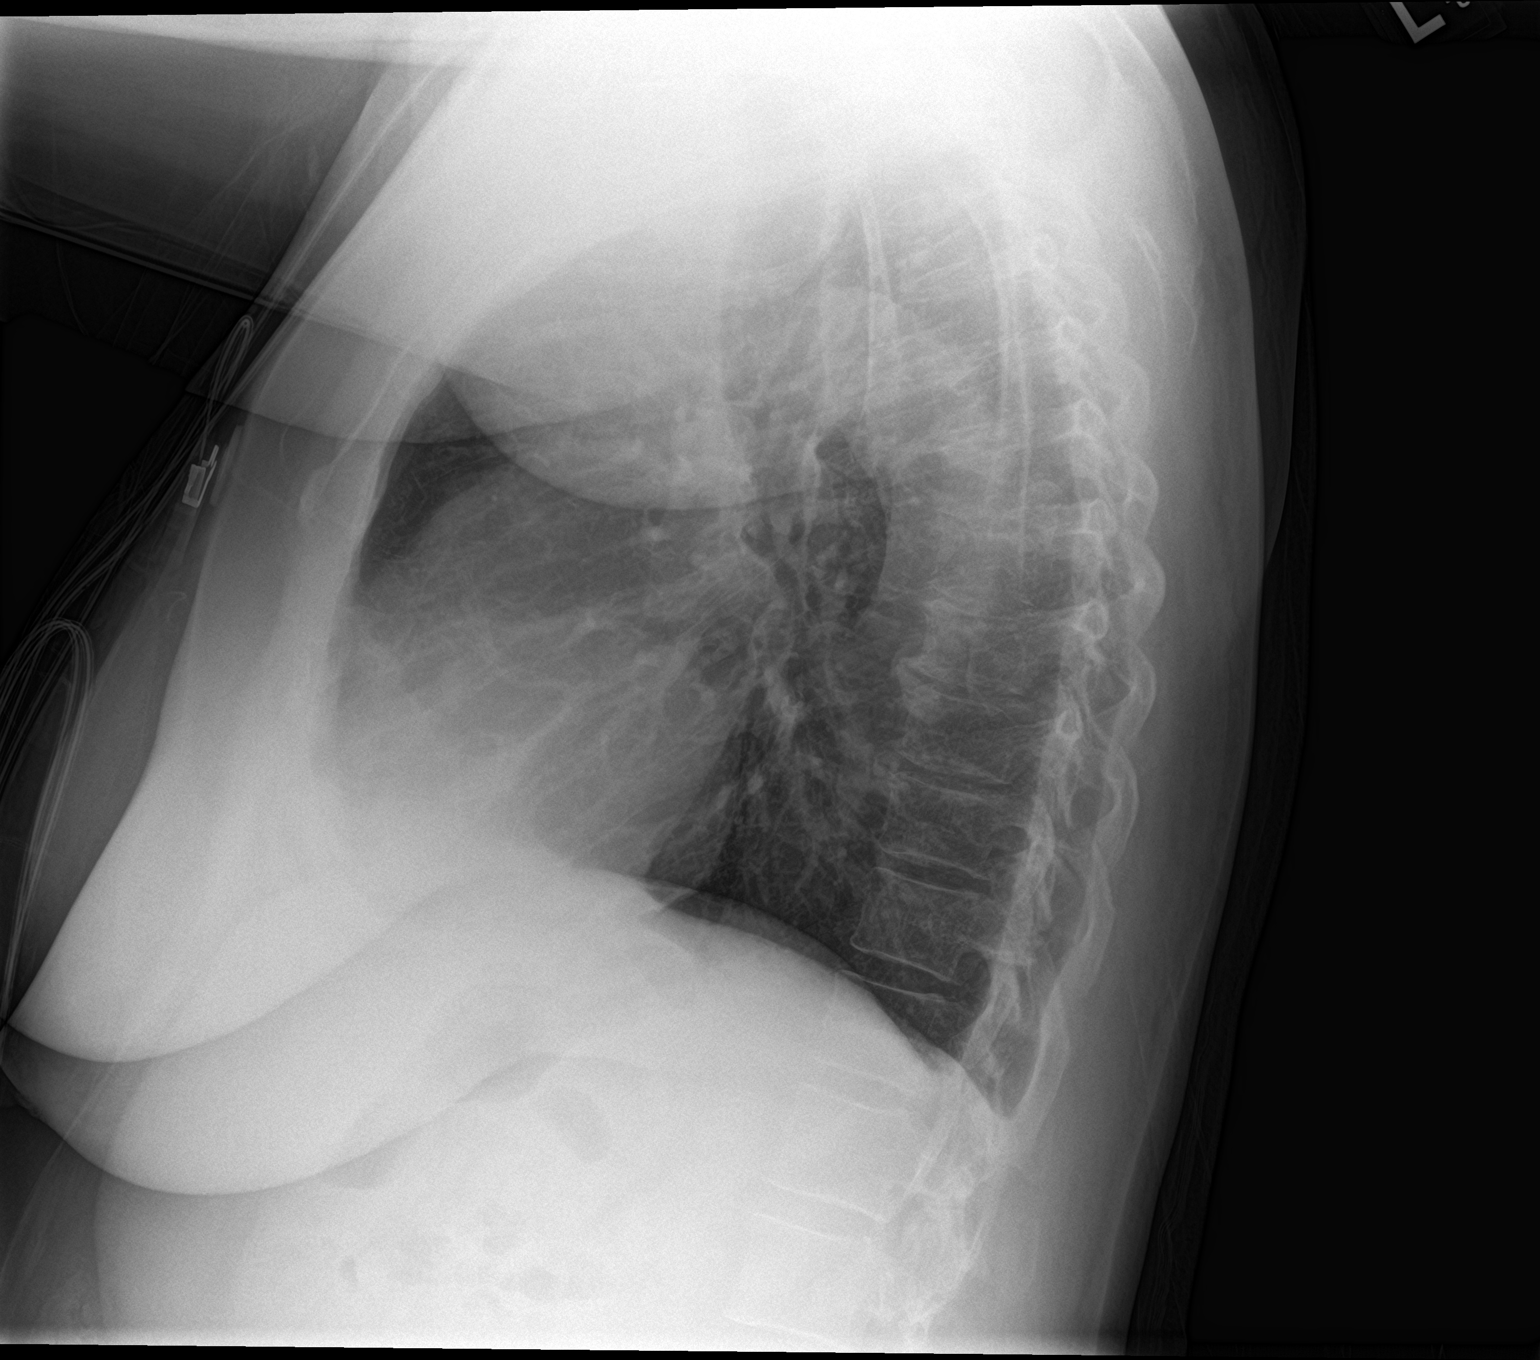

[2 of 2 positions shown; findings below may reference images not displayed]

FINDINGS: Normal heart size and pulmonary vascularity. No focal airspace
disease or consolidation in the lungs. No blunting of costophrenic
angles. No pneumothorax. Mediastinal contours appear intact.
Tortuous aorta. Degenerative changes in the spine.
IMPRESSION: No active cardiopulmonary disease.

## 2018-06-03 ENCOUNTER — Ambulatory Visit (INDEPENDENT_AMBULATORY_CARE_PROVIDER_SITE_OTHER): Payer: Medicare Other | Admitting: Nurse Practitioner

## 2018-11-24 ENCOUNTER — Other Ambulatory Visit (INDEPENDENT_AMBULATORY_CARE_PROVIDER_SITE_OTHER): Payer: Self-pay | Admitting: Internal Medicine

## 2018-12-31 ENCOUNTER — Other Ambulatory Visit (INDEPENDENT_AMBULATORY_CARE_PROVIDER_SITE_OTHER): Payer: Self-pay

## 2018-12-31 ENCOUNTER — Other Ambulatory Visit: Payer: Self-pay

## 2018-12-31 ENCOUNTER — Encounter (INDEPENDENT_AMBULATORY_CARE_PROVIDER_SITE_OTHER): Payer: Self-pay | Admitting: Internal Medicine

## 2018-12-31 ENCOUNTER — Telehealth (INDEPENDENT_AMBULATORY_CARE_PROVIDER_SITE_OTHER): Payer: Self-pay

## 2018-12-31 ENCOUNTER — Other Ambulatory Visit (INDEPENDENT_AMBULATORY_CARE_PROVIDER_SITE_OTHER): Payer: Self-pay | Admitting: Internal Medicine

## 2018-12-31 ENCOUNTER — Ambulatory Visit (INDEPENDENT_AMBULATORY_CARE_PROVIDER_SITE_OTHER): Payer: Medicare Other | Admitting: Internal Medicine

## 2018-12-31 VITALS — BP 190/110 | HR 72 | Ht 62.0 in | Wt 199.0 lb

## 2018-12-31 DIAGNOSIS — R072 Precordial pain: Secondary | ICD-10-CM

## 2018-12-31 DIAGNOSIS — I10 Essential (primary) hypertension: Secondary | ICD-10-CM

## 2018-12-31 DIAGNOSIS — E559 Vitamin D deficiency, unspecified: Secondary | ICD-10-CM | POA: Diagnosis not present

## 2018-12-31 HISTORY — DX: Vitamin D deficiency, unspecified: E55.9

## 2018-12-31 MED ORDER — METOPROLOL TARTRATE 25 MG PO TABS: 25.0000 mg | ORAL_TABLET | Freq: Two times a day (BID) | ORAL | 3 refills | Status: DC

## 2018-12-31 MED ORDER — HYDRALAZINE HCL 100 MG PO TABS: 100.0000 mg | ORAL_TABLET | Freq: Two times a day (BID) | ORAL | 1 refills | Status: DC

## 2018-12-31 MED ORDER — SPIRONOLACTONE 25 MG PO TABS: 25.0000 mg | ORAL_TABLET | Freq: Every day | ORAL | 1 refills | Status: DC

## 2018-12-31 MED ORDER — CHLORTHALIDONE 25 MG PO TABS: 25.0000 mg | ORAL_TABLET | Freq: Every day | ORAL | 1 refills | Status: DC

## 2018-12-31 MED ORDER — AMLODIPINE BESYLATE 10 MG PO TABS: 10.0000 mg | ORAL_TABLET | Freq: Every day | ORAL | 1 refills | Status: DC

## 2018-12-31 MED ORDER — CLONIDINE HCL 0.2 MG PO TABS: 0.2000 mg | ORAL_TABLET | Freq: Two times a day (BID) | ORAL | 0 refills | Status: DC

## 2018-12-31 NOTE — Progress Notes (Signed)
Metrics: Intervention Frequency ACO  Documented Smoking Status Yearly  Screened one or more times in 24 months  Cessation Counseling or  Active cessation medication Past 24 months  Past 24 months   Guideline developer: UpToDate (See UpToDate for funding source) Date Released: 2014       Wellness Office Visit  Subjective:  Patient ID: Danielle Jordan, female    DOB: [birth year]  Age: 68 y.o. MRN: [id number]  CC: This lady comes in for follow-up of hypertension, hyperlipidemia, vitamin D deficiency and history of cerebrovascular disease. HPI We have not seen her in the practice since January of this year and I am not sure why.  She says it is related to COVID-19 pandemic. She is compliant she says with all her medications for hypertension.  She denies any headache, chest pain, dyspnea or new limb weakness. She is complaining of epigastric and chest heaviness/discomfort usually every morning when she wakes up and it apparently last for hours.  It does not seem to be related to exertion particularly. I am not sure is related to food although she feels that if she burped, this would release her symptoms.  Past Medical History:  Diagnosis Date  . Hyperlipidemia   . Hypertension   . Stroke ([place])   . Vitamin D deficiency disease 12/31/2018      Family History  Problem Relation Age of Onset  . Hypertension Mother   . Hypertension Father   . Arthritis Father   . Stroke Paternal Aunt     Social History   Social History Narrative    Not working at this time. Has 3 boys and 1 girl. Divorced. Lives with daughter   Social History   Tobacco Use  . Smoking status: Never Smoker  . Smokeless tobacco: Never Used  Substance Use Topics  . Alcohol use: No    Current Meds  Medication Sig  . amLODipine (NORVASC) 10 MG tablet Take 10 mg by mouth daily.  . chlorthalidone (HYGROTON) 25 MG tablet Take 25 mg by mouth daily.  . cloNIDine (CATAPRES) 0.2 MG tablet Take 1 tablet by mouth twice  daily  . hydrALAZINE (APRESOLINE) 100 MG tablet Take 100 mg by mouth 2 (two) times daily.   Marland Kitchen spironolactone (ALDACTONE) 25 MG tablet Take 25 mg by mouth daily.       Objective:   Today's Vitals: BP (!) 190/110   Pulse 72   Ht 5' 2"  (1.575 m)   Wt 199 lb (90.3 kg)   BMI 36.40 kg/m  Vitals with BMI 12/31/2018 01/19/2017 01/19/2017  Height 5' 2"  - -  Weight 199 lbs - -  BMI 36.14 - -  Systolic 431 540 086  Diastolic 761 91 93  Pulse 72 72 -     Physical Exam   Her blood pressure is not controlled well at all and is extremely elevated.  She is however alert and orientated without any new focal neurological signs.    Assessment   1. Essential hypertension   2. Vitamin D deficiency disease   3. Precordial pain       Tests ordered Orders Placed This Encounter  Procedures  . CMP with eGFR([facility])  . Vitamin D, 25-hydroxy  . Ambulatory referral to Cardiology     Plan: 1. I am going to add metoprolol to her antihypertensive regimen and hopefully this will help her blood pressure become under medic control. 2. She will be referred to cardiology for further evaluation of the chest discomfort.  She certainly  is at high risk for cardiac disease but her symptoms are not quite typical of cardiac discomfort but it is possible. 3. She was offered influenza vaccination but she declined. 4. Blood work is ordered. 5. Further recommendations will depend on blood results and I will have her follow-up soon in about a month's time with Sarah to monitor her blood pressure.   Meds ordered this encounter  Medications  . metoprolol tartrate (LOPRESSOR) 25 MG tablet    Sig: Take 1 tablet (25 mg total) by mouth 2 (two) times daily.    Dispense:  60 tablet    Refill:  3    Nimish Luther Parody, MD

## 2018-12-31 NOTE — Progress Notes (Unsigned)
Pt dtr called and gave listr of all medications needing refill today. Pt has only a few left remaining at home. Send to [facility]

## 2019-01-01 LAB — COMPLETE METABOLIC PANEL WITH GFR
AG Ratio: 1.3 (calc) (ref 1.0–2.5)
ALT: 15 U/L (ref 6–29)
AST: 18 U/L (ref 10–35)
Albumin: 4 g/dL (ref 3.6–5.1)
Alkaline phosphatase (APISO): 58 U/L (ref 37–153)
BUN/Creatinine Ratio: 15 (calc) (ref 6–22)
BUN: 27 mg/dL — ABNORMAL HIGH (ref 7–25)
CO2: 23 mmol/L (ref 20–32)
Calcium: 9.7 mg/dL (ref 8.6–10.4)
Chloride: 106 mmol/L (ref 98–110)
Creat: 1.75 mg/dL — ABNORMAL HIGH (ref 0.50–0.99)
GFR, Est African American: 35 mL/min/{1.73_m2} — ABNORMAL LOW (ref >=60)
GFR, Est Non African American: 30 mL/min/{1.73_m2} — ABNORMAL LOW (ref >=60)
Globulin: 3.1 g/dL (calc) (ref 1.9–3.7)
Glucose, Bld: 110 mg/dL — ABNORMAL HIGH (ref 65–99)
Potassium: 4.2 mmol/L (ref 3.5–5.3)
Sodium: 141 mmol/L (ref 135–146)
Total Bilirubin: 0.5 mg/dL (ref 0.2–1.2)
Total Protein: 7.1 g/dL (ref 6.1–8.1)

## 2019-01-01 LAB — VITAMIN D 25 HYDROXY (VIT D DEFICIENCY, FRACTURES): Vit D, 25-Hydroxy: 13 ng/mL — ABNORMAL LOW (ref 30–100)

## 2019-01-02 NOTE — Telephone Encounter (Signed)
Dr was called and given lab results. Dtr was given instructions to make sure mother is getting water intake increase. To help improve kidneys intake& output. She will also make sure she is taking all medications as directed.

## 2019-02-03 ENCOUNTER — Ambulatory Visit: Payer: Medicare Other | Admitting: Cardiovascular Disease

## 2019-02-04 ENCOUNTER — Other Ambulatory Visit: Payer: Self-pay

## 2019-02-04 ENCOUNTER — Encounter (INDEPENDENT_AMBULATORY_CARE_PROVIDER_SITE_OTHER): Payer: Self-pay | Admitting: Nurse Practitioner

## 2019-02-04 ENCOUNTER — Ambulatory Visit (INDEPENDENT_AMBULATORY_CARE_PROVIDER_SITE_OTHER): Payer: Medicare Other | Admitting: Nurse Practitioner

## 2019-02-04 VITALS — BP 160/100 | HR 79 | Temp 98.8°F | Resp 14 | Ht 62.0 in | Wt 196.2 lb

## 2019-02-04 DIAGNOSIS — I10 Essential (primary) hypertension: Secondary | ICD-10-CM | POA: Diagnosis not present

## 2019-02-04 DIAGNOSIS — G459 Transient cerebral ischemic attack, unspecified: Secondary | ICD-10-CM

## 2019-02-04 DIAGNOSIS — N189 Chronic kidney disease, unspecified: Secondary | ICD-10-CM | POA: Insufficient documentation

## 2019-02-04 DIAGNOSIS — E785 Hyperlipidemia, unspecified: Secondary | ICD-10-CM

## 2019-02-04 DIAGNOSIS — N1832 Chronic kidney disease, stage 3b: Secondary | ICD-10-CM

## 2019-02-04 DIAGNOSIS — Z8673 Personal history of transient ischemic attack (TIA), and cerebral infarction without residual deficits: Secondary | ICD-10-CM

## 2019-02-04 NOTE — Patient Instructions (Signed)
Thank you for choosing [facility] as your medical provider! If you have any questions or concerns regarding your health care, please do not hesitate to call our office.  Hypertension: Start taking metoprolol in the evening as well as in the morning.  Continue monitoring her blood pressure at home.  Call this office if you feel unwell once you start adding metoprolol in the evening.  Below is your medication as scheduled that we discussed in the office.  The only change is adding metoprolol at bedtime  Medication schedule:  7AM Aspirin Spironolactone Clonidine Hydralazine Metoprolol  3PM Hydralazine Amlodipine Clonidine Chlorthalidone  Bedtime Hydralazine Metoprolol  Hyperlipidemia, possible TIA: I will collect blood work today for further evaluation.  I will most likely call you later this week to discuss this.  Please bring in copies of your paperwork from your most recent hospitalization.  Chronic kidney disease: We will be checking your urine and blood work for further evaluation regarding your kidneys.  We may need to refer you to a nephrologist for further evaluation.  Please follow-up as scheduled in 2 weeks. We look forward to seeing you again soon! Have a great Christmas!!  At [facility] we value your feedback. You may receive a survey about your visit today. Please share your experience as we strive to create trusting relationships with our patients to provide genuine, compassionate, quality care.  We appreciate your understanding and patience as we review any laboratory studies, imaging, and other diagnostic tests that are ordered as we care for you. We do our best to address any and all results in a timely manner. If you do not hear about test results within 1 week, please do not hesitate to contact us. If we referred you to a specialist during your visit or ordered imaging testing, contact the office if you have not been contacted to be scheduled  within 1 weeks.  We also encourage the use of MyChart, which contains your medical information for your review as well. If you are not enrolled in this feature, an access code is on this after visit summary for your convenience. Thank you for allowing [place] to be involved in your care.

## 2019-02-04 NOTE — Assessment & Plan Note (Signed)
I have told her her metoprolol as prescribed to be taken twice a day.  She will start doing this.  I have written out a medication schedule for her to take when she is at home.  She was carried to continue monitoring her blood pressure at home.  She will follow-up in 2 weeks for blood pressure check and to see if the addition of metoprolol in the evening is being tolerated well.

## 2019-02-04 NOTE — Progress Notes (Signed)
Subjective:  Patient ID: Danielle Jordan, female    DOB: [birth year]  Age: 68 y.o. MRN: [id number]  CC:  Chief Complaint  Patient presents with  . Follow-up      HPI  Patient arrives today for follow-up of her chronic medical conditions  Hypertension: Patient is currently taking metoprolol, amlodipine, chlorthalidone, clonidine, hydralazine, spironolactone for her blood pressure.  She tells me she is only taking her metoprolol daily but is ordered to be taking this twice a day.  In addition she tells me she is been taking her hydralazine 3 times a day as opposed to twice a day which is ordered.  Status post emergency department visit: She tells me that 3 days ago she was seen in the emergency department at the hospital in [place].  Unfortunately we do not have access to medical records at this visit.  She tells me she experienced lightheadedness and tingling sensation in her left arm and was evaluated for stroke in the hospital.  She tells me that she underwent MRI and head CT and was told that she did not have a stroke.  It appears she was probably diagnosed with TIA, but she is not sure about this diagnosis.  She tells me she was also started on a low-dose aspirin and was given medication for cholesterol, but was not given a prescription to continue this medication.  Last lipid panel collected in this office was in October 2019 which showed normal cholesterol levels but she was not on any medication at that time as far she knows.  Chronic kidney disease: She has been noted to have chronic renal insufficiency stage IIIb.  I do not note whether her urine has been tested for protein.  She is not following with nephrology at this time.  Past Medical History:  Diagnosis Date  . Hyperlipidemia   . Hypertension   . Stroke ([place])   . Vitamin D deficiency disease 12/31/2018      Family History  Problem Relation Age of Onset  . Hypertension Mother   . Hypertension Father   . Arthritis  Father   . Stroke Paternal Aunt     Social History   Social History Narrative    Not working at this time. Has 3 boys and 1 girl. Divorced. Lives with daughter   Social History   Tobacco Use  . Smoking status: Never Smoker  . Smokeless tobacco: Never Used  Substance Use Topics  . Alcohol use: No     No outpatient medications have been marked as taking for the 02/04/19 encounter (Office Visit) with Ailene Ards, NP.    ROS:  Review of Systems  Constitutional: Negative for fever and malaise/fatigue.  Eyes: Negative for blurred vision.  Respiratory: Positive for cough (intermittently). Negative for shortness of breath and wheezing.   Cardiovascular: Negative for chest pain and palpitations.  Gastrointestinal: Positive for heartburn (only when eating late at night). Negative for abdominal pain.  Neurological: Negative for dizziness, sensory change, speech change, weakness and headaches.     Objective:   Today's Vitals: BP (!) 160/100   Pulse 79   Temp 98.8 F (37.1 C)   Resp 14   Ht 5\' 2"  (1.575 m)   Wt 196 lb 3.2 oz (89 kg)   SpO2 98%   BMI 35.89 kg/m  Vitals with BMI 02/04/2019 12/31/2018 01/19/2017  Height 5\' 2"  5\' 2"  -  Weight 196 lbs 3 oz 199 lbs -  BMI 35.88 36.39 -  Systolic 160 190 269  Diastolic 100 110 91  Pulse 79 72 72     Physical Exam Vitals reviewed.  Constitutional:      General: She is not in acute distress.    Appearance: Normal appearance.  HENT:     Head: Normocephalic and atraumatic.  Neck:     Vascular: No carotid bruit.  Cardiovascular:     Rate and Rhythm: Normal rate and regular rhythm.     Pulses: Normal pulses.     Heart sounds: Normal heart sounds.  Pulmonary:     Effort: Pulmonary effort is normal.     Breath sounds: Normal breath sounds.  Skin:    General: Skin is warm and dry.  Neurological:     General: No focal deficit present.     Mental Status: She is alert and oriented to person, place, and time.  Psychiatric:         Mood and Affect: Mood normal.        Behavior: Behavior normal.        Judgment: Judgment normal.          Assessment   No diagnosis found.    Tests ordered No orders of the defined types were placed in this encounter.    Plan: Please see assessment and plan per problem list below.   No orders of the defined types were placed in this encounter.   Patient to follow-up in 2 weeks for close monitoring of her uncontrolled blood pressure as well as to see if the addition of metoprolol in the evening is being tolerated.Marland Kitchen  Elenore Paddy, NP

## 2019-02-04 NOTE — Assessment & Plan Note (Signed)
I will collect lipid panel today.  Patient has had a stroke in the past in addition to probable TIA 3 days ago.  She most likely should be on statin therapy.  We will need to address this once her lipid panel comes back.

## 2019-02-04 NOTE — Assessment & Plan Note (Signed)
I will collect urine today for further evaluation of her chronic kidney disease.  We will also request metabolic panel today.  Need to consider referral to nephrology depending on these results.

## 2019-02-05 ENCOUNTER — Telehealth (INDEPENDENT_AMBULATORY_CARE_PROVIDER_SITE_OTHER): Payer: Self-pay | Admitting: Nurse Practitioner

## 2019-02-05 ENCOUNTER — Other Ambulatory Visit (INDEPENDENT_AMBULATORY_CARE_PROVIDER_SITE_OTHER): Payer: Self-pay | Admitting: Nurse Practitioner

## 2019-02-05 DIAGNOSIS — I10 Essential (primary) hypertension: Secondary | ICD-10-CM

## 2019-02-05 DIAGNOSIS — N1832 Chronic kidney disease, stage 3b: Secondary | ICD-10-CM

## 2019-02-05 LAB — COMPREHENSIVE METABOLIC PANEL
AG Ratio: 1.3 (calc) (ref 1.0–2.5)
ALT: 11 U/L (ref 6–29)
AST: 16 U/L (ref 10–35)
Albumin: 4.5 g/dL (ref 3.6–5.1)
Alkaline phosphatase (APISO): 69 U/L (ref 37–153)
BUN/Creatinine Ratio: 16 (calc) (ref 6–22)
BUN: 27 mg/dL — ABNORMAL HIGH (ref 7–25)
CO2: 26 mmol/L (ref 20–32)
Calcium: 10 mg/dL (ref 8.6–10.4)
Chloride: 100 mmol/L (ref 98–110)
Creat: 1.68 mg/dL — ABNORMAL HIGH (ref 0.50–0.99)
Globulin: 3.5 g/dL (calc) (ref 1.9–3.7)
Glucose, Bld: 109 mg/dL — ABNORMAL HIGH (ref 65–99)
Potassium: 4.3 mmol/L (ref 3.5–5.3)
Sodium: 136 mmol/L (ref 135–146)
Total Bilirubin: 0.6 mg/dL (ref 0.2–1.2)
Total Protein: 8 g/dL (ref 6.1–8.1)

## 2019-02-05 LAB — LIPID PANEL
Cholesterol: 147 mg/dL (ref <200)
HDL: 35 mg/dL — ABNORMAL LOW (ref >=50)
LDL Cholesterol (Calc): 90 mg/dL (calc)
Non-HDL Cholesterol (Calc): 112 mg/dL (calc) (ref <130)
Total CHOL/HDL Ratio: 4.2 (calc) (ref <5.0)
Triglycerides: 120 mg/dL (ref <150)

## 2019-02-05 LAB — URINALYSIS
Bilirubin Urine: NEGATIVE
Glucose, UA: NEGATIVE
Hgb urine dipstick: NEGATIVE
Ketones, ur: NEGATIVE
Nitrite: NEGATIVE
Specific Gravity, Urine: 1.006 (ref 1.001–1.03)
pH: 5.5 (ref 5.0–8.0)

## 2019-02-05 LAB — MICROALBUMIN / CREATININE URINE RATIO
Creatinine, Urine: 57 mg/dL (ref 20–275)
Microalb Creat Ratio: 170 mcg/mg creat — ABNORMAL HIGH (ref <30)
Microalb, Ur: 9.7 mg/dL

## 2019-02-05 NOTE — Telephone Encounter (Signed)
I attempted to call this patient today to discuss her lab results specifically my recommendation that she needs to be referred to nephrology and start on a statin. She did not answer the phone, but her daughter did. Unfortunately the patient has not signed the form that allows me to discuss this with her daughter. I asked the daughter to have the patient call me back. I also stated we could discuss this at the next patient's appointment in two weeks as well. The daughter tells me she understands.

## 2019-02-10 ENCOUNTER — Other Ambulatory Visit (INDEPENDENT_AMBULATORY_CARE_PROVIDER_SITE_OTHER): Payer: Self-pay | Admitting: Internal Medicine

## 2019-02-17 ENCOUNTER — Encounter (INDEPENDENT_AMBULATORY_CARE_PROVIDER_SITE_OTHER): Payer: Self-pay | Admitting: Internal Medicine

## 2019-02-17 ENCOUNTER — Other Ambulatory Visit: Payer: Self-pay

## 2019-02-17 ENCOUNTER — Ambulatory Visit (INDEPENDENT_AMBULATORY_CARE_PROVIDER_SITE_OTHER): Payer: Medicare Other | Admitting: Internal Medicine

## 2019-02-17 VITALS — BP 150/96 | HR 80 | Ht 62.0 in | Wt 196.0 lb

## 2019-02-17 DIAGNOSIS — I10 Essential (primary) hypertension: Secondary | ICD-10-CM

## 2019-02-17 DIAGNOSIS — N1832 Chronic kidney disease, stage 3b: Secondary | ICD-10-CM | POA: Diagnosis not present

## 2019-02-17 DIAGNOSIS — E785 Hyperlipidemia, unspecified: Secondary | ICD-10-CM | POA: Diagnosis not present

## 2019-02-17 MED ORDER — ATORVASTATIN CALCIUM 40 MG PO TABS: 40.0000 mg | ORAL_TABLET | Freq: Every day | ORAL | 3 refills | Status: DC

## 2019-02-17 NOTE — Progress Notes (Signed)
Metrics: Intervention Frequency ACO  Documented Smoking Status Yearly  Screened one or more times in 24 months  Cessation Counseling or  Active cessation medication Past 24 months  Past 24 months   Guideline developer: UpToDate (See UpToDate for funding source) Date Released: 2014       Wellness Office Visit  Subjective:  Patient ID: Danielle Jordan, female    DOB: 28-Nov-1950  Age: 69 y.o. MRN: [id number]  CC: This lady comes in for follow-up of uncontrolled hypertension in the face of recent stroke, hyperlipidemia also in the face of recent stroke. HPI  She is compliant with all medications except she had been asked to increase the metoprolol to 25 mg twice a day and she has not done so.  She continues with the rest of the medications as outlined below. She denies any new neurological symptoms. Past Medical History:  Diagnosis Date  . Hyperlipidemia   . Hypertension   . Stroke ([place])    2018  . Vitamin D deficiency disease 12/31/2018      Family History  Problem Relation Age of Onset  . Hypertension Mother   . Hypertension Father   . Arthritis Father   . Diabetes Father   . Stroke Paternal Aunt   . Bronchitis Brother        Every year    Social History   Social History Narrative    Not working at this time. Has 3 boys and 1 girl. Divorced. Lives with daughter   Social History   Tobacco Use  . Smoking status: Never Smoker  . Smokeless tobacco: Never Used  Substance Use Topics  . Alcohol use: No    Current Meds  Medication Sig  . amLODipine (NORVASC) 10 MG tablet Take 1 tablet (10 mg total) by mouth daily.  Marland Kitchen aspirin EC 81 MG tablet Take 81 mg by mouth daily.  . chlorthalidone (HYGROTON) 25 MG tablet Take 1 tablet (25 mg total) by mouth daily.  . cloNIDine (CATAPRES) 0.2 MG tablet Take 1 tablet by mouth twice daily  . hydrALAZINE (APRESOLINE) 100 MG tablet Take 1 tablet (100 mg total) by mouth 2 (two) times daily. (Patient taking differently: Take 100 mg by  mouth 3 (three) times daily. )  . metoprolol tartrate (LOPRESSOR) 25 MG tablet Take 1 tablet (25 mg total) by mouth 2 (two) times daily. (Patient taking differently: Take 25 mg by mouth daily. )  . spironolactone (ALDACTONE) 25 MG tablet Take 1 tablet (25 mg total) by mouth daily.      Objective:   Today's Vitals: BP (!) 150/96   Pulse 80   Ht 5\' 2"  (1.575 m)   Wt 196 lb (88.9 kg)   SpO2 98%   BMI 35.85 kg/m  Vitals with BMI 02/17/2019 02/04/2019 12/31/2018  Height 5\' 2"  5\' 2"  5\' 2"   Weight 196 lbs 196 lbs 3 oz 199 lbs  BMI 35.84 19.37 90.24  Systolic 097 353 299  Diastolic 96 242 683  Pulse 80 79 72     Physical Exam  She looks systemically well.  She is alert and orientated.  Her blood pressure is slightly improved compared to last time but still not controlled.     Assessment   1. Stage 3b chronic kidney disease   2. Essential hypertension   3. Hyperlipidemia, unspecified hyperlipidemia type       Tests ordered No orders of the defined types were placed in this encounter.    Plan: 1. I will start her  on statin with atorvastatin 40 mg daily. 2. I stressed the importance of increasing the metoprolol to 25 mg twice a day. 3. I will see her in about 2 to 3 weeks time to see how she is doing and we will do blood work again.   Meds ordered this encounter  Medications  . atorvastatin (LIPITOR) 40 MG tablet    Sig: Take 1 tablet (40 mg total) by mouth daily.    Dispense:  30 tablet    Refill:  3    [name], MD

## 2019-02-28 ENCOUNTER — Encounter: Payer: Self-pay | Admitting: Cardiovascular Disease

## 2019-02-28 ENCOUNTER — Other Ambulatory Visit: Payer: Self-pay

## 2019-02-28 ENCOUNTER — Ambulatory Visit (INDEPENDENT_AMBULATORY_CARE_PROVIDER_SITE_OTHER): Payer: Medicare Other | Admitting: Cardiovascular Disease

## 2019-02-28 VITALS — BP 153/104 | HR 85 | Ht 62.0 in | Wt 195.6 lb

## 2019-02-28 DIAGNOSIS — N183 Chronic kidney disease, stage 3 unspecified: Secondary | ICD-10-CM | POA: Diagnosis not present

## 2019-02-28 DIAGNOSIS — Z8673 Personal history of transient ischemic attack (TIA), and cerebral infarction without residual deficits: Secondary | ICD-10-CM

## 2019-02-28 DIAGNOSIS — E785 Hyperlipidemia, unspecified: Secondary | ICD-10-CM

## 2019-02-28 DIAGNOSIS — R079 Chest pain, unspecified: Secondary | ICD-10-CM

## 2019-02-28 DIAGNOSIS — I10 Essential (primary) hypertension: Secondary | ICD-10-CM | POA: Diagnosis not present

## 2019-02-28 NOTE — Progress Notes (Signed)
CARDIOLOGY CONSULT NOTE  Patient ID: Amamda Curbow MRN: [id number] DOB/AGE: [birth year] 67 y.o.  Admit date: (Not on file) Primary Physician: Wilson Singer, MD Referring Physician:   Reason for Consultation: Chest pain  HPI: Danielle Jordan is a 69 y.o. female who is being seen today for the evaluation of chest pain at the request of Wilson Singer, MD.   She has a history of relatively recent TIA and stroke in 2018.  She was evaluated by her PCP on 12/31/2018.  At that time she complained of chest pain.  Her blood pressure was 190/110 at that time.  She denies chest pain, palpitations, orthopnea, dizziness, and shortness of breath.  She has had some mild left ankle swelling since her stroke.  She has no complaints today.  I personally reviewed ECG performed today which demonstrates sinus rhythm with an isolated PVC.   Allergies  Allergen Reactions  . Lisinopril Cough    Current Outpatient Medications  Medication Sig Dispense Refill  . amLODipine (NORVASC) 10 MG tablet Take 1 tablet (10 mg total) by mouth daily. 30 tablet 1  . aspirin EC 81 MG tablet Take 81 mg by mouth daily.    Marland Kitchen atorvastatin (LIPITOR) 40 MG tablet Take 1 tablet (40 mg total) by mouth daily. 30 tablet 3  . chlorthalidone (HYGROTON) 25 MG tablet Take 1 tablet (25 mg total) by mouth daily. 30 tablet 1  . cloNIDine (CATAPRES) 0.2 MG tablet Take 1 tablet by mouth twice daily 60 tablet 0  . hydrALAZINE (APRESOLINE) 100 MG tablet TAKE 1 TABLET BY MOUTH THREE TIMES DAILY FOR BLOOD PRESSURE    . metoprolol tartrate (LOPRESSOR) 25 MG tablet Take 1 tablet (25 mg total) by mouth 2 (two) times daily. 60 tablet 3  . spironolactone (ALDACTONE) 25 MG tablet Take 1 tablet (25 mg total) by mouth daily. 30 tablet 1   No current facility-administered medications for this visit.    Past Medical History:  Diagnosis Date  . Hyperlipidemia   . Hypertension   . Stroke ([facility])    2018  . Vitamin D deficiency  disease 12/31/2018    Past Surgical History:  Procedure Laterality Date  . CESAREAN SECTION      Social History   Socioeconomic History  . Marital status: Divorced    Spouse name: Not on file  . Number of children: Not on file  . Years of education: Not on file  . Highest education level: Not on file  Occupational History  . Not on file  Tobacco Use  . Smoking status: Never Smoker  . Smokeless tobacco: Never Used  Substance and Sexual Activity  . Alcohol use: No  . Drug use: No  . Sexual activity: Not on file  Other Topics Concern  . Not on file  Social History Narrative    Not working at this time. Has 3 boys and 1 girl. Divorced. Lives with daughter   Social Determinants of Health   Financial Resource Strain:   . Difficulty of Paying Living Expenses: Not on file  Food Insecurity:   . Worried About Programme researcher, broadcasting/film/video in the Last Year: Not on file  . Ran Out of Food in the Last Year: Not on file  Transportation Needs:   . Lack of Transportation (Medical): Not on file  . Lack of Transportation (Non-Medical): Not on file  Physical Activity:   . Days of Exercise per Week: Not on file  . Minutes of  Exercise per Session: Not on file  Stress:   . Feeling of Stress : Not on file  Social Connections:   . Frequency of Communication with Friends and Family: Not on file  . Frequency of Social Gatherings with Friends and Family: Not on file  . Attends Religious Services: Not on file  . Active Member of Clubs or Organizations: Not on file  . Attends Banker Meetings: Not on file  . Marital Status: Not on file  Intimate Partner Violence:   . Fear of Current or Ex-Partner: Not on file  . Emotionally Abused: Not on file  . Physically Abused: Not on file  . Sexually Abused: Not on file     No family history of premature CAD in 1st degree relatives.  Current Meds  Medication Sig  . amLODipine (NORVASC) 10 MG tablet Take 1 tablet (10 mg total) by mouth  daily.  Marland Kitchen aspirin EC 81 MG tablet Take 81 mg by mouth daily.  Marland Kitchen atorvastatin (LIPITOR) 40 MG tablet Take 1 tablet (40 mg total) by mouth daily.  . chlorthalidone (HYGROTON) 25 MG tablet Take 1 tablet (25 mg total) by mouth daily.  . cloNIDine (CATAPRES) 0.2 MG tablet Take 1 tablet by mouth twice daily  . hydrALAZINE (APRESOLINE) 100 MG tablet TAKE 1 TABLET BY MOUTH THREE TIMES DAILY FOR BLOOD PRESSURE  . metoprolol tartrate (LOPRESSOR) 25 MG tablet Take 1 tablet (25 mg total) by mouth 2 (two) times daily.  Marland Kitchen spironolactone (ALDACTONE) 25 MG tablet Take 1 tablet (25 mg total) by mouth daily.      Review of systems complete and found to be negative unless listed above in HPI    Physical exam Blood pressure (!) 153/104, pulse 85, height 5\' 2"  (1.575 m), weight 195 lb 9.6 oz (88.7 kg), SpO2 96 %. General: NAD Neck: No JVD, no thyromegaly or thyroid nodule.  Lungs: Clear to auscultation bilaterally with normal respiratory effort. CV: Nondisplaced PMI. Regular rate and rhythm, normal S1/S2, no S3/S4, no murmur.  No peripheral edema.  No carotid bruit.    Abdomen: Soft, nontender, no distention.  Skin: Intact without lesions or rashes.  Neurologic: Alert and oriented x 3.  Psych: Normal affect. Extremities: No clubbing or cyanosis.  HEENT: Normal.   ECG: Most recent ECG reviewed.   Labs:    Lipids: Lab Results  Component Value Date/Time   LDLCALC 90 02/04/2019 02:37 PM   CHOL 147 02/04/2019 02:37 PM   TRIG 120 02/04/2019 02:37 PM   HDL 35 (L) 02/04/2019 02:37 PM      Echocardiogram 02/04/2019:  1. The left ventricle is normal in size with mildly increased wall  thickness.  2. The left ventricular systolic function is normal, LVEF is visually  estimated at > 55%.  3. There is grade I diastolic dysfunction (impaired relaxation).  4. The right ventricle is normal in size, with normal systolic function.    ASSESSMENT AND PLAN:   1.  Chest pain: Blood pressure was  190/110 at the time of the office visit when she complained of chest pain.  She has had no symptom recurrence.  LV systolic function is normal by echocardiogram and no regional wall motion abnormalities were described.  2.  Accelerated hypertension: Blood pressure is elevated.  This is currently being managed by her PCP.  One could consider switching metoprolol tartrate to carvedilol for more optimal blood pressure control.  3.  Chronic kidney disease stage III: Creatinine 1.68 on 02/04/2019.  4.  Hyperlipidemia: Lipids reviewed above.  Currently on atorvastatin.  5.  History of CVA: Currently on aspirin and statin.   Disposition: Follow up as needed  Signed: Kate Sable, M.D., F.A.C.C.  02/28/2019, 1:30 PM

## 2019-02-28 NOTE — Patient Instructions (Signed)
Your physician recommends that you schedule a follow-up appointment in: AS NEEDED WITH DR KONESWARAN  Your physician recommends that you continue on your current medications as directed. Please refer to the Current Medication list given to you today.  Thank you for choosing [facility]!!    

## 2019-03-01 ENCOUNTER — Other Ambulatory Visit (INDEPENDENT_AMBULATORY_CARE_PROVIDER_SITE_OTHER): Payer: Self-pay | Admitting: Internal Medicine

## 2019-03-10 ENCOUNTER — Other Ambulatory Visit (INDEPENDENT_AMBULATORY_CARE_PROVIDER_SITE_OTHER): Payer: Self-pay | Admitting: Internal Medicine

## 2019-03-12 ENCOUNTER — Other Ambulatory Visit: Payer: Self-pay

## 2019-03-12 ENCOUNTER — Encounter (INDEPENDENT_AMBULATORY_CARE_PROVIDER_SITE_OTHER): Payer: Self-pay | Admitting: Internal Medicine

## 2019-03-12 ENCOUNTER — Ambulatory Visit (INDEPENDENT_AMBULATORY_CARE_PROVIDER_SITE_OTHER): Payer: Medicare Other | Admitting: Internal Medicine

## 2019-03-12 VITALS — BP 138/76 | HR 103 | Temp 98.0°F | Resp 18 | Ht 62.0 in | Wt 193.0 lb

## 2019-03-12 DIAGNOSIS — E785 Hyperlipidemia, unspecified: Secondary | ICD-10-CM | POA: Diagnosis not present

## 2019-03-12 DIAGNOSIS — I10 Essential (primary) hypertension: Secondary | ICD-10-CM | POA: Diagnosis not present

## 2019-03-12 DIAGNOSIS — E559 Vitamin D deficiency, unspecified: Secondary | ICD-10-CM | POA: Diagnosis not present

## 2019-03-12 DIAGNOSIS — N1832 Chronic kidney disease, stage 3b: Secondary | ICD-10-CM | POA: Diagnosis not present

## 2019-03-12 NOTE — Progress Notes (Signed)
Metrics: Intervention Frequency ACO  Documented Smoking Status Yearly  Screened one or more times in 24 months  Cessation Counseling or  Active cessation medication Past 24 months  Past 24 months   Guideline developer: UpToDate (See UpToDate for funding source) Date Released: 2014       Wellness Office Visit  Subjective:  Patient ID: Danielle Jordan, female    DOB: [birth year]  Age: 69 y.o. MRN: [id number]  CC: This lady comes in for follow-up of hypertension, cerebrovascular disease, chronic kidney disease, hyperlipidemia and vitamin D deficiency. HPI  She says she is doing reasonably well.  She is compliant with all her medications now.  She was seen by cardiology and no further testing was done. She denies any chest pain, dyspnea, palpitations or any new limb weakness. She has been taking vitamin D3 supplementation for vitamin D deficiency as recommended. She says she is trying to eat better also. Past Medical History:  Diagnosis Date  . Hyperlipidemia   . Hypertension   . Stroke ([place])    2018  . Vitamin D deficiency disease 12/31/2018      Family History  Problem Relation Age of Onset  . Hypertension Mother   . Hypertension Father   . Arthritis Father   . Diabetes Father   . Stroke Paternal Aunt   . Bronchitis Brother        Every year    Social History   Social History Narrative    Not working at this time. Has 3 boys and 1 girl. Divorced. Lives with daughter   Social History   Tobacco Use  . Smoking status: Never Smoker  . Smokeless tobacco: Never Used  Substance Use Topics  . Alcohol use: No    Current Meds  Medication Sig  . amLODipine (NORVASC) 10 MG tablet Take 1 tablet (10 mg total) by mouth daily.  Marland Kitchen aspirin EC 81 MG tablet Take 81 mg by mouth daily.  Marland Kitchen atorvastatin (LIPITOR) 40 MG tablet Take 1 tablet (40 mg total) by mouth daily.  . chlorthalidone (HYGROTON) 25 MG tablet Take 1 tablet by mouth once daily  . Cholecalciferol (VITAMIN D-3) 125  MCG (5000 UT) TABS Take 1 tablet by mouth daily.  . cloNIDine (CATAPRES) 0.2 MG tablet Take 1 tablet by mouth twice daily  . hydrALAZINE (APRESOLINE) 100 MG tablet TAKE 1 TABLET BY MOUTH THREE TIMES DAILY FOR BLOOD PRESSURE  . metoprolol tartrate (LOPRESSOR) 25 MG tablet Take 1 tablet (25 mg total) by mouth 2 (two) times daily.  Marland Kitchen spironolactone (ALDACTONE) 25 MG tablet Take 1 tablet by mouth once daily       Objective:   Today's Vitals: BP 138/76 (BP Location: Right Arm, Patient Position: Sitting, Cuff Size: Normal)   Pulse (!) 103   Temp 98 F (36.7 C) (Temporal)   Resp 18   Ht 5\' 2"  (1.575 m)   Wt 193 lb (87.5 kg)   SpO2 98%   BMI 35.30 kg/m  Vitals with BMI 03/12/2019 02/28/2019 02/17/2019  Height 5\' 2"  5\' 2"  5\' 2"   Weight 193 lbs 195 lbs 10 oz 196 lbs  BMI 35.29 22.97 98.92  Systolic 119 417 408  Diastolic 76 144 96  Pulse 103 85 80     Physical Exam  She looks systemically well.  She has lost a couple of pounds.  Her blood pressure has significantly improved compared to last time.  She is alert and orientated with no focal neurological signs.     Assessment  1. Essential hypertension   2. Stage 3b chronic kidney disease   3. Hyperlipidemia, unspecified hyperlipidemia type   4. Vitamin D deficiency disease       Tests ordered Orders Placed This Encounter  Procedures  . COMPLETE METABOLIC PANEL WITH GFR  . VITAMIN D 25 Hydroxy (Vit-D Deficiency, Fractures)     Plan: 1. Blood work is ordered as above. 2. She will continue with all antihypertensive therapy as before and this seems to be controlling her blood pressure. 3. She will continue with statin therapy for hyperlipidemia in the face of cerebrovascular disease. 4. She will continue with vitamin D3 supplementation for vitamin D deficiency and we will check levels today. 5. Further recommendations will depend on blood results and I will have her follow-up with Sarah in 3 months time.   No orders of  the defined types were placed in this encounter.   Wilson Singer, MD

## 2019-03-13 ENCOUNTER — Encounter (INDEPENDENT_AMBULATORY_CARE_PROVIDER_SITE_OTHER): Payer: Self-pay | Admitting: Internal Medicine

## 2019-03-13 LAB — COMPLETE METABOLIC PANEL WITH GFR
AG Ratio: 1.3 (calc) (ref 1.0–2.5)
ALT: 12 U/L (ref 6–29)
AST: 15 U/L (ref 10–35)
Albumin: 4.3 g/dL (ref 3.6–5.1)
Alkaline phosphatase (APISO): 73 U/L (ref 37–153)
BUN/Creatinine Ratio: 16 (calc) (ref 6–22)
BUN: 25 mg/dL (ref 7–25)
CO2: 25 mmol/L (ref 20–32)
Calcium: 9.8 mg/dL (ref 8.6–10.4)
Chloride: 101 mmol/L (ref 98–110)
Creat: 1.58 mg/dL — ABNORMAL HIGH (ref 0.50–0.99)
GFR, Est African American: 39 mL/min/{1.73_m2} — ABNORMAL LOW (ref >=60)
GFR, Est Non African American: 34 mL/min/{1.73_m2} — ABNORMAL LOW (ref >=60)
Globulin: 3.2 g/dL (calc) (ref 1.9–3.7)
Glucose, Bld: 173 mg/dL — ABNORMAL HIGH (ref 65–99)
Potassium: 4 mmol/L (ref 3.5–5.3)
Sodium: 138 mmol/L (ref 135–146)
Total Bilirubin: 0.5 mg/dL (ref 0.2–1.2)
Total Protein: 7.5 g/dL (ref 6.1–8.1)

## 2019-03-13 LAB — VITAMIN D 25 HYDROXY (VIT D DEFICIENCY, FRACTURES): Vit D, 25-Hydroxy: 52 ng/mL (ref 30–100)

## 2019-03-26 ENCOUNTER — Other Ambulatory Visit (HOSPITAL_COMMUNITY): Payer: Self-pay | Admitting: Nephrology

## 2019-03-26 DIAGNOSIS — I5032 Chronic diastolic (congestive) heart failure: Secondary | ICD-10-CM

## 2019-03-26 DIAGNOSIS — I129 Hypertensive chronic kidney disease with stage 1 through stage 4 chronic kidney disease, or unspecified chronic kidney disease: Secondary | ICD-10-CM

## 2019-03-31 ENCOUNTER — Other Ambulatory Visit (INDEPENDENT_AMBULATORY_CARE_PROVIDER_SITE_OTHER): Payer: Self-pay | Admitting: Internal Medicine

## 2019-03-31 ENCOUNTER — Telehealth (INDEPENDENT_AMBULATORY_CARE_PROVIDER_SITE_OTHER): Payer: Self-pay

## 2019-03-31 DIAGNOSIS — I10 Essential (primary) hypertension: Secondary | ICD-10-CM

## 2019-03-31 MED ORDER — CHLORTHALIDONE 25 MG PO TABS: 25.0000 mg | ORAL_TABLET | Freq: Every day | ORAL | 1 refills | Status: DC

## 2019-03-31 NOTE — Telephone Encounter (Signed)
Requested Prescriptions  Maralyn Sago it looks like this medication was D/C'd please advise?  Name from pharmacy: Chlorthalidone 25 MG Oral Tablet       Will file in chart as: chlorthalidone (HYGROTON) 25 MG tablet   Sig: Take 1 tablet by mouth once daily   Disp:  90 tablet  Refills:  0   Start: 03/31/2019   Class: Normal   Non-formulary   Last ordered: 1 month ago by Wilson Singer, MD Last refill: 03/10/2019   Rx #: [id number]

## 2019-03-31 NOTE — Telephone Encounter (Signed)
I will refill this medication to [facility]. Please call her and schedule her for blood draw in 2 weeks so we can monitor the affects of the medication. Thank you!

## 2019-04-02 ENCOUNTER — Encounter (INDEPENDENT_AMBULATORY_CARE_PROVIDER_SITE_OTHER): Payer: Self-pay | Admitting: Nurse Practitioner

## 2019-04-02 ENCOUNTER — Other Ambulatory Visit (INDEPENDENT_AMBULATORY_CARE_PROVIDER_SITE_OTHER): Payer: Self-pay | Admitting: Internal Medicine

## 2019-04-02 NOTE — Progress Notes (Signed)
This encounter was created in error - please disregard.

## 2019-04-03 ENCOUNTER — Ambulatory Visit (HOSPITAL_COMMUNITY): Admission: RE | Admit: 2019-04-03 | Payer: Medicare Other | Source: Ambulatory Visit

## 2019-04-03 ENCOUNTER — Encounter (HOSPITAL_COMMUNITY): Payer: Self-pay

## 2019-04-08 ENCOUNTER — Other Ambulatory Visit (INDEPENDENT_AMBULATORY_CARE_PROVIDER_SITE_OTHER): Payer: Self-pay

## 2019-04-08 DIAGNOSIS — I10 Essential (primary) hypertension: Secondary | ICD-10-CM

## 2019-04-08 MED ORDER — CLONIDINE HCL 0.2 MG PO TABS: 0.2000 mg | ORAL_TABLET | Freq: Two times a day (BID) | ORAL | 0 refills | Status: DC

## 2019-04-08 MED ORDER — SPIRONOLACTONE 25 MG PO TABS: 25.0000 mg | ORAL_TABLET | Freq: Every day | ORAL | 1 refills | Status: DC

## 2019-04-14 ENCOUNTER — Other Ambulatory Visit (INDEPENDENT_AMBULATORY_CARE_PROVIDER_SITE_OTHER): Payer: Medicare Other

## 2019-04-30 IMAGING — MG DIGITAL DIAGNOSTIC UNILATERAL LEFT MAMMOGRAM
3 series · 3 of 3 positions shown · non-contrast
Comparison: Screening study, 12/31/2017.

CLINICAL DATA: Screening recall for left breast calcifications.

EXAM:
DIGITAL DIAGNOSTIC LEFT MAMMOGRAM

[L ML (1 of 2)]
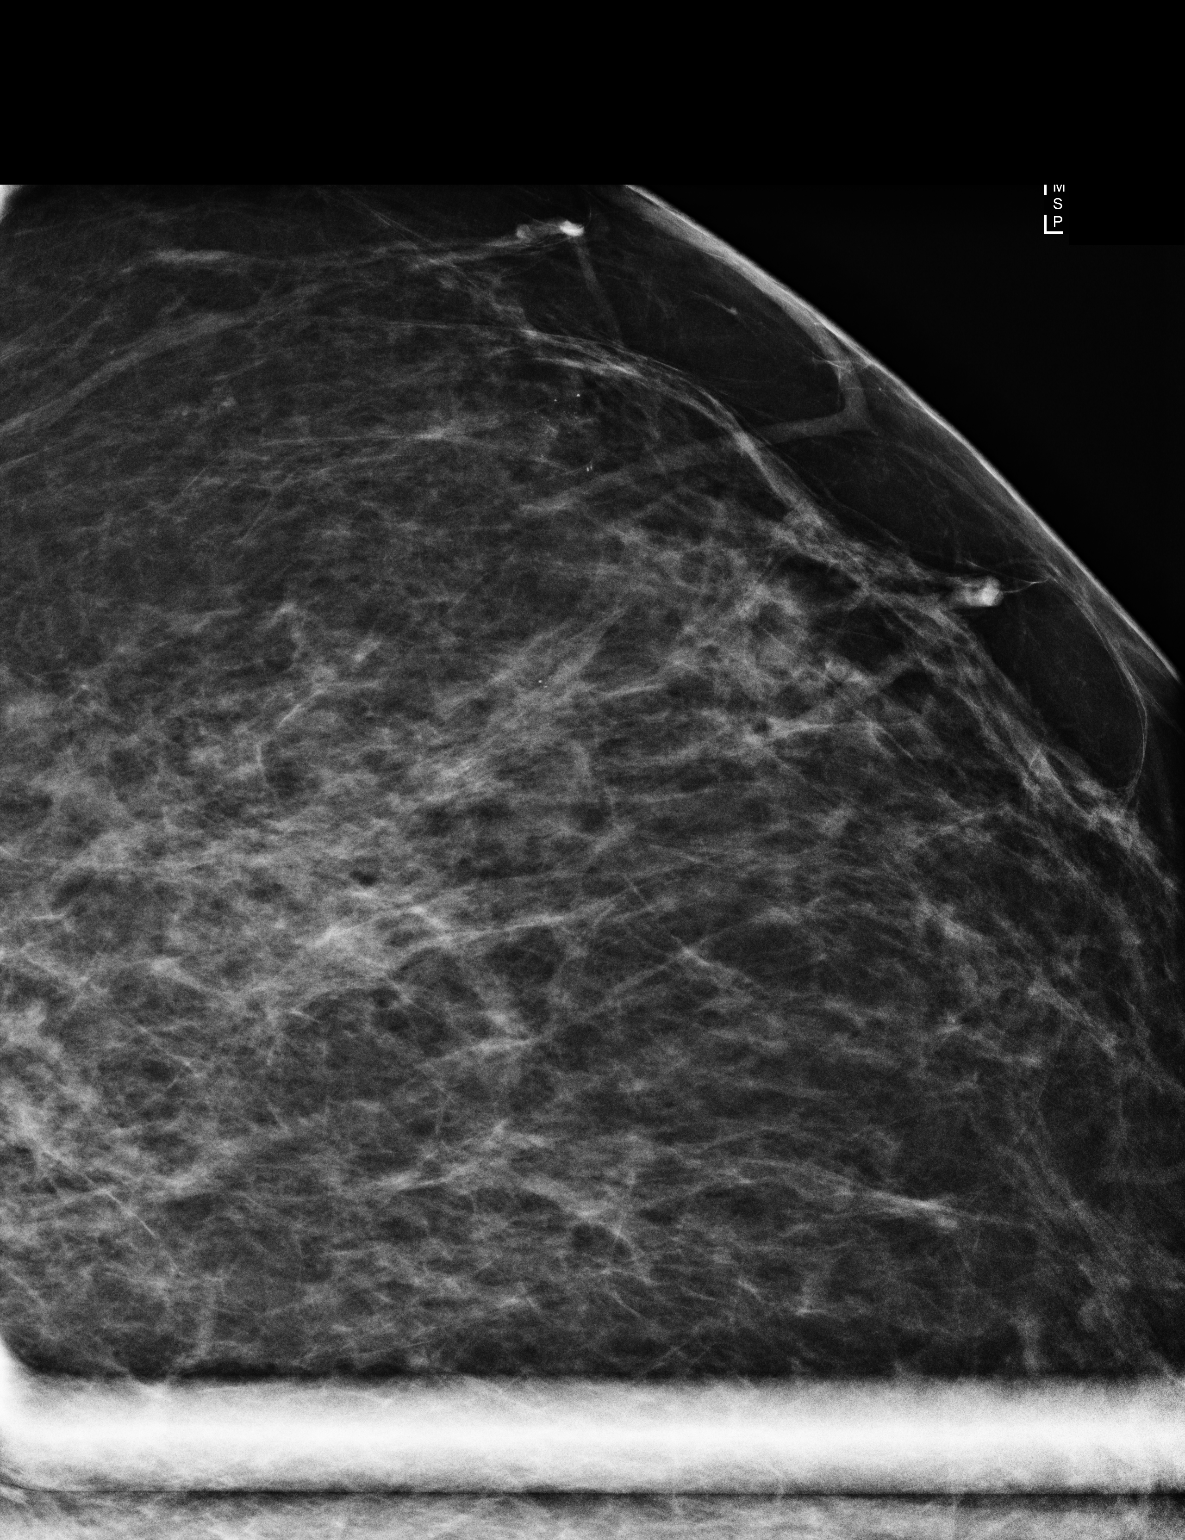

[L CC]
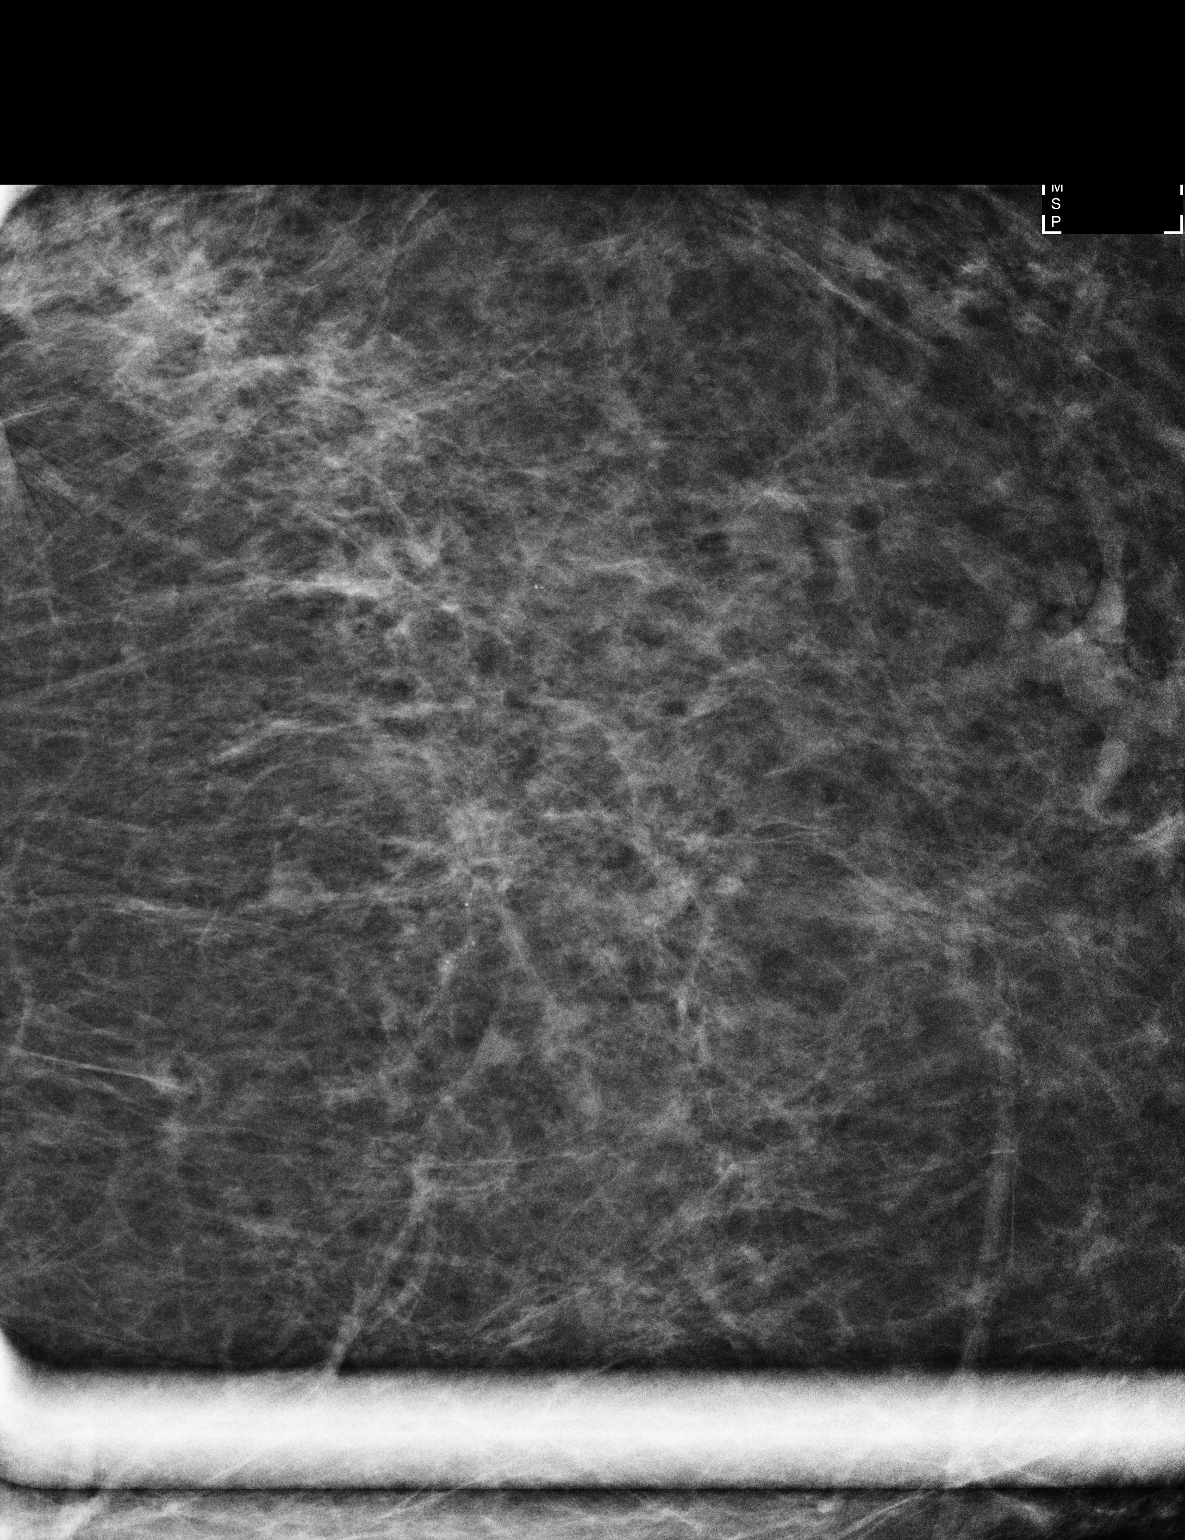

[L ML (2 of 2)]
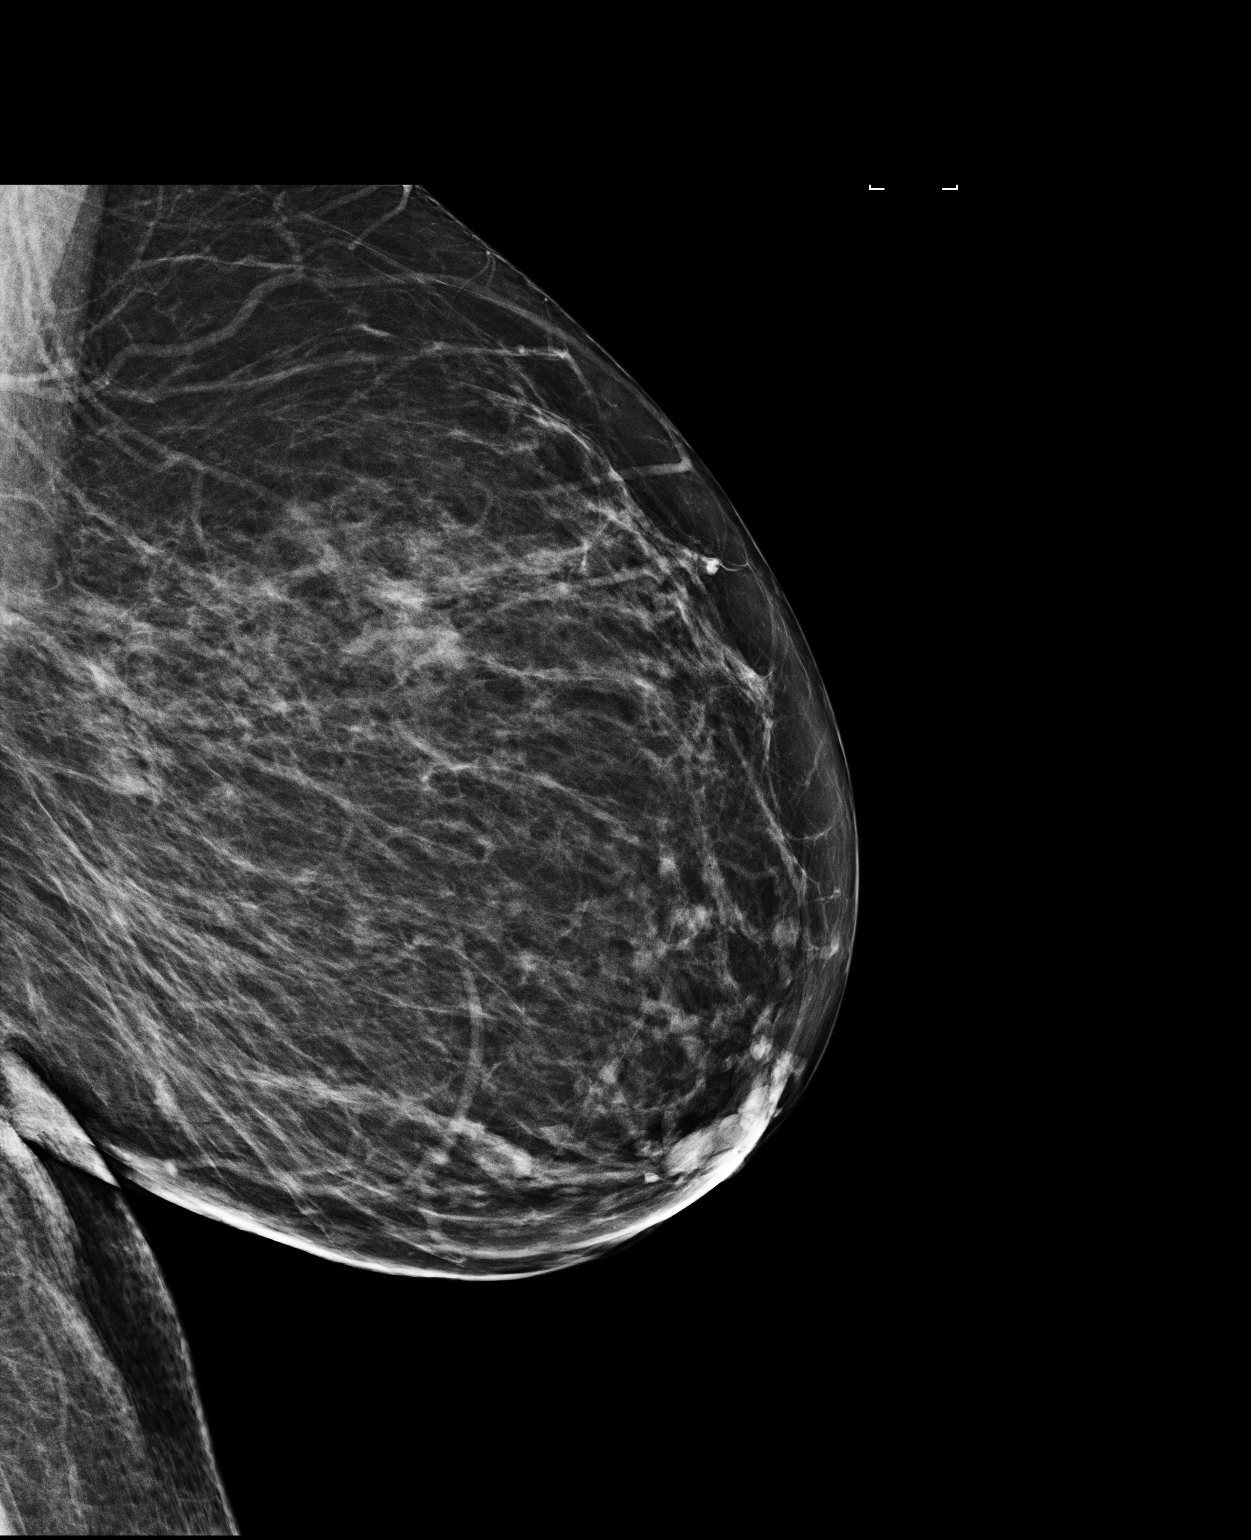

[3 of 3 positions shown; findings below may reference images not displayed]

There are no older exams
available.

ACR Breast Density Category b: There are scattered areas of
fibroglandular density.
FINDINGS: There is a small cluster of calcifications in the upper left breast
spanning 12 x 6 x 7 mm. Calcifications are punctate and small round
calcifications. There is no associated mass or convincing linearity.
There is no associated distortion.
IMPRESSION: Probably benign left breast calcifications. Short-term follow-up
recommended.

RECOMMENDATION:
Diagnostic left breast mammography with magnification views in 6
months.

I have discussed the findings and recommendations with the patient.
Results were also provided in writing at the conclusion of the
visit. If applicable, a reminder letter will be sent to the patient
regarding the next appointment.

BI-RADS CATEGORY  3: Probably benign.

## 2019-05-19 ENCOUNTER — Ambulatory Visit (HOSPITAL_COMMUNITY): Payer: Medicare Other | Attending: Nephrology

## 2019-05-19 ENCOUNTER — Encounter (HOSPITAL_COMMUNITY): Payer: Self-pay

## 2019-06-10 ENCOUNTER — Ambulatory Visit (INDEPENDENT_AMBULATORY_CARE_PROVIDER_SITE_OTHER): Payer: Medicare Other | Admitting: Nurse Practitioner

## 2019-06-17 ENCOUNTER — Ambulatory Visit (INDEPENDENT_AMBULATORY_CARE_PROVIDER_SITE_OTHER): Payer: Medicare Other | Admitting: Nurse Practitioner

## 2019-06-17 ENCOUNTER — Other Ambulatory Visit: Payer: Self-pay

## 2019-06-17 ENCOUNTER — Other Ambulatory Visit (INDEPENDENT_AMBULATORY_CARE_PROVIDER_SITE_OTHER): Payer: Self-pay | Admitting: Nurse Practitioner

## 2019-06-17 ENCOUNTER — Encounter (INDEPENDENT_AMBULATORY_CARE_PROVIDER_SITE_OTHER): Payer: Self-pay | Admitting: Nurse Practitioner

## 2019-06-17 ENCOUNTER — Encounter (INDEPENDENT_AMBULATORY_CARE_PROVIDER_SITE_OTHER): Payer: Self-pay

## 2019-06-17 VITALS — BP 152/100 | HR 90 | Temp 100.2°F | Ht 62.0 in | Wt 194.2 lb

## 2019-06-17 DIAGNOSIS — N1832 Chronic kidney disease, stage 3b: Secondary | ICD-10-CM

## 2019-06-17 DIAGNOSIS — R509 Fever, unspecified: Secondary | ICD-10-CM | POA: Diagnosis not present

## 2019-06-17 DIAGNOSIS — E559 Vitamin D deficiency, unspecified: Secondary | ICD-10-CM | POA: Diagnosis not present

## 2019-06-17 DIAGNOSIS — I10 Essential (primary) hypertension: Secondary | ICD-10-CM | POA: Diagnosis not present

## 2019-06-17 NOTE — Progress Notes (Addendum)
Subjective:  Patient ID: Danielle Jordan, female    DOB: 03/26/1950  Age: 69 y.o. MRN: [id number]  CC:  Chief Complaint  Patient presents with  . Hypertension  . Follow-up  . Other    Chronic kidney disease, hyperlipidemia, vitamin D deficiency      HPI  This patient comes in today for the above.  Hypertension: She has a history of hypertension.  She continues on amlodipine 10 mg daily, chlorthalidone 25 mg daily, clonidine 0.2 mg twice a day, hydralazine 100 mg 3 times a day, and spironolactone 25 mg daily.  She tells me that her blood pressures at home have been well controlled.  She has an automatic blood pressure cuff that is approximately 40-1/2 to 67 years old.  She tells me that her blood pressures are running anywhere between 128-130/77-98.  In the past she is to see that her blood pressure was greater than 106 systolically over greater than 100, and she tells me this has not been occurring lately.  Chronic kidney disease stage IIIb: She is established and followed by nephrology.  She saw Dr. Theador Hawthorne back in March of this year.  At that time it was noted that she was positive for microalbuminuria.  She was on valsartan at that time, she does bring her medications in today and I do not see that she is on that now.  She plans of following up with him as scheduled.  Hyperlipidemia: She continues on atorvastatin daily, and tells me she has not been taking her low-dose aspirin on a regular basis.  She has had a TIA in the past.  Last lipid panel showed LDL of 90 back in December 2020.  Vitamin D deficiency: She also has history of vitamin D deficiency and continues on vitamin D3 5000 IUs daily.  Last serum level was 52 this was collected back in January 2021.   Past Medical History:  Diagnosis Date  . Hyperlipidemia   . Hypertension   . Stroke ([place])    2018  . Vitamin D deficiency disease 12/31/2018      Family History  Problem Relation Age of Onset  . Hypertension  Mother   . Hypertension Father   . Arthritis Father   . Diabetes Father   . Stroke Paternal Aunt   . Bronchitis Brother        Every year    Social History   Social History Narrative    Not working at this time. Has 3 boys and 1 girl. Divorced. Lives with daughter   Social History   Tobacco Use  . Smoking status: Never Smoker  . Smokeless tobacco: Never Used  Substance Use Topics  . Alcohol use: No     Current Meds  Medication Sig  . AMLODIPINE BESYLATE PO Take 10 mg by mouth in the morning.  Marland Kitchen aspirin EC 81 MG tablet Take 81 mg by mouth daily.  Marland Kitchen atorvastatin (LIPITOR) 40 MG tablet Take 1 tablet (40 mg total) by mouth daily.  . chlorthalidone (HYGROTON) 25 MG tablet Take 1 tablet by mouth once daily  . Cholecalciferol (VITAMIN D-3) 125 MCG (5000 UT) TABS Take 1 tablet by mouth daily.  . cloNIDine (CATAPRES) 0.2 MG tablet Take 1 tablet (0.2 mg total) by mouth 2 (two) times daily.  . hydrALAZINE (APRESOLINE) 100 MG tablet Take 100 mg by mouth 3 (three) times daily.  Marland Kitchen spironolactone (ALDACTONE) 25 MG tablet Take 1 tablet (25 mg total) by mouth daily.  . [  DISCONTINUED] hydrALAZINE (APRESOLINE) 100 MG tablet TAKE 1 TABLET BY MOUTH THREE TIMES DAILY FOR BLOOD PRESSURE    ROS:  Review of Systems  Constitutional: Negative for fever, malaise/fatigue and weight loss.  Eyes: Negative for blurred vision and double vision.  Respiratory: Negative for cough, shortness of breath and wheezing.   Cardiovascular: Negative for chest pain and palpitations.  Gastrointestinal: Negative for abdominal pain and blood in stool.  Neurological: Negative for dizziness and headaches.  Endo/Heme/Allergies: Does not bruise/bleed easily.     Objective:   Today's Vitals: BP (!) 155/100 (BP Location: Right Arm, Patient Position: Sitting, Cuff Size: Normal)   Pulse 90   Temp 100.2 F (37.9 C) (Temporal)   Ht 5' 2"  (1.575 m)   Wt 194 lb 3.2 oz (88.1 kg)   SpO2 94%   BMI 35.52 kg/m  Vitals  with BMI 06/17/2019 03/12/2019 02/28/2019  Height 5' 2"  5' 2"  5' 2"   Weight 194 lbs 3 oz 193 lbs 195 lbs 10 oz  BMI 35.51 73.53 29.92  Systolic 426 834 196  Diastolic 222 76 979  Pulse 90 103 85     Physical Exam No new or acute findings noted today.  Blood pressure does remain above goal and continue to be elevated upon recheck.  Temperature was also slightly elevated.      Assessment and Plan   1. Essential hypertension   2. Fever, unspecified fever cause   3. Stage 3b chronic kidney disease   4. Vitamin D deficiency disease      Plan: 1., 3.  She tells me at home her blood pressure is running much better than it was in the office today.  I am can have her bring her at home logs to the office next week for my review.  I would like to restart her on her valsartan if blood pressure remains slightly above goal especially in light of her microalbuminuria.  I will reach out to her nephrologist to see if this medication was discontinued, or if maybe the patient has just not been taking it as it was not in her back medications that she brought with her the office today.  I will check metabolic panel again today for further evaluation as well.  2.  She tells me she did ambulate pretty far from the parking lot today, thus that may explain why her temperature is slightly elevated.  I will send her urine for evaluation just in case she may have a urinary tract infection without dysuria.  4.  I will collect serum level of vitamin D for further evaluation today.  Tests ordered Orders Placed This Encounter  Procedures  . Urinalysis with Culture Reflex  . CMP with eGFR([facility])  . Lipid Panel  . Vitamin D, 25-hydroxy      No orders of the defined types were placed in this encounter.   Patient to follow-up in approximately 3 months, unless blood pressure medications are changed based on her at home logs.  If this occurs we may need to see her sooner.  I did explain to this to her and she is  agreeable to that.  Ailene Ards, NP   Update 06/23/19 @ 1557: Patient brought in her at-home logs. Over the last month her BP ranges systolically from 892-119 and diastolically from  41-740. Average systolic reading over 59 readings is 129. Average diastolic reading over 59 readings is 86. Will not make any changes to her regimen at this time.

## 2019-06-17 NOTE — Patient Instructions (Signed)
PLEASE BRING YOUR AT-HOME BLOOD PRESSURE LOGS TO THE OFFICE ON OR AROUND 06/23/19 (NEXT Monday) FOR REVIEW.

## 2019-06-18 ENCOUNTER — Other Ambulatory Visit (INDEPENDENT_AMBULATORY_CARE_PROVIDER_SITE_OTHER): Payer: Self-pay | Admitting: Nurse Practitioner

## 2019-06-18 DIAGNOSIS — E785 Hyperlipidemia, unspecified: Secondary | ICD-10-CM

## 2019-06-18 LAB — EXTRA LAV TOP TUBE

## 2019-06-18 LAB — LIPID PANEL
Cholesterol: 171 mg/dL (ref <200)
HDL: 34 mg/dL — ABNORMAL LOW (ref >=50)
LDL Cholesterol (Calc): 111 mg/dL (calc) — ABNORMAL HIGH
Non-HDL Cholesterol (Calc): 137 mg/dL (calc) — ABNORMAL HIGH (ref <130)
Total CHOL/HDL Ratio: 5 (calc) — ABNORMAL HIGH (ref <5.0)
Triglycerides: 147 mg/dL (ref <150)

## 2019-06-18 LAB — COMPLETE METABOLIC PANEL WITH GFR
AG Ratio: 1.2 (calc) (ref 1.0–2.5)
ALT: 12 U/L (ref 6–29)
AST: 13 U/L (ref 10–35)
Albumin: 4.1 g/dL (ref 3.6–5.1)
Alkaline phosphatase (APISO): 72 U/L (ref 37–153)
BUN/Creatinine Ratio: 23 (calc) — ABNORMAL HIGH (ref 6–22)
BUN: 33 mg/dL — ABNORMAL HIGH (ref 7–25)
CO2: 22 mmol/L (ref 20–32)
Calcium: 9.3 mg/dL (ref 8.6–10.4)
Chloride: 101 mmol/L (ref 98–110)
Creat: 1.45 mg/dL — ABNORMAL HIGH (ref 0.50–0.99)
GFR, Est African American: 43 mL/min/{1.73_m2} — ABNORMAL LOW (ref >=60)
GFR, Est Non African American: 37 mL/min/{1.73_m2} — ABNORMAL LOW (ref >=60)
Globulin: 3.3 g/dL (calc) (ref 1.9–3.7)
Glucose, Bld: 152 mg/dL — ABNORMAL HIGH (ref 65–99)
Potassium: 3.8 mmol/L (ref 3.5–5.3)
Sodium: 137 mmol/L (ref 135–146)
Total Bilirubin: 0.5 mg/dL (ref 0.2–1.2)
Total Protein: 7.4 g/dL (ref 6.1–8.1)

## 2019-06-18 LAB — VITAMIN D 25 HYDROXY (VIT D DEFICIENCY, FRACTURES): Vit D, 25-Hydroxy: 46 ng/mL (ref 30–100)

## 2019-06-18 NOTE — Progress Notes (Signed)
Lab order

## 2019-06-19 LAB — CULTURE INDICATED

## 2019-06-19 LAB — URINALYSIS W MICROSCOPIC + REFLEX CULTURE
Bacteria, UA: NONE SEEN /HPF
Bilirubin Urine: NEGATIVE
Glucose, UA: NEGATIVE
Hgb urine dipstick: NEGATIVE
Hyaline Cast: NONE SEEN /LPF
Ketones, ur: NEGATIVE
Nitrites, Initial: NEGATIVE
Protein, ur: NEGATIVE
RBC / HPF: NONE SEEN /HPF (ref 0–2)
Specific Gravity, Urine: 1.009 (ref 1.001–1.03)
Squamous Epithelial / HPF: NONE SEEN /HPF (ref <=5)
pH: 5.5 (ref 5.0–8.0)

## 2019-06-19 LAB — URINE CULTURE: Result:: NO GROWTH

## 2019-06-19 LAB — HOUSE ACCOUNT TRACKING

## 2019-06-23 ENCOUNTER — Telehealth (INDEPENDENT_AMBULATORY_CARE_PROVIDER_SITE_OTHER): Payer: Self-pay | Admitting: Nurse Practitioner

## 2019-06-23 DIAGNOSIS — E785 Hyperlipidemia, unspecified: Secondary | ICD-10-CM

## 2019-06-23 DIAGNOSIS — I10 Essential (primary) hypertension: Secondary | ICD-10-CM

## 2019-06-23 MED ORDER — HYDRALAZINE HCL 100 MG PO TABS: 100.0000 mg | ORAL_TABLET | Freq: Three times a day (TID) | ORAL | 0 refills | Status: DC

## 2019-06-23 MED ORDER — ASPIRIN EC 81 MG PO TBEC: 81.0000 mg | DELAYED_RELEASE_TABLET | Freq: Every day | ORAL | 0 refills | Status: DC

## 2019-06-23 MED ORDER — AMLODIPINE BESYLATE 10 MG PO TABS: 10.0000 mg | ORAL_TABLET | Freq: Every morning | ORAL | 0 refills | Status: DC

## 2019-06-23 MED ORDER — CLONIDINE HCL 0.2 MG PO TABS: 0.2000 mg | ORAL_TABLET | Freq: Two times a day (BID) | ORAL | 0 refills | Status: DC

## 2019-06-23 MED ORDER — SPIRONOLACTONE 25 MG PO TABS: 25.0000 mg | ORAL_TABLET | Freq: Every day | ORAL | 0 refills | Status: DC

## 2019-06-23 MED ORDER — CHLORTHALIDONE 25 MG PO TABS: 25.0000 mg | ORAL_TABLET | Freq: Every day | ORAL | 0 refills | Status: DC

## 2019-06-23 MED ORDER — ATORVASTATIN CALCIUM 40 MG PO TABS: 40.0000 mg | ORAL_TABLET | Freq: Every day | ORAL | 3 refills | Status: DC

## 2019-06-23 NOTE — Telephone Encounter (Signed)
Please call patient let her know that I got her blood pressure readings.  Based on these readings from home I do not think she needs to make changes to her blood pressure medications at this time.  If she would like the readings back, let me know and we can give these to her otherwise I will shred them.  Thank you.

## 2019-06-23 NOTE — Addendum Note (Signed)
Addended by: Elenore Paddy on: 06/23/2019 04:24 PM   Modules accepted: Orders

## 2019-06-23 NOTE — Telephone Encounter (Signed)
Sabine daughter is aware you can shred the BP readings she does need a refill on all her medications.

## 2019-06-23 NOTE — Telephone Encounter (Signed)
Thank you.  I have sent the medication orders to her pharmacy electronically.  Thank you.

## 2019-06-24 NOTE — Telephone Encounter (Signed)
noted 

## 2019-07-16 ENCOUNTER — Other Ambulatory Visit (INDEPENDENT_AMBULATORY_CARE_PROVIDER_SITE_OTHER): Payer: Self-pay | Admitting: Internal Medicine

## 2019-07-16 DIAGNOSIS — I10 Essential (primary) hypertension: Secondary | ICD-10-CM

## 2019-07-21 ENCOUNTER — Other Ambulatory Visit (INDEPENDENT_AMBULATORY_CARE_PROVIDER_SITE_OTHER): Payer: Self-pay | Admitting: Internal Medicine

## 2019-07-21 DIAGNOSIS — I10 Essential (primary) hypertension: Secondary | ICD-10-CM

## 2019-08-23 ENCOUNTER — Other Ambulatory Visit (INDEPENDENT_AMBULATORY_CARE_PROVIDER_SITE_OTHER): Payer: Self-pay | Admitting: Internal Medicine

## 2019-08-23 DIAGNOSIS — I10 Essential (primary) hypertension: Secondary | ICD-10-CM

## 2019-09-11 ENCOUNTER — Other Ambulatory Visit (INDEPENDENT_AMBULATORY_CARE_PROVIDER_SITE_OTHER): Payer: Self-pay

## 2019-09-11 DIAGNOSIS — I10 Essential (primary) hypertension: Secondary | ICD-10-CM

## 2019-09-11 MED ORDER — HYDRALAZINE HCL 100 MG PO TABS: 100.0000 mg | ORAL_TABLET | Freq: Three times a day (TID) | ORAL | 0 refills | Status: DC

## 2019-09-23 ENCOUNTER — Encounter (INDEPENDENT_AMBULATORY_CARE_PROVIDER_SITE_OTHER): Payer: Self-pay | Admitting: Internal Medicine

## 2019-09-23 ENCOUNTER — Other Ambulatory Visit: Payer: Self-pay

## 2019-09-23 ENCOUNTER — Ambulatory Visit (INDEPENDENT_AMBULATORY_CARE_PROVIDER_SITE_OTHER): Payer: Medicare Other | Admitting: Internal Medicine

## 2019-09-23 VITALS — BP 130/100 | HR 83 | Temp 97.5°F | Ht 62.0 in | Wt 190.6 lb

## 2019-09-23 DIAGNOSIS — E785 Hyperlipidemia, unspecified: Secondary | ICD-10-CM

## 2019-09-23 DIAGNOSIS — I10 Essential (primary) hypertension: Secondary | ICD-10-CM | POA: Diagnosis not present

## 2019-09-23 DIAGNOSIS — N1832 Chronic kidney disease, stage 3b: Secondary | ICD-10-CM

## 2019-09-23 NOTE — Progress Notes (Signed)
Metrics: Intervention Frequency ACO  Documented Smoking Status Yearly  Screened one or more times in 24 months  Cessation Counseling or  Active cessation medication Past 24 months  Past 24 months   Guideline developer: UpToDate (See UpToDate for funding source) Date Released: 2014       Wellness Office Visit  Subjective:  Patient ID: Danielle Jordan, female    DOB: 1950-09-23  Age: 69 y.o. MRN: [id number]  CC: This lady comes in for follow-up of hypertension, hyperlipidemia and chronic kidney disease. HPI  She is seeing Dr. Wolfgang Phoenix, nephrology, for her chronic kidney disease. He has increased her clonidine to 3 times a day and this is helped her hypertension. She is compliant with all medications. She continues on Lipitor for her hyperlipidemia in the face of cerebrovascular disease. Past Medical History:  Diagnosis Date  . Hyperlipidemia   . Hypertension   . Stroke ([facility])    2018  . Vitamin D deficiency disease 12/31/2018   Past Surgical History:  Procedure Laterality Date  . CESAREAN SECTION       Family History  Problem Relation Age of Onset  . Hypertension Mother   . Hypertension Father   . Arthritis Father   . Diabetes Father   . Stroke Paternal Aunt   . Bronchitis Brother        Every year    Social History   Social History Narrative    Not working at this time. Has 3 boys and 1 girl. Divorced. Lives with daughter   Social History   Tobacco Use  . Smoking status: Never Smoker  . Smokeless tobacco: Never Used  Substance Use Topics  . Alcohol use: No    Current Meds  Medication Sig  . amLODipine (NORVASC) 10 MG tablet Take 1 tablet by mouth once daily  . aspirin EC 81 MG tablet Take 1 tablet (81 mg total) by mouth daily.  Marland Kitchen atorvastatin (LIPITOR) 40 MG tablet Take 1 tablet (40 mg total) by mouth daily.  . Cholecalciferol (VITAMIN D-3) 125 MCG (5000 UT) TABS Take 1 tablet by mouth daily.  . cloNIDine (CATAPRES) 0.2 MG tablet Take 1 tablet by mouth  twice daily  . hydrALAZINE (APRESOLINE) 100 MG tablet Take 1 tablet (100 mg total) by mouth 3 (three) times daily.  Marland Kitchen spironolactone (ALDACTONE) 25 MG tablet Take 1 tablet by mouth once daily       No flowsheet data found.   Objective:   Today's Vitals: BP (!) 130/100 (BP Location: Right Arm, Patient Position: Sitting, Cuff Size: Normal)   Pulse 83   Temp (!) 97.5 F (36.4 C) (Temporal)   Ht 5\' 2"  (1.575 m)   Wt 190 lb 9.6 oz (86.5 kg)   SpO2 94%   BMI 34.86 kg/m  Vitals with BMI 09/23/2019 06/17/2019 06/17/2019  Height 5\' 2"  - 5\' 2"   Weight 190 lbs 10 oz - 194 lbs 3 oz  BMI 34.85 - 35.51  Systolic 130 152 08/17/2019  Diastolic 100 100  Pulse 83 - 90     Physical Exam   She looks systemically well.  She remains obese but has lost 4 pounds since the last visit she was seen in the system.  Her blood pressure has also improved systolically.  She is alert and orientated without any focal neurological signs.    Assessment   1. Hypertension, unspecified type   2. Hyperlipidemia, unspecified hyperlipidemia type   3. Stage 3b chronic kidney disease  Tests ordered No orders of the defined types were placed in this encounter.    Plan: 1. She will continue with current therapy for her hypertension, all medications that are listed. 2. She will continue with statin therapy with Lipitor for hyperlipidemia. 3. She will follow with Dr. Wolfgang Phoenix for chronic kidney disease. 4. We discussed COVID-19 vaccination and I encouraged her to get vaccinated in view of her many high risk medical problems. 5. I will have her follow-up with Sarah in about 3 months time.  At that time she will need blood work   No orders of the defined types were placed in this encounter.   Wilson Singer, MD

## 2019-11-18 ENCOUNTER — Other Ambulatory Visit (INDEPENDENT_AMBULATORY_CARE_PROVIDER_SITE_OTHER): Payer: Self-pay | Admitting: Internal Medicine

## 2019-11-18 DIAGNOSIS — I10 Essential (primary) hypertension: Secondary | ICD-10-CM

## 2019-11-24 ENCOUNTER — Other Ambulatory Visit (HOSPITAL_COMMUNITY): Payer: Medicare Other

## 2020-01-05 ENCOUNTER — Ambulatory Visit (INDEPENDENT_AMBULATORY_CARE_PROVIDER_SITE_OTHER): Payer: Medicare Other | Admitting: Internal Medicine

## 2020-01-22 ENCOUNTER — Ambulatory Visit (INDEPENDENT_AMBULATORY_CARE_PROVIDER_SITE_OTHER): Payer: Medicare Other | Admitting: Internal Medicine

## 2020-02-20 ENCOUNTER — Other Ambulatory Visit (INDEPENDENT_AMBULATORY_CARE_PROVIDER_SITE_OTHER): Payer: Self-pay | Admitting: Internal Medicine

## 2020-02-20 ENCOUNTER — Ambulatory Visit (HOSPITAL_COMMUNITY)
Admission: RE | Admit: 2020-02-20 | Discharge: 2020-02-20 | Disposition: A | Discharge status: Home / Self Care | Payer: Medicare Other | Source: Ambulatory Visit | Attending: Internal Medicine | Admitting: Internal Medicine

## 2020-02-20 ENCOUNTER — Other Ambulatory Visit (HOSPITAL_COMMUNITY): Payer: Self-pay | Admitting: Nephrology

## 2020-02-20 ENCOUNTER — Other Ambulatory Visit: Payer: Self-pay

## 2020-02-20 DIAGNOSIS — I10 Essential (primary) hypertension: Secondary | ICD-10-CM

## 2020-02-20 DIAGNOSIS — R609 Edema, unspecified: Secondary | ICD-10-CM | POA: Insufficient documentation

## 2020-05-18 ENCOUNTER — Other Ambulatory Visit (INDEPENDENT_AMBULATORY_CARE_PROVIDER_SITE_OTHER): Payer: Self-pay | Admitting: Internal Medicine

## 2020-05-18 DIAGNOSIS — I10 Essential (primary) hypertension: Secondary | ICD-10-CM

## 2020-05-22 ENCOUNTER — Other Ambulatory Visit (INDEPENDENT_AMBULATORY_CARE_PROVIDER_SITE_OTHER): Payer: Self-pay | Admitting: Internal Medicine

## 2020-05-22 DIAGNOSIS — I10 Essential (primary) hypertension: Secondary | ICD-10-CM

## 2020-05-24 MED ORDER — SPIRONOLACTONE 25 MG PO TABS: 1.0000 | ORAL_TABLET | Freq: Every day | ORAL | 0 refills | Status: DC

## 2020-05-24 MED ORDER — CHLORTHALIDONE 25 MG PO TABS: 25.0000 mg | ORAL_TABLET | Freq: Every day | ORAL | 0 refills | Status: DC

## 2020-05-24 NOTE — Telephone Encounter (Signed)
Sent 30-day supply to [facility] in [place]

## 2020-05-24 NOTE — Telephone Encounter (Signed)
Called Dtr today. She said she will make sure she comes on the 5/5 appt to see you. Would you be able to refill medicines enough until  appt date?

## 2020-05-24 NOTE — Addendum Note (Signed)
Addended by: Jiles Prows E on: 05/24/2020 04:09 PM   Modules accepted: Orders

## 2020-06-17 ENCOUNTER — Encounter (INDEPENDENT_AMBULATORY_CARE_PROVIDER_SITE_OTHER): Payer: Self-pay | Admitting: Nurse Practitioner

## 2020-06-17 ENCOUNTER — Other Ambulatory Visit: Payer: Self-pay

## 2020-06-17 ENCOUNTER — Ambulatory Visit (INDEPENDENT_AMBULATORY_CARE_PROVIDER_SITE_OTHER): Payer: Medicare Other | Admitting: Nurse Practitioner

## 2020-06-17 VITALS — BP 156/102 | HR 84 | Temp 97.2°F | Ht 62.0 in | Wt 196.4 lb

## 2020-06-17 DIAGNOSIS — I10 Essential (primary) hypertension: Secondary | ICD-10-CM | POA: Diagnosis not present

## 2020-06-17 DIAGNOSIS — E559 Vitamin D deficiency, unspecified: Secondary | ICD-10-CM

## 2020-06-17 DIAGNOSIS — R739 Hyperglycemia, unspecified: Secondary | ICD-10-CM

## 2020-06-17 DIAGNOSIS — Z131 Encounter for screening for diabetes mellitus: Secondary | ICD-10-CM

## 2020-06-17 DIAGNOSIS — Z6835 Body mass index (BMI) 35.0-35.9, adult: Secondary | ICD-10-CM

## 2020-06-17 DIAGNOSIS — Z8673 Personal history of transient ischemic attack (TIA), and cerebral infarction without residual deficits: Secondary | ICD-10-CM

## 2020-06-17 DIAGNOSIS — E66812 Obesity, class 2: Secondary | ICD-10-CM

## 2020-06-17 DIAGNOSIS — E785 Hyperlipidemia, unspecified: Secondary | ICD-10-CM | POA: Diagnosis not present

## 2020-06-17 MED ORDER — CLONIDINE HCL 0.3 MG PO TABS: 0.3000 mg | ORAL_TABLET | Freq: Three times a day (TID) | ORAL | 2 refills | Status: DC

## 2020-06-17 MED ORDER — ATORVASTATIN CALCIUM 40 MG PO TABS: 40.0000 mg | ORAL_TABLET | Freq: Every day | ORAL | 3 refills | Status: DC

## 2020-06-17 NOTE — Progress Notes (Signed)
Subjective:  Patient ID: Danielle Jordan, female    DOB: [birth year]  Age: 70 y.o. MRN: [id number]  CC:  Chief Complaint  Patient presents with  . Follow-up    Needs refills, doing well, no concerns  . Hypertension  . Other    Intermittent left-sided chest tightness, vitamin D deficiency  . Hyperlipidemia  . Chronic Kidney Disease      HPI  This patient arrives today for the above.  Hypertension: She continues on amlodipine 10 mg daily, chlorthalidone 25 mg daily, clonidine 0.2 mg 3 times a day, hydralazine 100 mg 3 times a day, and spironolactone 25 mg daily.  Blood pressure is still difficult for her to control.  She does follow with nephrology for evaluation of her chronic kidney disease.  She does have a history of a CVA and per previous medical records was supposed to undergo evaluation/investigation for secondary hypertension, however its not clear if this was ever completed.  Intermittent left-sided chest tightness: She mentioned she is been having these episodes of left-sided chest tightness that will come on spontaneously and seem to resolve spontaneously as well.  The tightness will radiate up into her neck and back and down her left arm.  She denies any shortness of breath or chest pain during the episodes.  She tells me she is having episode today.  She does note that her blood pressure seems to be elevated during these episodes.  She denies any headache or blurry vision or cardiac palpitations.   Vitamin D deficiency: She continues on 5000 IUs daily.  Last serum check showed a serum level of 46.  This was approximate 1 year ago.  Hyperlipidemia: Last LDL was collected about a year ago and it was 111.  She is supposed to be on atorvastatin 40 mg daily, but she is not sure if she takes this medication.  She also mention she is not been taking prior daily low-dose aspirin.  She denies any melena, bright red blood per rectum, or abdominal pain.  Chronic kidney disease: She  does follow with Dr. Theador Hawthorne.  It is believed that her chronic kidney disease is related to her longstanding hypertension.  She is positive proteinuria.  Per Dr. Toya Smothers note, he stated that patient was on valsartan.  She tells me today she does not believe she is taking the medication.  Past Medical History:  Diagnosis Date  . Hyperlipidemia   . Hypertension   . Stroke ([place])    2018  . Vitamin D deficiency disease 12/31/2018      Family History  Problem Relation Age of Onset  . Hypertension Mother   . Hypertension Father   . Arthritis Father   . Diabetes Father   . Stroke Paternal Aunt   . Bronchitis Brother        Every year    Social History   Social History Narrative    Not working at this time. Has 3 boys and 1 girl. Divorced. Lives with daughter   Social History   Tobacco Use  . Smoking status: Never Smoker  . Smokeless tobacco: Never Used  Substance Use Topics  . Alcohol use: No     Current Meds  Medication Sig  . acetaminophen (TYLENOL) 325 MG tablet Take 650 mg by mouth as needed.  Marland Kitchen amLODipine (NORVASC) 10 MG tablet Take 1 tablet by mouth once daily  . chlorthalidone (HYGROTON) 25 MG tablet Take 1 tablet (25 mg total) by mouth daily.  . Cholecalciferol (  VITAMIN D-3) 125 MCG (5000 UT) TABS Take 1 tablet by mouth daily.  . cloNIDine (CATAPRES) 0.3 MG tablet Take 1 tablet (0.3 mg total) by mouth 3 (three) times daily.  . hydrALAZINE (APRESOLINE) 100 MG tablet Take 1 tablet (100 mg total) by mouth 3 (three) times daily.  Marland Kitchen spironolactone (ALDACTONE) 25 MG tablet Take 1 tablet (25 mg total) by mouth daily.  . [DISCONTINUED] cloNIDine (CATAPRES) 0.1 MG tablet Take 0.2 mg by mouth 3 (three) times daily.    ROS:  Review of Systems  Constitutional: Negative for diaphoresis.  Eyes: Negative for blurred vision.  Respiratory: Negative for shortness of breath.   Cardiovascular: Negative for chest pain and palpitations.  Neurological: Negative for dizziness and  headaches.     Objective:   Today's Vitals: BP (!) 156/102   Pulse 84   Temp (!) 97.2 F (36.2 C) (Temporal)   Ht _0  (1.575 m)   Wt 196 lb 6.4 oz (89.1 kg)   SpO2 98%   BMI 35.92 kg/m  Vitals with BMI 06/17/2020 09/23/2019 06/17/2019  Height _1  _2  -  Weight 196 lbs 6 oz 190 lbs 10 oz -  BMI 26.33 35.45 -  Systolic 625 638 937  Diastolic 342 876 811  Pulse 84 83 -     Physical Exam Vitals reviewed.  Constitutional:      General: She is not in acute distress.    Appearance: Normal appearance.  HENT:     Head: Normocephalic and atraumatic.  Neck:     Vascular: No carotid bruit.  Cardiovascular:     Rate and Rhythm: Normal rate and regular rhythm.     Pulses: Normal pulses.     Heart sounds: Normal heart sounds.  Pulmonary:     Effort: Pulmonary effort is normal.     Breath sounds: Normal breath sounds.  Skin:    General: Skin is warm and dry.  Neurological:     General: No focal deficit present.     Mental Status: She is alert and oriented to person, place, and time.  Psychiatric:        Mood and Affect: Mood normal.        Behavior: Behavior normal.        Judgment: Judgment normal.       EKG: NSR with signs of LVH   Assessment and Plan   1. Hypertension, unspecified type   2. Hyperlipidemia, unspecified hyperlipidemia type   3. Vitamin D deficiency disease   4. Screening for diabetes mellitus   5. Class 2 severe obesity with serious comorbidity and body mass index (BMI) of 35.0 to 35.9 in adult, unspecified obesity type ([place])   6. History of CVA (cerebrovascular accident)   7. Hyperglycemia      Plan: 1.  Blood pressure quite a bit above goal today.  I did consult with Dr. Anastasio Champion regarding neck steps with her blood pressure medications.  Per shared decision making with Dr. Anastasio Champion and the patient we will increase her clonidine to 0.3 mg 3 times a day.  We will have her follow-up in 1 month, I want to have her come in sooner however we did not  have any earlier available openings.  She was told that if she feels unwell in any way that she should return back to her 0.2 mg 3 times a day of clonidine and let her office know.  She tells me she understands.  We will check some blood work today  as well for further evaluation.  I will also refer her to cardiology for further evaluation of these chest tightness episodes.  I will also reach out to radiology regarding possibly having patient undergo evaluation for renal artery stenosis.  Further recommendations be made based upon blood work results and conversation with radiologist. 2.  She will continue on her atorvastatin, I told her about risk and benefits with aspirin therapy at her age and considering her past medical history of CVA.  She will think about this and determine whether or not she wants to restart the low-dose aspirin. 3.  She will continue on her vitamin D3 supplement we will check serum level today. 4.,  5.,  7.  We will check A1c today. 6.  I will refer her to neurology.   Tests ordered Orders Placed This Encounter  Procedures  . Lipid Panel  . CMP with eGFR([facility])  . Vitamin D, 25-hydroxy  . Hemoglobin A1c  . Ambulatory referral to Cardiology  . Ambulatory referral to Neurology  . EKG 12-Lead      Meds ordered this encounter  Medications  . cloNIDine (CATAPRES) 0.3 MG tablet    Sig: Take 1 tablet (0.3 mg total) by mouth 3 (three) times daily.    Dispense:  90 tablet    Refill:  2    Order Specific Question:   Supervising Provider    Answer:   Hurshel Party C [[id number]]  . atorvastatin (LIPITOR) 40 MG tablet    Sig: Take 1 tablet (40 mg total) by mouth daily.    Dispense:  30 tablet    Refill:  3    Order Specific Question:   Supervising Provider    Answer:   Doree Albee [[id number]]    Patient to follow-up in 1 month, or sooner as needed  Danielle Ards, NP

## 2020-06-18 ENCOUNTER — Other Ambulatory Visit (INDEPENDENT_AMBULATORY_CARE_PROVIDER_SITE_OTHER): Payer: Self-pay | Admitting: Nurse Practitioner

## 2020-06-18 DIAGNOSIS — I10 Essential (primary) hypertension: Secondary | ICD-10-CM

## 2020-06-18 LAB — HEMOGLOBIN A1C
Hgb A1c MFr Bld: 8.4 % of total Hgb — ABNORMAL HIGH (ref <5.7)
Mean Plasma Glucose: 194 mg/dL
eAG (mmol/L): 10.8 mmol/L

## 2020-06-18 LAB — COMPLETE METABOLIC PANEL WITH GFR
AG Ratio: 1.2 (calc) (ref 1.0–2.5)
ALT: 13 U/L (ref 6–29)
AST: 13 U/L (ref 10–35)
Albumin: 4.4 g/dL (ref 3.6–5.1)
Alkaline phosphatase (APISO): 72 U/L (ref 37–153)
BUN/Creatinine Ratio: 23 (calc) — ABNORMAL HIGH (ref 6–22)
BUN: 32 mg/dL — ABNORMAL HIGH (ref 7–25)
CO2: 27 mmol/L (ref 20–32)
Calcium: 10.2 mg/dL (ref 8.6–10.4)
Chloride: 101 mmol/L (ref 98–110)
Creat: 1.39 mg/dL — ABNORMAL HIGH (ref 0.50–0.99)
GFR, Est African American: 45 mL/min/{1.73_m2} — ABNORMAL LOW (ref >=60)
GFR, Est Non African American: 39 mL/min/{1.73_m2} — ABNORMAL LOW (ref >=60)
Globulin: 3.6 g/dL (calc) (ref 1.9–3.7)
Glucose, Bld: 156 mg/dL — ABNORMAL HIGH (ref 65–139)
Potassium: 4 mmol/L (ref 3.5–5.3)
Sodium: 136 mmol/L (ref 135–146)
Total Bilirubin: 0.6 mg/dL (ref 0.2–1.2)
Total Protein: 8 g/dL (ref 6.1–8.1)

## 2020-06-18 LAB — LIPID PANEL
Cholesterol: 175 mg/dL (ref <200)
HDL: 33 mg/dL — ABNORMAL LOW (ref >=50)
LDL Cholesterol (Calc): 114 mg/dL (calc) — ABNORMAL HIGH
Non-HDL Cholesterol (Calc): 142 mg/dL (calc) — ABNORMAL HIGH (ref <130)
Total CHOL/HDL Ratio: 5.3 (calc) — ABNORMAL HIGH (ref <5.0)
Triglycerides: 167 mg/dL — ABNORMAL HIGH (ref <150)

## 2020-06-18 LAB — VITAMIN D 25 HYDROXY (VIT D DEFICIENCY, FRACTURES): Vit D, 25-Hydroxy: 73 ng/mL (ref 30–100)

## 2020-06-18 NOTE — Progress Notes (Signed)
Referral to hypertension clinic for assistance with treating/evaluating difficult to control hypertension.

## 2020-06-21 ENCOUNTER — Telehealth (INDEPENDENT_AMBULATORY_CARE_PROVIDER_SITE_OTHER): Payer: Self-pay | Admitting: Nurse Practitioner

## 2020-06-21 NOTE — Telephone Encounter (Signed)
Please call this patient and see how she is tolerating the change to her blood pressure medication. We increased her clonidine to 0.3mg  TID. If you could just verify that she is feeling well overall with the medication change. Also please notify her that I have referred her to the hypertension clinic for assistance with treating/evaluating her blood pressure. If she feels unwell on the new medication dosage, I would recommend that she return to taking her 0.2mg  TID and follow-up with the hypertension clinic once they call her to set up an appointment. Thank you.

## 2020-06-22 NOTE — Telephone Encounter (Signed)
Called patient and LMOVM to return call  Left a detailed voice message for patient to call back to go over message. 

## 2020-06-24 ENCOUNTER — Ambulatory Visit (INDEPENDENT_AMBULATORY_CARE_PROVIDER_SITE_OTHER): Payer: Medicare Other | Admitting: Cardiology

## 2020-06-24 ENCOUNTER — Encounter: Payer: Self-pay | Admitting: *Deleted

## 2020-06-24 ENCOUNTER — Encounter: Payer: Self-pay | Admitting: Cardiology

## 2020-06-24 VITALS — BP 160/100 | HR 80 | Ht 62.0 in | Wt 197.4 lb

## 2020-06-24 DIAGNOSIS — G4730 Sleep apnea, unspecified: Secondary | ICD-10-CM | POA: Diagnosis not present

## 2020-06-24 DIAGNOSIS — N1832 Chronic kidney disease, stage 3b: Secondary | ICD-10-CM

## 2020-06-24 DIAGNOSIS — R079 Chest pain, unspecified: Secondary | ICD-10-CM

## 2020-06-24 DIAGNOSIS — I10 Essential (primary) hypertension: Secondary | ICD-10-CM | POA: Diagnosis not present

## 2020-06-24 DIAGNOSIS — E782 Mixed hyperlipidemia: Secondary | ICD-10-CM

## 2020-06-24 MED ORDER — ATORVASTATIN CALCIUM 80 MG PO TABS: 80.0000 mg | ORAL_TABLET | Freq: Every day | ORAL | 1 refills | Status: DC

## 2020-06-24 MED ORDER — LABETALOL HCL 200 MG PO TABS: 200.0000 mg | ORAL_TABLET | Freq: Two times a day (BID) | ORAL | 1 refills | Status: DC

## 2020-06-24 NOTE — Patient Instructions (Signed)
Your physician recommends that you schedule a follow-up appointment in: 6 WEEKS WITH DR BRANCH OR EXTENDER   Your physician has recommended you make the following change in your medication:   START LABETALOL 200 MG TWICE DAILY   INCREASE ATORVASTATIN 80 MG DAILY   Your physician has requested that you have en exercise stress myoview. For further information please visit [email or web address] Please follow instruction sheet, as given.  Your physician has requested that you have a renal artery duplex. During this test, an ultrasound is used to evaluate blood flow to the kidneys. Allow one hour for this exam. Do not eat after midnight the day before and avoid carbonated beverages. Take your medications as you usually do.  You have been referred to PULMONARY    [email or web address]">  DASH Eating Plan DASH stands for Dietary Approaches to Stop Hypertension. The DASH eating plan is a healthy eating plan that has been shown to:  Reduce high blood pressure (hypertension).  Reduce your risk for type 2 diabetes, heart disease, and stroke.  Help with weight loss. What are tips for following this plan? Reading food labels  Check food labels for the amount of salt (sodium) per serving. Choose foods with less than 5 percent of the Daily Value of sodium. Generally, foods with less than 300 milligrams (mg) of sodium per serving fit into this eating plan.  To find whole grains, look for the word "whole" as the first word in the ingredient list. Shopping  Buy products labeled as "low-sodium" or "no salt added."  Buy fresh foods. Avoid canned foods and pre-made or frozen meals. Cooking  Avoid adding salt when cooking. Use salt-free seasonings or herbs instead of table salt or sea salt. Check with your health care provider or pharmacist before using salt substitutes.  Do not fry foods. Cook foods using healthy methods such as baking, boiling, grilling,  roasting, and broiling instead.  Cook with heart-healthy oils, such as olive, canola, avocado, soybean, or sunflower oil. Meal planning  Eat a balanced diet that includes: ? 4 or more servings of fruits and 4 or more servings of vegetables each day. Try to fill one-half of your plate with fruits and vegetables. ? 6-8 servings of whole grains each day. ? Less than 6 oz (170 g) of lean meat, poultry, or fish each day. A 3-oz (85-g) serving of meat is about the same size as a deck of cards. One egg equals 1 oz (28 g). ? 2-3 servings of low-fat dairy each day. One serving is 1 cup (237 mL). ? 1 serving of nuts, seeds, or beans 5 times each week. ? 2-3 servings of heart-healthy fats. Healthy fats called omega-3 fatty acids are found in foods such as walnuts, flaxseeds, fortified milks, and eggs. These fats are also found in cold-water fish, such as sardines, salmon, and mackerel.  Limit how much you eat of: ? Canned or prepackaged foods. ? Food that is high in trans fat, such as some fried foods. ? Food that is high in saturated fat, such as fatty meat. ? Desserts and other sweets, sugary drinks, and other foods with added sugar. ? Full-fat dairy products.  Do not salt foods before eating.  Do not eat more than 4 egg yolks a week.  Try to eat at least 2 vegetarian meals a week.  Eat more home-cooked food and less restaurant, buffet, and fast food.   Lifestyle  When eating at a restaurant, ask that your food be prepared with less salt  or no salt, if possible.  If you drink alcohol: ? Limit how much you use to:  0-1 drink a day for women who are not pregnant.  0-2 drinks a day for men. ? Be aware of how much alcohol is in your drink. In the [place] one drink equals one 12 oz bottle of beer (355 mL), one 5 oz glass of wine (148 mL), or one 1 oz glass of hard liquor (44 mL). General information  Avoid eating more than 2,300 mg of salt a day. If you have hypertension, you may need to  reduce your sodium intake to 1,500 mg a day.  Work with your health care provider to maintain a healthy body weight or to lose weight. Ask what an ideal weight is for you.  Get at least 30 minutes of exercise that causes your heart to beat faster (aerobic exercise) most days of the week. Activities may include walking, swimming, or biking.  Work with your health care provider or dietitian to adjust your eating plan to your individual calorie needs. What foods should I eat? Fruits All fresh, dried, or frozen fruit. Canned fruit in natural juice (without added sugar). Vegetables Fresh or frozen vegetables (raw, steamed, roasted, or grilled). Low-sodium or reduced-sodium tomato and vegetable juice. Low-sodium or reduced-sodium tomato sauce and tomato paste. Low-sodium or reduced-sodium canned vegetables. Grains Whole-grain or whole-wheat bread. Whole-grain or whole-wheat pasta. Brown rice. Orpah Cobb. Bulgur. Whole-grain and low-sodium cereals. Pita bread. Low-fat, low-sodium crackers. Whole-wheat flour tortillas. Meats and other proteins Skinless chicken or [place]. Ground chicken or [place]. Pork with fat trimmed off. Fish and seafood. Egg whites. Dried beans, peas, or lentils. Unsalted nuts, nut butters, and seeds. Unsalted canned beans. Lean cuts of beef with fat trimmed off. Low-sodium, lean precooked or cured meat, such as sausages or meat loaves. Dairy Low-fat (1%) or fat-free (skim) milk. Reduced-fat, low-fat, or fat-free cheeses. Nonfat, low-sodium ricotta or cottage cheese. Low-fat or nonfat yogurt. Low-fat, low-sodium cheese. Fats and oils Soft margarine without trans fats. Vegetable oil. Reduced-fat, low-fat, or light mayonnaise and salad dressings (reduced-sodium). Canola, safflower, olive, avocado, soybean, and sunflower oils. Avocado. Seasonings and condiments Herbs. Spices. Seasoning mixes without salt. Other foods Unsalted popcorn and pretzels. Fat-free sweets. The items  listed above may not be a complete list of foods and beverages you can eat. Contact a dietitian for more information. What foods should I avoid? Fruits Canned fruit in a light or heavy syrup. Fried fruit. Fruit in cream or butter sauce. Vegetables Creamed or fried vegetables. Vegetables in a cheese sauce. Regular canned vegetables (not low-sodium or reduced-sodium). Regular canned tomato sauce and paste (not low-sodium or reduced-sodium). Regular tomato and vegetable juice (not low-sodium or reduced-sodium). Rosita Fire. Olives. Grains Baked goods made with fat, such as croissants, muffins, or some breads. Dry pasta or rice meal packs. Meats and other proteins Fatty cuts of meat. Ribs. Fried meat. Tomasa Blase. Bologna, salami, and other precooked or cured meats, such as sausages or meat loaves. Fat from the back of a pig (fatback). Bratwurst. Salted nuts and seeds. Canned beans with added salt. Canned or smoked fish. Whole eggs or egg yolks. Chicken or [place] with skin. Dairy Whole or 2% milk, cream, and half-and-half. Whole or full-fat cream cheese. Whole-fat or sweetened yogurt. Full-fat cheese. Nondairy creamers. Whipped toppings. Processed cheese and cheese spreads. Fats and oils Butter. Stick margarine. Lard. Shortening. Ghee. Bacon fat. Tropical oils, such as coconut, palm kernel, or palm oil. Seasonings and condiments Onion salt, garlic  salt, seasoned salt, table salt, and sea salt. Worcestershire sauce. Tartar sauce. Barbecue sauce. Teriyaki sauce. Soy sauce, including reduced-sodium. Steak sauce. Canned and packaged gravies. Fish sauce. Oyster sauce. Cocktail sauce. Store-bought horseradish. Ketchup. Mustard. Meat flavorings and tenderizers. Bouillon cubes. Hot sauces. Pre-made or packaged marinades. Pre-made or packaged taco seasonings. Relishes. Regular salad dressings. Other foods Salted popcorn and pretzels. The items listed above may not be a complete list of foods and beverages you should  avoid. Contact a dietitian for more information. Where to find more information  National Heart, Lung, and Blood Institute: PopSteam.is  American Heart Association: www.heart.org  Academy of Nutrition and Dietetics: www.eatright.org  National Kidney Foundation: www.kidney.org Summary  The DASH eating plan is a healthy eating plan that has been shown to reduce high blood pressure (hypertension). It may also reduce your risk for type 2 diabetes, heart disease, and stroke.  When on the DASH eating plan, aim to eat more fresh fruits and vegetables, whole grains, lean proteins, low-fat dairy, and heart-healthy fats.  With the DASH eating plan, you should limit salt (sodium) intake to 2,300 mg a day. If you have hypertension, you may need to reduce your sodium intake to 1,500 mg a day.  Work with your health care provider or dietitian to adjust your eating plan to your individual calorie needs. This information is not intended to replace advice given to you by your health care provider. Make sure you discuss any questions you have with your health care provider. Document Revised: 01/03/2019 Document Reviewed: 01/03/2019 Elsevier Patient Education  2021 [employer].

## 2020-06-24 NOTE — Addendum Note (Signed)
Addended by: Burman Nieves T on: 06/24/2020 09:33 AM   Modules accepted: Orders

## 2020-06-24 NOTE — Progress Notes (Signed)
Clinical Summary Ms. Victorio is a 70 y.o.female former patient of Dr Purvis Sheffield, this is our first visit together.  1. Chest pain - evaluated by Dr Purvis Sheffield initially Jan 2021 - from notes occurred in setting of severe HTN, symptoms resolved with bp control - 12/2016 echo LVEF 60-65%, no WMAs, grade I diastolic dysfunction  - some chest tightness. Left sided, occurs with activity. 6/10 in severity. No other associated symptoms. Can be constant for several ours. Not positional. - walks daily about 10 minutes, can have some assocaited chest pain.  CAD risk factors: HTN, HL, prior CVA.    2. History of TIA/Stroke  3.Resistant HTN - compliant with meds - checks home bp's in AMs 150s-160/100s  OSA screen: +snoring, no known apneic episodes, +daytime somonlence Has not had renal US, do not see renin/aldo levels previously  4. Hyperlipidemia 06/2020 TC 175 HDL 33 TG 167 LDL 114 - she is on atorvastatin 40mg   5. CKD Cr has been improving.  - followed by Dr Past Medical History:  Diagnosis Date  . Hyperlipidemia   . Hypertension   . Stroke (HCC)    2018  . Vitamin D deficiency disease 12/31/2018     Allergies  Allergen Reactions  . Lisinopril Cough     Current Outpatient Medications  Medication Sig Dispense Refill  . acetaminophen (TYLENOL) 325 MG tablet Take 650 mg by mouth as needed.    01/02/2019 amLODipine (NORVASC) 10 MG tablet Take 1 tablet by mouth once daily 30 tablet 2  . aspirin EC 81 MG tablet Take 1 tablet (81 mg total) by mouth daily. (Patient not taking: Reported on 06/17/2020) 90 tablet 0  . atorvastatin (LIPITOR) 40 MG tablet Take 1 tablet (40 mg total) by mouth daily. 30 tablet 3  . chlorthalidone (HYGROTON) 25 MG tablet Take 1 tablet (25 mg total) by mouth daily. 30 tablet 0  . Cholecalciferol (VITAMIN D-3) 125 MCG (5000 UT) TABS Take 1 tablet by mouth daily.    . cloNIDine (CATAPRES) 0.3 MG tablet Take 1 tablet (0.3 mg total) by mouth 3 (three) times  daily. 90 tablet 2  . hydrALAZINE (APRESOLINE) 100 MG tablet Take 1 tablet (100 mg total) by mouth 3 (three) times daily. 270 tablet 0  . spironolactone (ALDACTONE) 25 MG tablet Take 1 tablet (25 mg total) by mouth daily. 30 tablet 0   No current facility-administered medications for this visit.     Past Surgical History:  Procedure Laterality Date  . CESAREAN SECTION       Allergies  Allergen Reactions  . Lisinopril Cough      Family History  Problem Relation Age of Onset  . Hypertension Mother   . Hypertension Father   . Arthritis Father   . Diabetes Father   . Stroke Paternal Aunt   . Bronchitis Brother        Every year     Social History Ms. Broyles reports that she has never smoked. She has never used smokeless tobacco. Ms. Bellino reports no history of alcohol use.   Review of Systems CONSTITUTIONAL: No weight loss, fever, chills, weakness or fatigue.  HEENT: Eyes: No visual loss, blurred vision, double vision or yellow sclerae.No hearing loss, sneezing, congestion, runny nose or sore throat.  SKIN: No rash or itching.  CARDIOVASCULAR: per hpi RESPIRATORY: No shortness of breath, cough or sputum.  GASTROINTESTINAL: No anorexia, nausea, vomiting or diarrhea. No abdominal pain or blood.  GENITOURINARY: No burning on urination, no  polyuria NEUROLOGICAL: No headache, dizziness, syncope, paralysis, ataxia, numbness or tingling in the extremities. No change in bowel or bladder control.  MUSCULOSKELETAL: No muscle, back pain, joint pain or stiffness.  LYMPHATICS: No enlarged nodes. No history of splenectomy.  PSYCHIATRIC: No history of depression or anxiety.  ENDOCRINOLOGIC: No reports of sweating, cold or heat intolerance. No polyuria or polydipsia.  Marland Kitchen   Physical Examination Today's Vitals   06/24/20 0819  BP: (!) 160/100  Pulse: 80  SpO2: 98%  Weight: 197 lb 6.4 oz (89.5 kg)  Height: 5\' 2"  (1.575 m)   Body mass index is 36.1 kg/m.  Gen: resting  comfortably, no acute distress HEENT: no scleral icterus, pupils equal round and reactive, no palptable cervical adenopathy,  CV: RRR, no m/r/g no jvd Resp: Clear to auscultation bilaterally GI: abdomen is soft, non-tender, non-distended, normal bowel sounds, no hepatosplenomegaly MSK: extremities are warm, no edema.  Skin: warm, no rash Neuro:  no focal deficits Psych: appropriate affect   Diagnostic Studies  01/2019 echo Summary  1. The left ventricle is normal in size with mildly increased wall  thickness.  2. The leftventricular systolic function is normal, LVEF is visually  estimated at > 55%.  3. There is grade I diastolic dysfunction (impaired relaxation).  4. The right ventricle is normal in size, with normal systolic function.     Assessment and Plan   1. Chest pain - ongoing symptoms, she has CAD risk factors - obtain exercise nuclear stress test  2. Resistant HTN - medical therapy limited by renal dysfunction though Cr has been imprvoing over time - start labetalol 200mg  bid - order renal artery 02/2019. +OSA screening questions, refer for evaluation. She is on aldactone, hold on renin/aldo levels for now but may need in the future  3. Hyperlipidemia - not at goal of LDL <70 given prior CVA - increase atorva to 80mg  daily    F/u 6 weeks. Room to titrate labetalol if needed.    , M.D.

## 2020-06-29 ENCOUNTER — Other Ambulatory Visit (INDEPENDENT_AMBULATORY_CARE_PROVIDER_SITE_OTHER): Payer: Self-pay | Admitting: Nurse Practitioner

## 2020-06-29 DIAGNOSIS — I10 Essential (primary) hypertension: Secondary | ICD-10-CM

## 2020-07-05 ENCOUNTER — Encounter (HOSPITAL_COMMUNITY)
Admission: RE | Admit: 2020-07-05 | Discharge: 2020-07-05 | Disposition: A | Discharge status: Home / Self Care | Payer: Medicare Other | Source: Ambulatory Visit | Attending: Cardiology | Admitting: Cardiology

## 2020-07-05 ENCOUNTER — Other Ambulatory Visit: Payer: Self-pay

## 2020-07-05 ENCOUNTER — Encounter (HOSPITAL_BASED_OUTPATIENT_CLINIC_OR_DEPARTMENT_OTHER)
Admission: RE | Admit: 2020-07-05 | Discharge: 2020-07-05 | Disposition: A | Discharge status: Home / Self Care | Payer: Medicare Other | Source: Ambulatory Visit | Attending: Cardiology | Admitting: Cardiology

## 2020-07-05 DIAGNOSIS — R079 Chest pain, unspecified: Secondary | ICD-10-CM

## 2020-07-05 LAB — NM MYOCAR MULTI W/SPECT W/WALL MOTION / EF
LV dias vol: 68 mL (ref 46–106)
LV sys vol: 19 mL
Peak HR: 97 {beats}/min
RATE: 0.46
Rest HR: 71 {beats}/min
SDS: 2
SRS: 2
SSS: 4
TID: 1.58

## 2020-07-05 MED ORDER — REGADENOSON 0.4 MG/5ML IV SOLN
INTRAVENOUS | Status: AC
  Administered 2020-07-05: 0.4 mg via INTRAVENOUS
  Filled 2020-07-05: qty 5

## 2020-07-05 MED ORDER — TECHNETIUM TC 99M TETROFOSMIN IV KIT
30.0000 | PACK | Freq: Once | INTRAVENOUS | Status: AC | PRN
  Administered 2020-07-05: 28 via INTRAVENOUS

## 2020-07-05 MED ORDER — TECHNETIUM TC 99M TETROFOSMIN IV KIT
10.0000 | PACK | Freq: Once | INTRAVENOUS | Status: AC | PRN
  Administered 2020-07-05: 9 via INTRAVENOUS

## 2020-07-05 MED ORDER — SODIUM CHLORIDE FLUSH 0.9 % IV SOLN
INTRAVENOUS | Status: AC
  Administered 2020-07-05: 10 mL via INTRAVENOUS
  Filled 2020-07-05: qty 10

## 2020-07-06 ENCOUNTER — Other Ambulatory Visit (HOSPITAL_COMMUNITY): Payer: Medicare Other

## 2020-07-06 ENCOUNTER — Encounter (HOSPITAL_COMMUNITY): Payer: Medicare Other

## 2020-07-07 ENCOUNTER — Encounter: Payer: Self-pay | Admitting: Cardiovascular Disease

## 2020-07-13 ENCOUNTER — Telehealth: Payer: Self-pay | Admitting: *Deleted

## 2020-07-13 NOTE — Telephone Encounter (Signed)
Called patients daughter today and she stated that the patient did not tolerate the medication change well and saw her cardiology doctor and is being seen by hypertension clinic already. Please see lab note also in regards to this message.

## 2020-07-13 NOTE — Telephone Encounter (Signed)
-----   Message from Antoine Poche, MD sent at 07/13/2020  3:26 PM EDT ----- Normal stress test, no evidence of any significant blockages in the heart  Dominga Ferry MD

## 2020-07-15 ENCOUNTER — Ambulatory Visit (INDEPENDENT_AMBULATORY_CARE_PROVIDER_SITE_OTHER): Payer: Medicare Other | Admitting: Internal Medicine

## 2020-07-15 ENCOUNTER — Other Ambulatory Visit: Payer: Self-pay

## 2020-07-15 ENCOUNTER — Encounter (INDEPENDENT_AMBULATORY_CARE_PROVIDER_SITE_OTHER): Payer: Self-pay | Admitting: Internal Medicine

## 2020-07-15 VITALS — BP 146/86 | HR 82 | Temp 96.3°F | Ht 62.0 in | Wt 195.0 lb

## 2020-07-15 DIAGNOSIS — E1165 Type 2 diabetes mellitus with hyperglycemia: Secondary | ICD-10-CM | POA: Diagnosis not present

## 2020-07-15 DIAGNOSIS — I10 Essential (primary) hypertension: Secondary | ICD-10-CM

## 2020-07-15 MED ORDER — GLIPIZIDE 5 MG PO TABS: 5.0000 mg | ORAL_TABLET | Freq: Every day | ORAL | 3 refills | Status: DC

## 2020-07-15 NOTE — Progress Notes (Signed)
Metrics: Intervention Frequency ACO  Documented Smoking Status Yearly  Screened one or more times in 24 months  Cessation Counseling or  Active cessation medication Past 24 months  Past 24 months   Guideline developer: UpToDate (See UpToDate for funding source) Date Released: 2014       Wellness Office Visit  Subjective:  Patient ID: Danielle Jordan, female    DOB: 1950-03-01  Age: 70 y.o. MRN: [id number]  CC: This lady comes in for follow-up of uncontrolled hypertension. HPI  She had been seen by Maralyn Sago.  Increased her clonidine dose but was also seen by Dr. Wyline Mood, cardiology, who added labetalol to her medication. From the last time she was seen in this office, she was noted to have a hemoglobin A1c of 8.4%, making her diabetic.  I am not sure that she registered that this is the case.  She has been told this from our office. Past Medical History:  Diagnosis Date  . Hyperlipidemia   . Hypertension   . Stroke (HCC)    2018  . Vitamin D deficiency disease 12/31/2018   Past Surgical History:  Procedure Laterality Date  . CESAREAN SECTION       Family History  Problem Relation Age of Onset  . Hypertension Mother   . Hypertension Father   . Arthritis Father   . Diabetes Father   . Stroke Paternal Aunt   . Bronchitis Brother        Every year    Social History   Social History Narrative    Not working at this time. Has 3 boys and 1 girl. Divorced. Lives with daughter   Social History   Tobacco Use  . Smoking status: Never Smoker  . Smokeless tobacco: Never Used  Substance Use Topics  . Alcohol use: No    Current Meds  Medication Sig  . acetaminophen (TYLENOL) 325 MG tablet Take 650 mg by mouth as needed.  Marland Kitchen amLODipine (NORVASC) 10 MG tablet Take 1 tablet by mouth once daily  . atorvastatin (LIPITOR) 80 MG tablet Take 1 tablet (80 mg total) by mouth daily.  . chlorthalidone (HYGROTON) 25 MG tablet Take 1 tablet by mouth once daily  . Cholecalciferol (VITAMIN  D-3) 125 MCG (5000 UT) TABS Take 1 tablet by mouth daily.  . cloNIDine (CATAPRES) 0.3 MG tablet Take 1 tablet (0.3 mg total) by mouth 3 (three) times daily.  Marland Kitchen glipiZIDE (GLUCOTROL) 5 MG tablet Take 1 tablet (5 mg total) by mouth daily before breakfast.  . hydrALAZINE (APRESOLINE) 100 MG tablet Take 1 tablet (100 mg total) by mouth 3 (three) times daily.  Marland Kitchen labetalol (NORMODYNE) 200 MG tablet Take 1 tablet (200 mg total) by mouth 2 (two) times daily.  Marland Kitchen spironolactone (ALDACTONE) 25 MG tablet Take 1 tablet by mouth once daily     Flowsheet Row Office Visit from 06/17/2020 in [facility]  PHQ-9 Total Score 0      Objective:   Today's Vitals: BP (!) 146/86   Pulse 82   Temp (!) 96.3 F (35.7 C) (Temporal)   Ht 5\' 2"  (1.575 m)   Wt 195 lb (88.5 kg)   SpO2 96%   BMI 35.67 kg/m  Vitals with BMI 07/15/2020 06/24/2020 06/17/2020  Height 5\' 2"  5\' 2"  5\' 2"   Weight 195 lbs 197 lbs 6 oz 196 lbs 6 oz  BMI 35.66 36.1 35.91  Systolic 146 160 08/17/2020  Diastolic 86 100 102  Pulse 82 80 84  Physical Exam  She is morbidly obese.  Blood pressure however, is better.  She is alert and orientated.  During the whole visit, she was looking down at her phone playing a game.     Assessment   1. Hypertension, unspecified type   2. Uncontrolled type 2 diabetes mellitus with hyperglycemia (HCC)       Tests ordered No orders of the defined types were placed in this encounter.    Plan: 1. Continue with current antihypertensive medications. 2. I am going to start her on glipizide 5 mg daily for her diabetes. 3. Follow-up with Maralyn Sago in about 3 months   Meds ordered this encounter  Medications  . glipiZIDE (GLUCOTROL) 5 MG tablet    Sig: Take 1 tablet (5 mg total) by mouth daily before breakfast.    Dispense:  30 tablet    Refill:  3    Jolena Kittle Normajean Glasgow, MD

## 2020-07-16 NOTE — Telephone Encounter (Signed)
Pt voiced understanding

## 2020-07-28 ENCOUNTER — Ambulatory Visit (INDEPENDENT_AMBULATORY_CARE_PROVIDER_SITE_OTHER): Payer: Medicare Other

## 2020-07-28 DIAGNOSIS — N1832 Chronic kidney disease, stage 3b: Secondary | ICD-10-CM | POA: Diagnosis not present

## 2020-07-28 DIAGNOSIS — I10 Essential (primary) hypertension: Secondary | ICD-10-CM

## 2020-07-31 ENCOUNTER — Other Ambulatory Visit (INDEPENDENT_AMBULATORY_CARE_PROVIDER_SITE_OTHER): Payer: Self-pay | Admitting: Internal Medicine

## 2020-07-31 DIAGNOSIS — I10 Essential (primary) hypertension: Secondary | ICD-10-CM

## 2020-08-08 NOTE — Progress Notes (Signed)
Cardiology Office Note  Date: 08/09/2020   ID: [name], [name] 07-15-1950, MRN [id number]  PCP:  Elenore Paddy, NP  Cardiologist:  Dina Rich, MD Electrophysiologist:  None   Chief Complaint: 6-week follow-up  History of Present Illness: Danielle Jordan is a 70 y.o. female with a history of HTN, HLD, CVA, CKD.  She was last seen by Dr. Wyline Mood on 06/24/2020 with complaints of chest pain described as tightness left-sided occurring with activity with severity of 6 out of 10.  She had no other associated symptoms.  Dr. Wyline Mood ordered a nuclear stress test.  Her blood pressure remained elevated.  Labetalol was started at 200 mg p.o. twice daily.  Renal artery ultrasound was ordered.  She was referred to pulmonary for OSA screening.  She was continuing Aldactone.  Her atorvastatin was increased to 80 mg daily due to LDL not at goal of less than 70.  Dr. Wyline Mood mentioned there was room to titrate labetalol if needed.  She is here for follow-up.  She stated to nursing staff she had stopped her hydralazine about 3 months ago.  At last visit she was started on labetalol by Dr. Wyline Mood 200 mg p.o. twice daily.  We discussed restarting the hydralazine for the time being.  She states the reason she stopped the hydralazine was due to the fact that her daughter had told her there was some deleterious side effects.  She recently saw nephrology Dr. Wolfgang Phoenix and he stopped her amlodipine and started nifedipine.  She is tolerating the change well, however her blood pressure remains elevated at 152/92.  We discussed the results of her renal artery ultrasound which showed no evidence of renal artery stenosis.  She is tolerating the change to atorvastatin well.  He denies any anginal or exertional symptoms, orthostatic symptoms, CVA or TIA-like symptoms, palpitations or arrhythmias, PND, orthopnea, bleeding, claudication-like symptoms, DVT or PE-like symptoms, or lower extremity edema.  Nursing staff stated she  did not start her aspirin.  She was to be on aspirin 81 mg for history of CVA.  She has a pending sleep study.    Past Medical History:  Diagnosis Date   Hyperlipidemia    Hypertension    Stroke [facility])    2018   Vitamin D deficiency disease 12/31/2018    Past Surgical History:  Procedure Laterality Date   CESAREAN SECTION      Current Outpatient Medications  Medication Sig Dispense Refill   acetaminophen (TYLENOL) 325 MG tablet Take 650 mg by mouth as needed.     atorvastatin (LIPITOR) 80 MG tablet Take 1 tablet (80 mg total) by mouth daily. 90 tablet 1   chlorthalidone (HYGROTON) 25 MG tablet Take 1 tablet by mouth once daily 30 tablet 3   Cholecalciferol (VITAMIN D-3) 125 MCG (5000 UT) TABS Take 1 tablet by mouth daily.     cloNIDine (CATAPRES) 0.3 MG tablet Take 1 tablet (0.3 mg total) by mouth 3 (three) times daily. 90 tablet 2   glipiZIDE (GLUCOTROL) 5 MG tablet Take 1 tablet (5 mg total) by mouth daily before breakfast. 30 tablet 3   labetalol (NORMODYNE) 200 MG tablet Take 1 tablet (200 mg total) by mouth 2 (two) times daily. 180 tablet 1   NIFEdipine (PROCARDIA XL/NIFEDICAL XL) 60 MG 24 hr tablet Take 60 mg by mouth daily.     spironolactone (ALDACTONE) 25 MG tablet Take 1 tablet by mouth once daily 30 tablet 3   aspirin EC 81 MG tablet Take  1 tablet (81 mg total) by mouth daily. (Patient not taking: No sig reported) 90 tablet 0   hydrALAZINE (APRESOLINE) 100 MG tablet Take 0.5 tablets (50 mg total) by mouth 2 (two) times daily. 90 tablet 1   No current facility-administered medications for this visit.   Allergies:  Lisinopril   Social History: The patient  reports that she has never smoked. She has never used smokeless tobacco. She reports that she does not drink alcohol and does not use drugs.   Family History: The patient's family history includes Arthritis in her father; Bronchitis in her brother; Diabetes in her father; Hypertension in her father and mother; Stroke  in her paternal aunt.   ROS:  Please see the history of present illness. Otherwise, complete review of systems is positive for none.  All other systems are reviewed and negative.   Physical Exam: VS:  BP (!) 152/92   Pulse 90   Ht 5\' 2"  (1.575 m)   Wt 196 lb 9.6 oz (89.2 kg)   SpO2 97%   BMI 35.96 kg/m , BMI Body mass index is 35.96 kg/m.  Wt Readings from Last 3 Encounters:  08/09/20 196 lb 9.6 oz (89.2 kg)  07/15/20 195 lb (88.5 kg)  06/24/20 197 lb 6.4 oz (89.5 kg)    General: Patient appears comfortable at rest. HEENT: Conjunctiva and lids normal, oropharynx clear with moist mucosa. Neck: Supple, no elevated JVP or carotid bruits, no thyromegaly. Lungs: Clear to auscultation, nonlabored breathing at rest. Cardiac: Regular rate and rhythm, no S3 or significant systolic murmur, no pericardial rub. Abdomen: Soft, nontender, no hepatomegaly, bowel sounds present, no guarding or rebound. Extremities: No pitting edema, distal pulses 2+. Skin: Warm and dry. Musculoskeletal: No kyphosis. Neuropsychiatric: Alert and oriented x3, affect grossly appropriate.  ECG:    Recent Labwork: 06/17/2020: ALT 13; AST 13; BUN 32; Creat 1.39; Potassium 4.0; Sodium 136     Component Value Date/Time   CHOL 175 06/17/2020 1559   TRIG 167 (H) 06/17/2020 1559   HDL 33 (L) 06/17/2020 1559   CHOLHDL 5.3 (H) 06/17/2020 1559   VLDL 23 08/16/2006 0400   LDLCALC 114 (H) 06/17/2020 1559    Other Studies Reviewed Today:    Renal artery 08/17/2020 07/28/2020 Summary: Largest Aortic Diameter: 2.1 cm Renal: Mesenteric: Normal Celiac artery and Superior Mesenteric artery findings. Patent proximal IVC. *See table(s) above for measurements and observations. Diagnosing physician: 07/30/2020 MD Electronically signed by Nanetta Batty MD on 07/28/2020 at 12:18:34 PM. Right: Normal size right kidney. Normal right Resisitive Index. Normal cortical thickness of right kidney. No evidence of right renal artery  stenosis. RRV flow present. Cyst(s) noted. Increased echogenicity seen in the right renal cortex. Multiple cysts seen in the right kidney, largest seen in the mid hilar area measuring 3.4 cm x 2.7 cm. Left: Normal cortical thickness of the left kidney. No evidence of left renal artery stenosis. LRV flow present. Normal size of left kidney. Increased echogenicity seen in the left renal cortex. Left kidney is 2cm smaller than the right kidney but still within normal limits     Lexiscan myoview 07/05/2020 Narrative & Impression  Lexiscan stress with less than 1 mm ST depression inferior and laterally during / after infusion. Not signiciant for ischemia Myoview scan with normal perfusion NO ischemia or scar TID noted, significance unknown. LVEF calculated at 72% with normal wall motion. This is a low risk study.         01/2019 echo Summary  1. The left ventricle is normal in size with mildly increased wall  thickness.    2. The left ventricular systolic function is normal, LVEF is visually  estimated at > 55%.    3. There is grade I diastolic dysfunction (impaired relaxation).    4. The right ventricle is normal in size, with normal systolic function.       Assessment and Plan:  1. Resistant hypertension   2. Chest pain, unspecified type   3. Mixed hyperlipidemia   4. Sleep apnea, unspecified type   5. Stage 3b chronic kidney disease (HCC)   6. Hypertension, unspecified type   7. History of CVA (cerebrovascular accident)    1. Resistant hypertension At last visit she was started on labetalol 200 mg p.o. twice daily.  Nursing staff today states she has not taken her hydralazine for approximately 3 months.  She has been on hydralazine 100 mg p.o. 3 times daily.  Please continue labetalol at 200 mg p.o. twice daily.  Restart hydralazine at 50 mg p.o. twice daily.  Continue chlorthalidone 25 mg daily.  Continue clonidine 0.3 mg 3 times daily.  Nephrology recently stopped  amlodipine and started nifedipine 60 mg daily.  Continue nifedipine 60 mg daily.  Continue spironolactone 25 mg daily.  Please refer to hypertension clinic for management.  Recent renal ultrasound was negative for renal artery stenosis.  2. Chest pain, unspecified type She currently denies any anginal symptoms.  3. Mixed hyperlipidemia Continue atorvastatin 80 mg daily.  Recent lipid panel on 06/17/2020: TC 175, HDL 33, LDL 114, TG 167  4. Sleep apnea, unspecified type She has a pending sleep study consult with Dr. Vassie Loll on Wednesday, July 27.  5. Stage 3b chronic kidney disease [facility]) Recently saw Dr. Wolfgang Phoenix nephrology who switched her amlodipine to nifedipine 60 mg p.o. daily.  She will continue to follow with Dr. Wolfgang Phoenix.  6.  History of CVA No focal neurologic deficits today.  She had been described low-dose aspirin for CVA prophylaxis.  She states she has not started the medication.  Advised to start aspirin 81 mg p.o. daily  Medication Adjustments/Labs and Tests Ordered: Current medicines are reviewed at length with the patient today.  Concerns regarding medicines are outlined above.   Disposition: Follow-up with Dr. Wyline Mood or APP 3 months  Signed, Rennis Harding, NP 08/09/2020 10:27 AM    [facility] [address] [place], [place], [place] [address] Phone: [phone]; Fax: [phone]

## 2020-08-09 ENCOUNTER — Ambulatory Visit (INDEPENDENT_AMBULATORY_CARE_PROVIDER_SITE_OTHER): Payer: Medicare Other | Admitting: Family Medicine

## 2020-08-09 ENCOUNTER — Encounter: Payer: Self-pay | Admitting: Family Medicine

## 2020-08-09 ENCOUNTER — Other Ambulatory Visit: Payer: Self-pay

## 2020-08-09 VITALS — BP 152/92 | HR 90 | Ht 62.0 in | Wt 196.6 lb

## 2020-08-09 DIAGNOSIS — I10 Essential (primary) hypertension: Secondary | ICD-10-CM | POA: Diagnosis not present

## 2020-08-09 DIAGNOSIS — R079 Chest pain, unspecified: Secondary | ICD-10-CM | POA: Diagnosis not present

## 2020-08-09 DIAGNOSIS — E782 Mixed hyperlipidemia: Secondary | ICD-10-CM

## 2020-08-09 DIAGNOSIS — Z8673 Personal history of transient ischemic attack (TIA), and cerebral infarction without residual deficits: Secondary | ICD-10-CM

## 2020-08-09 DIAGNOSIS — N1832 Chronic kidney disease, stage 3b: Secondary | ICD-10-CM

## 2020-08-09 DIAGNOSIS — G4730 Sleep apnea, unspecified: Secondary | ICD-10-CM | POA: Diagnosis not present

## 2020-08-09 MED ORDER — HYDRALAZINE HCL 100 MG PO TABS: 50.0000 mg | ORAL_TABLET | Freq: Two times a day (BID) | ORAL | 1 refills | Status: DC

## 2020-08-09 NOTE — Patient Instructions (Addendum)
Medication Instructions:  Your physician recommends that you make the following changes to your medications Restart hydralazine at 50 mg by mouth twice daily (break your 100 mg tablet in 1/2) Please start aspirin 81 mg as requested previously Continue other medications the same  Labwork: none  Testing/Procedures: none  Follow-Up: Your physician recommends that you schedule a follow-up appointment in: 3 months You have been referred to the Hypertension Clinic  Any Other Special Instructions Will Be Listed Below (If Applicable).  If you need a refill on your cardiac medications before your next appointment, please call your pharmacy.

## 2020-08-26 ENCOUNTER — Encounter: Payer: Self-pay | Admitting: *Deleted

## 2020-09-08 ENCOUNTER — Encounter: Payer: Self-pay | Admitting: Pulmonary Disease

## 2020-09-08 ENCOUNTER — Other Ambulatory Visit: Payer: Self-pay

## 2020-09-08 ENCOUNTER — Ambulatory Visit (INDEPENDENT_AMBULATORY_CARE_PROVIDER_SITE_OTHER): Payer: Medicare Other | Admitting: Pulmonary Disease

## 2020-09-08 DIAGNOSIS — I10 Essential (primary) hypertension: Secondary | ICD-10-CM | POA: Diagnosis not present

## 2020-09-08 DIAGNOSIS — R0683 Snoring: Secondary | ICD-10-CM | POA: Insufficient documentation

## 2020-09-08 NOTE — Progress Notes (Signed)
Subjective:    Patient ID: Danielle Jordan, female    DOB: [birth year], 70 y.o.   MRN: [id number]  HPI  Chief Complaint  Patient presents with   Consult    Patient states that she snores and wakes up 3-4 times a night. Thinks this is making her have high blood pressure. Denies gasping for air or stops breathing    70 year old woman with refractory hypertension presents for evaluation of sleep disordered breathing  PMH -hypertension requiring 6 medications, diabetes, CVA 2018 with residual left-sided weakness, CKD stage IIIb  Loud snoring has been noted by family members.  She lives with her daughter and grandkids.  She reports occasionally dozing off during the daytime.  There are occasional gasping episodes that wake her up from sleep. Epworth sleepiness score is 7 and she reports some sleepiness while watching TV or lying down to rest in the afternoons. Bedtime is anywhere between 11 PM and 2 AM, TV stays on through the night, she is in bed watching TV, sleep latency up to 30 minutes, she sleeps on her side with 2 pillows, reports 3-4 nocturnal awakenings including nocturia and is out of bed by 7 AM feeling rested with dryness of mouth but denies headaches There is no history suggestive of cataplexy, sleep paralysis or parasomnias    Past Medical History:  Diagnosis Date   Hyperlipidemia    Hypertension    Stroke (HCC)    2018   Vitamin D deficiency disease 12/31/2018   Past Surgical History:  Procedure Laterality Date   CESAREAN SECTION      Allergies  Allergen Reactions   Lisinopril Cough    Social History   Socioeconomic History   Marital status: Divorced    Spouse name: Not on file   Number of children: Not on file   Years of education: Not on file   Highest education level: Not on file  Occupational History   Not on file  Tobacco Use   Smoking status: Never   Smokeless tobacco: Never  Vaping Use   Vaping Use: Never used  Substance and Sexual Activity    Alcohol use: No   Drug use: No   Sexual activity: Not on file  Other Topics Concern   Not on file  Social History Narrative    Not working at this time. Has 3 boys and 1 girl. Divorced. Lives with daughter   Social Determinants of Health   Financial Resource Strain: Not on file  Food Insecurity: Not on file  Transportation Needs: Not on file  Physical Activity: Not on file  Stress: Not on file  Social Connections: Not on file  Intimate Partner Violence: Not on file      Family History  Problem Relation Age of Onset   Hypertension Mother    Hypertension Father    Arthritis Father    Diabetes Father    Stroke Paternal Aunt    Bronchitis Brother        Every year      Review of Systems Constitutional: negative for anorexia, fevers and sweats  Eyes: negative for irritation, redness and visual disturbance  Ears, nose, mouth, throat, and face: negative for earaches, epistaxis, nasal congestion and sore throat  Respiratory: negative for cough, dyspnea on exertion, sputum and wheezing  Cardiovascular: negative for chest pain, dyspnea, lower extremity edema, orthopnea, palpitations and syncope  Gastrointestinal: negative for abdominal pain, constipation, diarrhea, melena, nausea and vomiting  Genitourinary:negative for dysuria, frequency and hematuria  Hematologic/lymphatic: negative  for bleeding, easy bruising and lymphadenopathy  Musculoskeletal:negative for arthralgias, muscle weakness and stiff joints  Neurological: negative for coordination problems, gait problems, headaches and weakness  Endocrine: negative for diabetic symptoms including polydipsia, polyuria and weight loss     Objective:   Physical Exam  Gen. Pleasant, obese, in no distress, normal affect ENT - no pallor,icterus, no post nasal drip, class 2 airway, large tongue Neck: No JVD, no thyromegaly, no carotid bruits Lungs: no use of accessory muscles, no dullness to percussion, decreased without rales or  rhonchi  Cardiovascular: Rhythm regular, heart sounds  normal, no murmurs or gallops, no peripheral edema Abdomen: soft and non-tender, no hepatosplenomegaly, BS normal. Musculoskeletal: No deformities, no cyanosis or clubbing Neuro:  alert, non focal, no tremors       Assessment & Plan:

## 2020-09-08 NOTE — Patient Instructions (Signed)
Home sleep test to check for sleep apnea 

## 2020-09-08 NOTE — Assessment & Plan Note (Signed)
Given excessive daytime somnolence, refractory hypertension, narrow pharyngeal exam,  & loud snoring, obstructive sleep apnea is possible & an overnight polysomnogram will be scheduled as a home study. The pathophysiology of obstructive sleep apnea , it's cardiovascular consequences & modes of treatment including CPAP were discused with the patient in detail & they evidenced understanding.  Pretest probability is intermediate.  She would be amenable to using a CPAP if needed.  BMI is 34 so would advise 5 to 10 pound weight loss if we are to consider hypoglossal nerve stimulation

## 2020-09-08 NOTE — Assessment & Plan Note (Signed)
Refractory hypertension requiring 6 months. Dryness of mouth may be due to clonidine.  Hydralazine is just been resumed.  Blood pressure slight high today

## 2020-09-13 ENCOUNTER — Ambulatory Visit: Payer: Medicare Other | Admitting: Neurology

## 2020-09-30 ENCOUNTER — Ambulatory Visit (INDEPENDENT_AMBULATORY_CARE_PROVIDER_SITE_OTHER): Payer: Medicare Other | Admitting: Nurse Practitioner

## 2020-11-08 NOTE — Progress Notes (Signed)
Cardiology Office Note  Date: 11/09/2020   ID: Danielle Jordan, DOB Jul 04, 1950, MRN [id number]  PCP:  Pcp, No  Cardiologist:  Dina Rich, MD Electrophysiologist:  None   Chief Complaint: 6-week follow-up  History of Present Illness: Danielle Jordan is a 70 y.o. female with a history of HTN, HLD, CVA, CKD.  She was last seen by Dr. Wyline Mood on 06/24/2020 with complaints of chest pain described as tightness left-sided occurring with activity with severity of 6 out of 10.  She had no other associated symptoms.  Dr. Wyline Mood ordered a nuclear stress test.  Her blood pressure remained elevated.  Labetalol was started at 200 mg p.o. twice daily.  Renal artery ultrasound was ordered.  She was referred to pulmonary for OSA screening.  She was continuing Aldactone.  Her atorvastatin was increased to 80 mg daily due to LDL not at goal of less than 70.  Dr. Wyline Mood mentioned there was room to titrate labetalol if needed.  At last follow-up she stated tated to nursing staff she had stopped her hydralazine about 3 months prior.  At a prior visit she was started on labetalol by Dr. Wyline Mood 200 mg p.o. twice daily.  We discussed restarting the hydralazine for the time being.  She stated the reason she stopped the hydralazine was due to the fact that her daughter had told her there was some deleterious side effects.  She recently saw nephrology Dr. Wolfgang Phoenix and he stopped her amlodipine and started nifedipine.  She was tolerating the change well, however her blood pressure remains elevated at 152/92.  We discussed the results of her renal artery ultrasound which showed no evidence of renal artery stenosis.  She was tolerating the change to atorvastatin well.  He denies any anginal or exertional symptoms, orthostatic symptoms, CVA or TIA-like symptoms, palpitations or arrhythmias, PND, orthopnea, bleeding, claudication-like symptoms, DVT or PE-like symptoms, or lower extremity edema.  Nursing staff stated she did not  start her aspirin.  She was to be on aspirin 81 mg for history of CVA.  She has a pending sleep study.  She is here today for follow-up.  She was scheduled to see Dr. Duke Salvia at hypertension clinic this morning at 8 AM but elected to come here instead.  Her blood pressure remains elevated.  States she is taking medication as directed.  She denies any other issues.  No anginal symptoms, exertional symptoms, orthostatic symptoms, CVA or TIA-like symptoms, PND, orthopnea, palpitations, bleeding, claudication, DVT or PE-like symptoms, or lower extremity edema.  She has an upcoming sleep study per Dr. Vassie Loll.  She recently saw Dr. Wolfgang Phoenix on 08/20/2020 and her nifedipine XL was increased to 90 mg daily.   Past Medical History:  Diagnosis Date   Hyperlipidemia    Hypertension    Stroke [facility])    2018   Vitamin D deficiency disease 12/31/2018    Past Surgical History:  Procedure Laterality Date   CESAREAN SECTION      Current Outpatient Medications  Medication Sig Dispense Refill   acetaminophen (TYLENOL) 325 MG tablet Take 650 mg by mouth as needed.     aspirin EC 81 MG tablet Take 1 tablet (81 mg total) by mouth daily. 90 tablet 0   chlorthalidone (HYGROTON) 25 MG tablet Take 1 tablet by mouth once daily 30 tablet 3   Cholecalciferol (VITAMIN D-3) 125 MCG (5000 UT) TABS Take 1 tablet by mouth daily.     cloNIDine (CATAPRES) 0.3 MG tablet Take 1 tablet (0.3  mg total) by mouth 3 (three) times daily. 90 tablet 2   glipiZIDE (GLUCOTROL) 5 MG tablet Take 1 tablet (5 mg total) by mouth daily before breakfast. 30 tablet 3   hydrALAZINE (APRESOLINE) 100 MG tablet Take 0.5 tablets (50 mg total) by mouth 2 (two) times daily. 90 tablet 1   labetalol (NORMODYNE) 200 MG tablet Take 1 tablet (200 mg total) by mouth 2 (two) times daily. 180 tablet 1   NIFEdipine (PROCARDIA XL/NIFEDICAL XL) 60 MG 24 hr tablet Take 60 mg by mouth daily.     spironolactone (ALDACTONE) 25 MG tablet Take 1 tablet by mouth once  daily 30 tablet 3   atorvastatin (LIPITOR) 80 MG tablet Take 1 tablet (80 mg total) by mouth daily. 90 tablet 2   No current facility-administered medications for this visit.   Allergies:  Lisinopril   Social History: The patient  reports that she has never smoked. She has never used smokeless tobacco. She reports that she does not drink alcohol and does not use drugs.   Family History: The patient's family history includes Arthritis in her father; Bronchitis in her brother; Diabetes in her father; Hypertension in her father and mother; Stroke in her paternal aunt.   ROS:  Please see the history of present illness. Otherwise, complete review of systems is positive for none.  All other systems are reviewed and negative.   Physical Exam: VS:  BP (!) 142/100   Pulse 88   Ht 5\' 2"  (1.575 m)   Wt 187 lb (84.8 kg)   SpO2 98%   BMI 34.20 kg/m , BMI Body mass index is 34.2 kg/m.  Wt Readings from Last 3 Encounters:  11/09/20 187 lb (84.8 kg)  09/08/20 190 lb 12.8 oz (86.5 kg)  08/09/20 196 lb 9.6 oz (89.2 kg)    General: Patient appears comfortable at rest. Neck: Supple, no elevated JVP or carotid bruits, no thyromegaly. Lungs: Clear to auscultation, nonlabored breathing at rest. Cardiac: Regular rate and rhythm, no S3 or significant systolic murmur, no pericardial rub. Extremities: No pitting edema, distal pulses 2+. Skin: Warm and dry. Musculoskeletal: No kyphosis. Neuropsychiatric: Alert and oriented x3, affect grossly appropriate.  ECG:    Recent Labwork: 06/17/2020: ALT 13; AST 13; BUN 32; Creat 1.39; Potassium 4.0; Sodium 136     Component Value Date/Time   CHOL 175 06/17/2020 1559   TRIG 167 (H) 06/17/2020 1559   HDL 33 (L) 06/17/2020 1559   CHOLHDL 5.3 (H) 06/17/2020 1559   VLDL 23 08/16/2006 0400   LDLCALC 114 (H) 06/17/2020 1559    Other Studies Reviewed Today:    Renal artery 08/17/2020 07/28/2020 Summary: Largest Aortic Diameter: 2.1 cm Renal: Mesenteric: Normal  Celiac artery and Superior Mesenteric artery findings. Patent proximal IVC. *See table(s) above for measurements and observations. Diagnosing physician: 07/30/2020 MD Electronically signed by Nanetta Batty MD on 07/28/2020 at 12:18:34 PM. Right: Normal size right kidney. Normal right Resisitive Index. Normal cortical thickness of right kidney. No evidence of right renal artery stenosis. RRV flow present. Cyst(s) noted. Increased echogenicity seen in the right renal cortex. Multiple cysts seen in the right kidney, largest seen in the mid hilar area measuring 3.4 cm x 2.7 cm. Left: Normal cortical thickness of the left kidney. No evidence of left renal artery stenosis. LRV flow present. Normal size of left kidney. Increased echogenicity seen in the left renal cortex. Left kidney is 2cm smaller than the right kidney but still within normal limits  Steffanie Dunn 07/05/2020 Narrative & Impression  Lexiscan stress with less than 1 mm ST depression inferior and laterally during / after infusion. Not signiciant for ischemia Myoview scan with normal perfusion NO ischemia or scar TID noted, significance unknown. LVEF calculated at 72% with normal wall motion. This is a low risk study.         01/2019 echo Summary    1. The left ventricle is normal in size with mildly increased wall  thickness.    2. The left ventricular systolic function is normal, LVEF is visually  estimated at > 55%.    3. There is grade I diastolic dysfunction (impaired relaxation).    4. The right ventricle is normal in size, with normal systolic function.       Assessment and Plan:  1. Resistant hypertension   2. Mixed hyperlipidemia   3. Sleep apnea, unspecified type   4. Stage 3b chronic kidney disease (HCC)   5. History of CVA (cerebrovascular accident)     1. Resistant hypertension Blood pressure remains elevated today.  She was supposed to see Dr. Duke Salvia for the hypertension clinic this  morning but elected to come here instead.  Blood pressure continues to be elevated.  Blood pressure today 142/100.  We will increase hydralazine to 100 mg p.o. twice daily.  Continue labetalol 200 mg p.o. twice daily.  Continue chlorthalidone 25 mg daily.  Continue clonidine 0.3 mg p.o. 3 times daily.  Continue nifedipine XL 90 mg daily.  Please really-refer her to hypertension clinic for evaluation and management.  2. Mixed hyperlipidemia Continue atorvastatin 80 mg daily.  Recent lipid panel on 06/17/2020: TC 175, HDL 33, LDL 114, TG 167  3. Sleep apnea, unspecified type She recently saw Dr. Vassie Loll and has been ordered a home sleep study.  4. Stage 3b chronic kidney disease (HCC) Recently had follow-up with Dr. Wolfgang Phoenix nephrology who increased her nifedipine to 90 mg daily   She will continue to follow with Dr. Wolfgang Phoenix.  5.  History of CVA No focal neurologic deficits today.  She had been prescribed low-dose aspirin for CVA prophylaxis.  Continue aspirin 81 mg daily.  Medication Adjustments/Labs and Tests Ordered: Current medicines are reviewed at length with the patient today.  Concerns regarding medicines are outlined above.   Disposition: Follow-up with Dr. Wyline Mood or APP 6 months  Signed, Rennis Harding, NP 11/09/2020 10:28 AM    [facility] at [facility] [address] [place], [place], [place] [address] Phone: [phone]; Fax: [phone]

## 2020-11-09 ENCOUNTER — Ambulatory Visit (INDEPENDENT_AMBULATORY_CARE_PROVIDER_SITE_OTHER): Payer: Medicare Other | Admitting: Family Medicine

## 2020-11-09 ENCOUNTER — Encounter: Payer: Self-pay | Admitting: Family Medicine

## 2020-11-09 ENCOUNTER — Ambulatory Visit (HOSPITAL_BASED_OUTPATIENT_CLINIC_OR_DEPARTMENT_OTHER): Payer: Medicare Other | Admitting: Cardiovascular Disease

## 2020-11-09 VITALS — BP 142/100 | HR 88 | Ht 62.0 in | Wt 187.0 lb

## 2020-11-09 DIAGNOSIS — I10 Essential (primary) hypertension: Secondary | ICD-10-CM

## 2020-11-09 DIAGNOSIS — E782 Mixed hyperlipidemia: Secondary | ICD-10-CM

## 2020-11-09 DIAGNOSIS — Z8673 Personal history of transient ischemic attack (TIA), and cerebral infarction without residual deficits: Secondary | ICD-10-CM

## 2020-11-09 DIAGNOSIS — G4730 Sleep apnea, unspecified: Secondary | ICD-10-CM

## 2020-11-09 DIAGNOSIS — N1832 Chronic kidney disease, stage 3b: Secondary | ICD-10-CM | POA: Diagnosis not present

## 2020-11-09 MED ORDER — ATORVASTATIN CALCIUM 80 MG PO TABS: 80.0000 mg | ORAL_TABLET | Freq: Every day | ORAL | 2 refills | Status: DC

## 2020-11-09 MED ORDER — HYDRALAZINE HCL 100 MG PO TABS: 100.0000 mg | ORAL_TABLET | Freq: Two times a day (BID) | ORAL | 1 refills | Status: DC

## 2020-11-09 NOTE — Patient Instructions (Signed)
Medication Instructions:  Your physician has recommended you make the following change in your medication:  Increase hydralazine to 100 mg twice daily Continue other medications the same  Labwork: none  Testing/Procedures: none  Follow-Up: Your physician recommends that you schedule a follow-up appointment in: 6 months  Any Other Special Instructions Will Be Listed Below (If Applicable). Reschedule appointment in the Hypertension Clinic (Dr. Duke Salvia)  If you need a refill on your cardiac medications before your next appointment, please call your pharmacy.

## 2021-01-10 ENCOUNTER — Encounter (HOSPITAL_BASED_OUTPATIENT_CLINIC_OR_DEPARTMENT_OTHER): Payer: Self-pay | Admitting: Cardiovascular Disease

## 2021-01-10 ENCOUNTER — Encounter: Payer: Self-pay | Admitting: *Deleted

## 2021-01-10 ENCOUNTER — Ambulatory Visit (INDEPENDENT_AMBULATORY_CARE_PROVIDER_SITE_OTHER): Payer: Medicare Other | Admitting: Cardiovascular Disease

## 2021-01-10 ENCOUNTER — Other Ambulatory Visit: Payer: Self-pay

## 2021-01-10 VITALS — BP 172/99 | HR 70 | Ht 62.0 in | Wt 191.4 lb

## 2021-01-10 DIAGNOSIS — R400 Somnolence: Secondary | ICD-10-CM

## 2021-01-10 DIAGNOSIS — Z006 Encounter for examination for normal comparison and control in clinical research program: Secondary | ICD-10-CM

## 2021-01-10 DIAGNOSIS — R0683 Snoring: Secondary | ICD-10-CM | POA: Diagnosis not present

## 2021-01-10 DIAGNOSIS — N1832 Chronic kidney disease, stage 3b: Secondary | ICD-10-CM | POA: Diagnosis not present

## 2021-01-10 DIAGNOSIS — I10 Essential (primary) hypertension: Secondary | ICD-10-CM | POA: Diagnosis not present

## 2021-01-10 DIAGNOSIS — E782 Mixed hyperlipidemia: Secondary | ICD-10-CM

## 2021-01-10 DIAGNOSIS — E876 Hypokalemia: Secondary | ICD-10-CM

## 2021-01-10 MED ORDER — HYDRALAZINE HCL 100 MG PO TABS: 100.0000 mg | ORAL_TABLET | Freq: Three times a day (TID) | ORAL | 1 refills | Status: DC

## 2021-01-10 MED ORDER — VALSARTAN 160 MG PO TABS: 80.0000 mg | ORAL_TABLET | Freq: Every day | ORAL | 1 refills | Status: DC

## 2021-01-10 MED ORDER — VALSARTAN 160 MG PO TABS: 160.0000 mg | ORAL_TABLET | Freq: Every day | ORAL | 1 refills | Status: DC

## 2021-01-10 NOTE — Progress Notes (Signed)
Advanced Hypertension Clinic Initial Assessment:    Date:  01/10/2021   ID:  Danielle Jordan, DOB [birth year], MRN [id number]  PCP:  No primary care provider on file.  Cardiologist:  Dina Rich, MD  Nephrologist:  Referring MD: Netta Neat., NP   CC: Hypertension  History of Present Illness:    Danielle Jordan is a 70 y.o. female with a hx of hypertension, hyperlipidemia, CKD, and stroke here to establish care in the Advanced Hypertension Clinic.  She has been evaluated by her care for chest pain.  She had a nuclear stress test 06/2020 she has struggled with resistant hypertension and had renal artery Dopplers 07/2020 that were normal.  She saw Nena Polio 10/2020.  She noted that her nursing staff had stopped her hydralazine.  Prior to that she was on labetalol 200 mg twice daily.  Hydralazine was stopped because her daughter had been told there could be potential adverse side effects.  She did not actually have any side effects to the medication.  Her nephrologist stopped amlodipine and switch her to nifedipine.  Given that her blood pressures were still elevated when she saw Mardelle Matte she was referred to advanced hypertension clinic for further management.  She was started back on the hydralazine.  She was first diagnosed with hypertension in her 42s.  It was initially well-controlled until the time of her stroke.  Since then she has struggled.  She likes to walk for exercise but has not been doing it as much lately due to the weather.  She notes that her blood pressures been a little better controlled on nifedipine than when she was on amlodipine.  She does check her blood pressure at home but does not think that her machine is accurate.  It shows a big differential between the left and right arms whenever she checks it.  She is very consistent with taking her medications.  She has no alcohol or caffeine intake and mostly drinks water.  She does not take any supplements and does not cook with  salt.   Previous antihypertensives: Lisinopril-cough  Past Medical History:  Diagnosis Date   Hyperlipidemia    Hypertension    Resistant hypertension    Stroke (HCC)    2018   Vitamin D deficiency disease 12/31/2018    Past Surgical History:  Procedure Laterality Date   CESAREAN SECTION      Current Medications: Current Meds  Medication Sig   acetaminophen (TYLENOL) 325 MG tablet Take 650 mg by mouth as needed.   aspirin EC 81 MG tablet Take 1 tablet (81 mg total) by mouth daily.   atorvastatin (LIPITOR) 80 MG tablet Take 1 tablet (80 mg total) by mouth daily.   chlorthalidone (HYGROTON) 25 MG tablet Take 1 tablet by mouth once daily   Cholecalciferol (VITAMIN D-3) 125 MCG (5000 UT) TABS Take 1 tablet by mouth daily.   cloNIDine (CATAPRES) 0.3 MG tablet Take 1 tablet (0.3 mg total) by mouth 3 (three) times daily.   glipiZIDE (GLUCOTROL) 5 MG tablet Take 1 tablet (5 mg total) by mouth daily before breakfast.   labetalol (NORMODYNE) 200 MG tablet Take 1 tablet (200 mg total) by mouth 2 (two) times daily.   NIFEdipine (PROCARDIA XL/NIFEDICAL-XL) 90 MG 24 hr tablet Take 90 mg by mouth daily.   spironolactone (ALDACTONE) 25 MG tablet Take 1 tablet by mouth once daily   [DISCONTINUED] hydrALAZINE (APRESOLINE) 100 MG tablet Take 1 tablet (100 mg total) by mouth 2 (two) times  daily.   [DISCONTINUED] valsartan (DIOVAN) 160 MG tablet Take 0.5 tablets (80 mg total) by mouth daily.     Allergies:   Lisinopril   Social History   Socioeconomic History   Marital status: Divorced    Spouse name: Not on file   Number of children: Not on file   Years of education: Not on file   Highest education level: Not on file  Occupational History   Not on file  Tobacco Use   Smoking status: Never   Smokeless tobacco: Never  Vaping Use   Vaping Use: Never used  Substance and Sexual Activity   Alcohol use: No   Drug use: No   Sexual activity: Not on file  Other Topics Concern   Not on  file  Social History Narrative    Not working at this time. Has 3 boys and 1 girl. Divorced. Lives with daughter   Social Determinants of Health   Financial Resource Strain: Not on file  Food Insecurity: Not on file  Transportation Needs: Not on file  Physical Activity: Not on file  Stress: Not on file  Social Connections: Not on file     Family History: The patient's family history includes Arthritis in her father; Bronchitis in her brother; Diabetes in her father; Hypertension in her father, mother, son, and son.  ROS:   Please see the history of present illness.    All other systems reviewed and are negative.  EKGs/Labs/Other Studies Reviewed:    EKG:  EKG is ordered today.  The ekg ordered today demonstrates sinus rhythm.  Rate 70 bpm.  Nonspecific T wave abnormalities.   Recent Labs: 06/17/2020: ALT 13; BUN 32; Creat 1.39; Potassium 4.0; Sodium 136   Recent Lipid Panel    Component Value Date/Time   CHOL 175 06/17/2020 1559   TRIG 167 (H) 06/17/2020 1559   HDL 33 (L) 06/17/2020 1559   CHOLHDL 5.3 (H) 06/17/2020 1559   VLDL 23 08/16/2006 0400   LDLCALC 114 (H) 06/17/2020 1559    Physical Exam:   VS:  BP (!) 172/99 (BP Location: Left Arm, Patient Position: Sitting, Cuff Size: Large)   Pulse 70   Ht 5\' 2"  (1.575 m)   Wt 191 lb 6.4 oz (86.8 kg)   BMI 35.01 kg/m  , BMI Body mass index is 35.01 kg/m. GENERAL:  Well appearing HEENT: Pupils equal round and reactive, fundi not visualized, oral mucosa unremarkable NECK:  No jugular venous distention, waveform within normal limits, carotid upstroke brisk and symmetric, no bruits, no thyromegaly LUNGS:  Clear to auscultation bilaterally HEART:  RRR.  PMI not displaced or sustained,S1 and S2 within normal limits, no S3, no S4, no clicks, no rubs, no murmurs ABD:  Flat, positive bowel sounds normal in frequency in pitch, no bruits, no rebound, no guarding, no midline pulsatile mass, no hepatomegaly, no splenomegaly EXT:  2  plus pulses throughout, trace edema, no cyanosis no clubbing SKIN:  No rashes no nodules NEURO:  Cranial nerves II through XII grossly intact, motor grossly intact throughout PSYCH:  Cognitively intact, oriented to person place and time   ASSESSMENT/PLAN:    Resistant hypertension Blood pressure remains uncontrolled despite being on multiple agents.  She already had renal artery Dopplers.  She is on spironolactone and her blood pressure remains poorly controlled, making hyperaldosteronism less likely.  We will check renin and aldosterone levels with her blood pressures are better controlled and were able to hold her spironolactone.  For now, continue  chlorthalidone, clonidine, hydralazine, labetalol, nifedipine, and spironolactone.  Add valsartan 160 mg daily.  She will track her blood pressures.  We will also refer her to the PR EP program to the [facility] in [place].  She will continue to limit her sodium intake and try to increase her exercise to at least 150 minutes weekly.  She is interested in enrolling in the [employer] remote patient monitoring program and participating in RPM study.  CKD (chronic kidney disease) Add ARB as above.  Check a BMP in a week.  She follows with nephrology.  Blood pressure control as above.  Hypokalemia Hyperaldosteronism work-up once more medically stable and blood pressure is down enough to hold her spironolactone.  Hyperlipidemia Continue atorvastatin.   Screening for Secondary Hypertension:  Causes 01/10/2021  Drugs/Herbals Screened     - Comments limits salt, rare caffeine.no EtOH.  Renovascular HTN Screened  Sleep Apnea Screened     - Comments will get sleep study  Thyroid Disease Screened     - Comments check TSH    Relevant Labs/Studies: Basic Labs Latest Ref Rng & Units 06/17/2020 06/17/2019 03/12/2019  Sodium 135 - 146 mmol/L 136 137 138  Potassium 3.5 - 5.3 mmol/L 4.0 3.8 4.0  Creatinine 0.50 - 0.99 mg/dL 3.08(M) 5.78(I) 6.96(E)           Renovascular  07/28/2020  Renal Artery [place] Completed Yes      Disposition:    FU with MD/PharmD in 1 month    Medication Adjustments/Labs and Tests Ordered: Current medicines are reviewed at length with the patient today.  Concerns regarding medicines are outlined above.  Orders Placed This Encounter  Procedures   TSH   Basic metabolic panel   Amb Referral To Provider Referral Exercise Program (P.R.E.P)   Cantril's Ladder Assessment   EKG 12-Lead   Split night study   Meds ordered this encounter  Medications   DISCONTD: valsartan (DIOVAN) 160 MG tablet    Sig: Take 0.5 tablets (80 mg total) by mouth daily.    Dispense:  90 tablet    Refill:  1   hydrALAZINE (APRESOLINE) 100 MG tablet    Sig: Take 1 tablet (100 mg total) by mouth 3 (three) times daily.    Dispense:  270 tablet    Refill:  1    dose increase   valsartan (DIOVAN) 160 MG tablet    Sig: Take 1 tablet (160 mg total) by mouth daily.    Dispense:  90 tablet    Refill:  1    D/C PREVIOUS RX     Signed, Chilton Si, MD  01/10/2021 4:41 PM    [facility]

## 2021-01-10 NOTE — Assessment & Plan Note (Signed)
Continue atorvastatin

## 2021-01-10 NOTE — Research (Signed)
  Subject Name: Danielle Jordan met inclusion and exclusion criteria for the Virtual Care and Social Determinant Interventions for the management of hypertension trial.  The informed consent form, study requirements and expectations were reviewed with the subject by Dr. Oval Linsey and myself. The subject was given the opportunity to read the consent and ask questions. The subject verbalized understanding of the trial requirements.  All questions were addressed prior to the signing of the consent form. The subject agreed to participate in the trial and signed the informed consent. The informed consent was obtained prior to performance of any protocol-specific procedures for the subject.  A copy of the signed informed consent was given to the subject and a copy was placed in the subject's medical record.  Bridey Buczek was randomized to Group 1.

## 2021-01-10 NOTE — Assessment & Plan Note (Signed)
Hyperaldosteronism work-up once more medically stable and blood pressure is down enough to hold her spironolactone.

## 2021-01-10 NOTE — Patient Instructions (Signed)
Medication Instructions:  INCREASE YOUR HYDRALAZINE TO 100 MG THREE TIMES A DAY   START VALSARTAN 160 MG DAILY    Labwork: BMET/TSH IN 1 WEEK    Testing/Procedures: Your physician has recommended that you have a sleep study. This test records several body functions during sleep, including: brain activity, eye movement, oxygen and carbon dioxide blood levels, heart rate and rhythm, breathing rate and rhythm, the flow of air through your mouth and nose, snoring, body muscle movements, and chest and belly movement. THE OFFICE WILL CALL YOU ONCE INSURANCE HAS BEEN REVIEWED  IF YOU DO NOT HEAR IN 2 WEEKS PLEASE CALL TO FOLLOW UP    Follow-Up: 02/09/2021 1:30 PM WITH PHARM D AT [facility] OFFICE    You will receive a phone call from the PREP exercise and nutrition program to schedule an initial assessment.   Special Instructions:  MONITOR YOUR BLOOD PRESSURE TWICE A DAY WITH MACHINE GIVEN. LOG IN THE BOOK PROVIDED. BRING THE BOOK AND YOUR BLOOD PRESSURE MACHINE TO YOUR FOLLOW UP IN 1 MONTH   DASH Eating Plan DASH stands for "Dietary Approaches to Stop Hypertension." The DASH eating plan is a healthy eating plan that has been shown to reduce high blood pressure (hypertension). It may also reduce your risk for type 2 diabetes, heart disease, and stroke. The DASH eating plan may also help with weight loss. What are tips for following this plan?  General guidelines Avoid eating more than 2,300 mg (milligrams) of salt (sodium) a day. If you have hypertension, you may need to reduce your sodium intake to 1,500 mg a day. Limit alcohol intake to no more than 1 drink a day for nonpregnant women and 2 drinks a day for men. One drink equals 12 oz of beer, 5 oz of wine, or 1 oz of hard liquor. Work with your health care provider to maintain a healthy body weight or to lose weight. Ask what an ideal weight is for you. Get at least 30 minutes of exercise that causes your heart to beat faster (aerobic  exercise) most days of the week. Activities may include walking, swimming, or biking. Work with your health care provider or diet and nutrition specialist (dietitian) to adjust your eating plan to your individual calorie needs. Reading food labels  Check food labels for the amount of sodium per serving. Choose foods with less than 5 percent of the Daily Value of sodium. Generally, foods with less than 300 mg of sodium per serving fit into this eating plan. To find whole grains, look for the word "whole" as the first word in the ingredient list. Shopping Buy products labeled as "low-sodium" or "no salt added." Buy fresh foods. Avoid canned foods and premade or frozen meals. Cooking Avoid adding salt when cooking. Use salt-free seasonings or herbs instead of table salt or sea salt. Check with your health care provider or pharmacist before using salt substitutes. Do not fry foods. Cook foods using healthy methods such as baking, boiling, grilling, and broiling instead. Cook with heart-healthy oils, such as olive, canola, soybean, or sunflower oil. Meal planning Eat a balanced diet that includes: 5 or more servings of fruits and vegetables each day. At each meal, try to fill half of your plate with fruits and vegetables. Up to 6-8 servings of whole grains each day. Less than 6 oz of lean meat, poultry, or fish each day. A 3-oz serving of meat is about the same size as a deck of cards. One egg equals 1  oz. 2 servings of low-fat dairy each day. A serving of nuts, seeds, or beans 5 times each week. Heart-healthy fats. Healthy fats called Omega-3 fatty acids are found in foods such as flaxseeds and coldwater fish, like sardines, salmon, and mackerel. Limit how much you eat of the following: Canned or prepackaged foods. Food that is high in trans fat, such as fried foods. Food that is high in saturated fat, such as fatty meat. Sweets, desserts, sugary drinks, and other foods with added  sugar. Full-fat dairy products. Do not salt foods before eating. Try to eat at least 2 vegetarian meals each week. Eat more home-cooked food and less restaurant, buffet, and fast food. When eating at a restaurant, ask that your food be prepared with less salt or no salt, if possible. What foods are recommended? The items listed may not be a complete list. Talk with your dietitian about what dietary choices are best for you. Grains Whole-grain or whole-wheat bread. Whole-grain or whole-wheat pasta. Brown rice. Orpah Cobb. Bulgur. Whole-grain and low-sodium cereals. Pita bread. Low-fat, low-sodium crackers. Whole-wheat flour tortillas. Vegetables Fresh or frozen vegetables (raw, steamed, roasted, or grilled). Low-sodium or reduced-sodium tomato and vegetable juice. Low-sodium or reduced-sodium tomato sauce and tomato paste. Low-sodium or reduced-sodium canned vegetables. Fruits All fresh, dried, or frozen fruit. Canned fruit in natural juice (without added sugar). Meat and other protein foods Skinless chicken or [place]. Ground chicken or [place]. Pork with fat trimmed off. Fish and seafood. Egg whites. Dried beans, peas, or lentils. Unsalted nuts, nut butters, and seeds. Unsalted canned beans. Lean cuts of beef with fat trimmed off. Low-sodium, lean deli meat. Dairy Low-fat (1%) or fat-free (skim) milk. Fat-free, low-fat, or reduced-fat cheeses. Nonfat, low-sodium ricotta or cottage cheese. Low-fat or nonfat yogurt. Low-fat, low-sodium cheese. Fats and oils Soft margarine without trans fats. Vegetable oil. Low-fat, reduced-fat, or light mayonnaise and salad dressings (reduced-sodium). Canola, safflower, olive, soybean, and sunflower oils. Avocado. Seasoning and other foods Herbs. Spices. Seasoning mixes without salt. Unsalted popcorn and pretzels. Fat-free sweets. What foods are not recommended? The items listed may not be a complete list. Talk with your dietitian about what dietary choices  are best for you. Grains Baked goods made with fat, such as croissants, muffins, or some breads. Dry pasta or rice meal packs. Vegetables Creamed or fried vegetables. Vegetables in a cheese sauce. Regular canned vegetables (not low-sodium or reduced-sodium). Regular canned tomato sauce and paste (not low-sodium or reduced-sodium). Regular tomato and vegetable juice (not low-sodium or reduced-sodium). Rosita Fire. Olives. Fruits Canned fruit in a light or heavy syrup. Fried fruit. Fruit in cream or butter sauce. Meat and other protein foods Fatty cuts of meat. Ribs. Fried meat. Tomasa Blase. Sausage. Bologna and other processed lunch meats. Salami. Fatback. Hotdogs. Bratwurst. Salted nuts and seeds. Canned beans with added salt. Canned or smoked fish. Whole eggs or egg yolks. Chicken or [place] with skin. Dairy Whole or 2% milk, cream, and half-and-half. Whole or full-fat cream cheese. Whole-fat or sweetened yogurt. Full-fat cheese. Nondairy creamers. Whipped toppings. Processed cheese and cheese spreads. Fats and oils Butter. Stick margarine. Lard. Shortening. Ghee. Bacon fat. Tropical oils, such as coconut, palm kernel, or palm oil. Seasoning and other foods Salted popcorn and pretzels. Onion salt, garlic salt, seasoned salt, table salt, and sea salt. Worcestershire sauce. Tartar sauce. Barbecue sauce. Teriyaki sauce. Soy sauce, including reduced-sodium. Steak sauce. Canned and packaged gravies. Fish sauce. Oyster sauce. Cocktail sauce. Horseradish that you find on the shelf. Ketchup. Mustard. Meat flavorings  and tenderizers. Bouillon cubes. Hot sauce and Tabasco sauce. Premade or packaged marinades. Premade or packaged taco seasonings. Relishes. Regular salad dressings. Where to find more information: National Heart, Lung, and Blood Institute: PopSteam.is American Heart Association: www.heart.org Summary The DASH eating plan is a healthy eating plan that has been shown to reduce high blood pressure  (hypertension). It may also reduce your risk for type 2 diabetes, heart disease, and stroke. With the DASH eating plan, you should limit salt (sodium) intake to 2,300 mg a day. If you have hypertension, you may need to reduce your sodium intake to 1,500 mg a day. When on the DASH eating plan, aim to eat more fresh fruits and vegetables, whole grains, lean proteins, low-fat dairy, and heart-healthy fats. Work with your health care provider or diet and nutrition specialist (dietitian) to adjust your eating plan to your individual calorie needs. This information is not intended to replace advice given to you by your health care provider. Make sure you discuss any questions you have with your health care provider. Document Released: 01/19/2011 Document Revised: 01/12/2017 Document Reviewed: 01/24/2016 Elsevier Patient Education  2020 [employer].

## 2021-01-10 NOTE — Assessment & Plan Note (Signed)
Add ARB as above.  Check a BMP in a week.  She follows with nephrology.  Blood pressure control as above.

## 2021-01-10 NOTE — Assessment & Plan Note (Signed)
Blood pressure remains uncontrolled despite being on multiple agents.  She already had renal artery Dopplers.  She is on spironolactone and her blood pressure remains poorly controlled, making hyperaldosteronism less likely.  We will check renin and aldosterone levels with her blood pressures are better controlled and were able to hold her spironolactone.  For now, continue chlorthalidone, clonidine, hydralazine, labetalol, nifedipine, and spironolactone.  Add valsartan 160 mg daily.  She will track her blood pressures.  We will also refer her to the PR EP program to the [facility] in [place].  She will continue to limit her sodium intake and try to increase her exercise to at least 150 minutes weekly.  She is interested in enrolling in the [employer] remote patient monitoring program and participating in RPM study.

## 2021-01-11 ENCOUNTER — Telehealth: Payer: Self-pay | Admitting: *Deleted

## 2021-01-11 NOTE — Telephone Encounter (Signed)
Left message to return a call to discuss sleep study appointment details. 

## 2021-01-11 NOTE — Telephone Encounter (Signed)
-----   Message from Burnell Blanks, LPN sent at [phone]  6:15 PM EST ----- Hello all  Patient needs sleep study   Thanks  Juliette Alcide

## 2021-01-14 ENCOUNTER — Telehealth: Payer: Self-pay

## 2021-01-14 NOTE — Telephone Encounter (Signed)
Attempted to reach patient x 2 this am to discuss PREP referral.  Message sts not available.  Will retry at another date

## 2021-02-09 ENCOUNTER — Telehealth: Payer: Self-pay

## 2021-02-09 ENCOUNTER — Ambulatory Visit: Payer: Medicare Other

## 2021-02-09 NOTE — Progress Notes (Deleted)
02/09/2021 Addison Naegeli [place]ano Jun 28, 1950 [id number]   HPI:  Danielle Jordan is a 70 y.o. female patient of Dr Duke Salvia, with a PMH below who presents today for advanced hypertension clinic follow up.   She was seen by Dr. Duke Salvia last month, with a pressure of 172/99 and was agreeable to enroll in our [employer] project.  She was randomized to group 1.   Patient noted that she was first diagnosed with hypertension while in her 20's, but was well controlled for many years, until having her stroke in 2018  Past Medical History: hyperlipidemia 5/22 LDL 114 on   CKD 5/22 ACr 7.42, GFR 39  CVA 06/2016 - intracranial hemorrhage; setting of uncontrolled hypertension  DM2 5/22 A1c 8.4 on         Blood Pressure Goal:  130/80  Current Medications: valsartan 160 mg qd, chlorthalidone 25 mg qd, nifedipine XL 90 mg qd, labetalol 200 mg bid, hydralazine 100 mg tid, spironolactone 25 mg qd,   Family Hx:  Social Hx:   Diet:   Exercise:   Home BP readings:   Intolerances: lisinopril - cough  Labs:    Wt Readings from Last 3 Encounters:  01/10/21 191 lb 6.4 oz (86.8 kg)  11/09/20 187 lb (84.8 kg)  09/08/20 190 lb 12.8 oz (86.5 kg)   BP Readings from Last 3 Encounters:  01/10/21 (!) 172/99  11/09/20 (!) 142/100  09/08/20 (!) 140/98   Pulse Readings from Last 3 Encounters:  01/10/21 70  11/09/20 88  09/08/20 96    Current Outpatient Medications  Medication Sig Dispense Refill   acetaminophen (TYLENOL) 325 MG tablet Take 650 mg by mouth as needed.     aspirin EC 81 MG tablet Take 1 tablet (81 mg total) by mouth daily. 90 tablet 0   atorvastatin (LIPITOR) 80 MG tablet Take 1 tablet (80 mg total) by mouth daily. 90 tablet 2   chlorthalidone (HYGROTON) 25 MG tablet Take 1 tablet by mouth once daily 30 tablet 3   Cholecalciferol (VITAMIN D-3) 125 MCG (5000 UT) TABS Take 1 tablet by mouth daily.     cloNIDine (CATAPRES) 0.3 MG tablet Take 1 tablet (0.3 mg total) by mouth 3 (three) times  daily. 90 tablet 2   glipiZIDE (GLUCOTROL) 5 MG tablet Take 1 tablet (5 mg total) by mouth daily before breakfast. 30 tablet 3   hydrALAZINE (APRESOLINE) 100 MG tablet Take 1 tablet (100 mg total) by mouth 3 (three) times daily. 270 tablet 1   labetalol (NORMODYNE) 200 MG tablet Take 1 tablet (200 mg total) by mouth 2 (two) times daily. 180 tablet 1   NIFEdipine (PROCARDIA XL/NIFEDICAL-XL) 90 MG 24 hr tablet Take 90 mg by mouth daily.     spironolactone (ALDACTONE) 25 MG tablet Take 1 tablet by mouth once daily 30 tablet 3   valsartan (DIOVAN) 160 MG tablet Take 1 tablet (160 mg total) by mouth daily. 90 tablet 1   No current facility-administered medications for this visit.    Allergies  Allergen Reactions   Lisinopril Cough    Past Medical History:  Diagnosis Date   Hyperlipidemia    Hypertension    Resistant hypertension    Stroke (HCC)    2018   Vitamin D deficiency disease 12/31/2018    There were no vitals taken for this visit.  No problem-specific Assessment & Plan notes found for this encounter.   Phillips Hay PharmD CPP [facility] [address] [place], [place] [address] [phone]

## 2021-02-09 NOTE — Telephone Encounter (Signed)
Lmom for missed appt  

## 2021-02-11 ENCOUNTER — Other Ambulatory Visit: Payer: Self-pay

## 2021-02-11 ENCOUNTER — Ambulatory Visit: Payer: Medicare Other | Attending: Cardiovascular Disease | Admitting: Cardiovascular Disease

## 2021-02-11 DIAGNOSIS — I10 Essential (primary) hypertension: Secondary | ICD-10-CM | POA: Insufficient documentation

## 2021-02-11 DIAGNOSIS — R0683 Snoring: Secondary | ICD-10-CM | POA: Insufficient documentation

## 2021-02-11 DIAGNOSIS — I493 Ventricular premature depolarization: Secondary | ICD-10-CM | POA: Diagnosis not present

## 2021-02-11 DIAGNOSIS — G4733 Obstructive sleep apnea (adult) (pediatric): Secondary | ICD-10-CM | POA: Diagnosis not present

## 2021-02-11 DIAGNOSIS — R400 Somnolence: Secondary | ICD-10-CM | POA: Insufficient documentation

## 2021-02-25 ENCOUNTER — Encounter: Payer: Self-pay | Admitting: Cardiovascular Disease

## 2021-02-25 NOTE — Procedures (Signed)
[facility]       Patient Name: Danielle Jordan, Danielle Jordan Date: 02/11/2021 Gender: Female D.O.B: Oct 15, 1950 Age (years): 70 Referring Provider: Skeet Latch Height (inches): 62 Interpreting Physician: Shelva Majestic MD, ABSM Weight (lbs): 191 RPSGT: Peak, Robert BMI: 35 MRN: [id number] Neck Size: 15.00  CLINICAL INFORMATION Sleep Study Type: NPSG  Indication for sleep study: resistant hypeertension, snoring  Epworth Sleepiness Score: 2  SLEEP STUDY TECHNIQUE As per the AASM Manual for the Scoring of Sleep and Associated Events v2.3 (April 2016) with a hypopnea requiring 4% desaturations.  The channels recorded and monitored were frontal, central and occipital EEG, electrooculogram (EOG), submentalis EMG (chin), nasal and oral airflow, thoracic and abdominal wall motion, anterior tibialis EMG, snore microphone, electrocardiogram, and pulse oximetry.  MEDICATIONS acetaminophen (TYLENOL) 325 MG tablet aspirin EC 81 MG tablet atorvastatin (LIPITOR) 80 MG tablet chlorthalidone (HYGROTON) 25 MG tablet Cholecalciferol (VITAMIN D-3) 125 MCG (5000 UT) TABS cloNIDine (CATAPRES) 0.3 MG tablet glipiZIDE (GLUCOTROL) 5 MG tablet hydrALAZINE (APRESOLINE) 100 MG tablet labetalol (NORMODYNE) 200 MG tablet NIFEdipine (PROCARDIA XL/NIFEDICAL-XL) 90 MG 24 hr tablet spironolactone (ALDACTONE) 25 MG tablet valsartan (DIOVAN) 160 MG tablet Medications self-administered by patient taken the night of the study : N/A  SLEEP ARCHITECTURE The study was initiated at 9:31:58 PM and ended at 5:52:26 AM.  Sleep onset time was 64.3 minutes and the sleep efficiency was 62.8%. The total sleep time was 314.1 minutes.  Stage REM latency was 55.5 minutes.  The patient spent 6.37% of the night in stage N1 sleep, 81.22% in stage N2 sleep, 1.91% in stage N3 and 10.5% in REM.  Alpha intrusion was absent.  Supine sleep was 100.00%.  RESPIRATORY PARAMETERS The overall  apnea/hypopnea index (AHI) was 10.3 per hour. The respiratory disturbance index (RDI) was 13.8/h. There were 48 total apneas, including 45 obstructive, 3 central and 0 mixed apneas. There were 6 hypopneas and 18 RERAs.  The AHI during Stage REM sleep was 56.4 per hour.  AHI while supine was 10.3 per hour.  The mean oxygen saturation was 94.38%. The minimum SpO2 during sleep was 84.00%.  Moderate snoring was noted during this study.  CARDIAC DATA The 2 lead EKG demonstrated sinus rhythm. The mean heart rate was 56.62 beats per minute. Other EKG findings include: PVCs.  LEG MOVEMENT DATA The total PLMS were 0 with a resulting PLMS index of 0.00. Associated arousal with leg movement index was 0.0 .  IMPRESSIONS - Mild obstructive sleep apnea overall (AHI 10.3/h; RDI 13.8/h); however, sleep apnea was severe during REM sleep (AHI 56.4/h). - No significant central sleep apnea occurred during this study (CAI  0.6/h). - Mild oxygen desaturation to a nadir of 84%. - The patient snored with moderate snoring volume. - EKG findings include PVCs. - Clinically significant periodic limb movements did not occur during sleep. No significant associated arousals.  DIAGNOSIS - Obstructive Sleep Apnea (G47.33)  RECOMMENDATIONS - Therapeutic CPAP titration to determine optimal pressure required to alleviate sleep disordered breathing. If unable to obtain an in-lab titration can initate Auto-PAP with EPR of 3 at 7 - 20 cm of water. - Effort should be made to optimize nasal and oropharyngeal patency.  - Positional therapy avoiding supine position during sleep. - Avoid alcohol, sedatives and other CNS depressants that may worsen sleep apnea and disrupt normal  sleep architecture. - Sleep hygiene should be reviewed to assess factors that may improve sleep quality. - Weight management (BMI 35) and regular exercise should be initiated or continued if appropriate.  [Electronically signed] 02/25/2021 09:38  AM  Shelva Majestic MD, [facility], [place], American Board of Sleep Medicine   NPI: [id number]  [place] PH: [phone]   FX: [phone] [place]

## 2021-03-02 ENCOUNTER — Telehealth: Payer: Self-pay | Admitting: *Deleted

## 2021-03-02 NOTE — Telephone Encounter (Signed)
-----   Message from [name], MD sent at 02/25/2021  9:42 AM EST ----- Danielle Jordan please notify pt of results; recommend CPAP titration, can initiate Auto-PAP

## 2021-03-02 NOTE — Telephone Encounter (Signed)
Left message to return a call to discuss her sleep study results and recommendations. °

## 2021-03-03 LAB — BASIC METABOLIC PANEL
BUN/Creatinine Ratio: 18 (ref 12–28)
BUN: 22 mg/dL (ref 8–27)
CO2: 24 mmol/L (ref 20–29)
Calcium: 10.1 mg/dL (ref 8.7–10.3)
Chloride: 103 mmol/L (ref 96–106)
Creatinine, Ser: 1.25 mg/dL — ABNORMAL HIGH (ref 0.57–1.00)
Glucose: 149 mg/dL — ABNORMAL HIGH (ref 70–99)
Potassium: 4.4 mmol/L (ref 3.5–5.2)
Sodium: 142 mmol/L (ref 134–144)
eGFR: 47 mL/min/{1.73_m2} — ABNORMAL LOW (ref >59)

## 2021-03-03 LAB — TSH: TSH: 1.79 u[IU]/mL (ref 0.450–4.500)

## 2021-03-04 ENCOUNTER — Telehealth: Payer: Self-pay

## 2021-03-04 NOTE — Telephone Encounter (Signed)
LVMT [phone]   requesting call back to discuss PREP at [facility] Unable to LM on cell number

## 2021-03-08 ENCOUNTER — Ambulatory Visit (INDEPENDENT_AMBULATORY_CARE_PROVIDER_SITE_OTHER): Payer: Medicare Other | Admitting: Pharmacist Clinician (PhC)/ Clinical Pharmacy Specialist

## 2021-03-08 ENCOUNTER — Other Ambulatory Visit: Payer: Self-pay

## 2021-03-08 DIAGNOSIS — N1832 Chronic kidney disease, stage 3b: Secondary | ICD-10-CM

## 2021-03-08 DIAGNOSIS — E118 Type 2 diabetes mellitus with unspecified complications: Secondary | ICD-10-CM | POA: Diagnosis not present

## 2021-03-08 DIAGNOSIS — E782 Mixed hyperlipidemia: Secondary | ICD-10-CM | POA: Diagnosis not present

## 2021-03-08 DIAGNOSIS — I10 Essential (primary) hypertension: Secondary | ICD-10-CM

## 2021-03-08 DIAGNOSIS — E109 Type 1 diabetes mellitus without complications: Secondary | ICD-10-CM

## 2021-03-08 MED ORDER — VALSARTAN 160 MG PO TABS: 160.0000 mg | ORAL_TABLET | Freq: Every day | ORAL | 3 refills | Status: DC

## 2021-03-08 MED ORDER — ATORVASTATIN CALCIUM 80 MG PO TABS: 80.0000 mg | ORAL_TABLET | Freq: Every day | ORAL | 3 refills | Status: DC

## 2021-03-08 MED ORDER — LABETALOL HCL 200 MG PO TABS: 200.0000 mg | ORAL_TABLET | Freq: Two times a day (BID) | ORAL | 3 refills | Status: DC

## 2021-03-08 MED ORDER — HYDRALAZINE HCL 100 MG PO TABS: 100.0000 mg | ORAL_TABLET | Freq: Three times a day (TID) | ORAL | 3 refills | Status: DC

## 2021-03-08 MED ORDER — NIFEDIPINE ER OSMOTIC RELEASE 90 MG PO TB24: 90.0000 mg | ORAL_TABLET | Freq: Every day | ORAL | 3 refills | Status: DC

## 2021-03-08 MED ORDER — CLONIDINE HCL 0.3 MG PO TABS: 0.3000 mg | ORAL_TABLET | Freq: Three times a day (TID) | ORAL | 3 refills | Status: DC

## 2021-03-08 MED ORDER — GLIPIZIDE 5 MG PO TABS: 5.0000 mg | ORAL_TABLET | Freq: Every day | ORAL | 0 refills | Status: DC

## 2021-03-08 MED ORDER — CHLORTHALIDONE 25 MG PO TABS: 25.0000 mg | ORAL_TABLET | Freq: Every day | ORAL | 3 refills | Status: DC

## 2021-03-08 NOTE — Assessment & Plan Note (Signed)
Advised patient to continue atorvastatin 80 mg nightly and to include these in her pill planner. Will need to recheck lipids at some point as they have not been checked since increasing her atorvastatin to 80 mg (LDL 114 06/17/2020).

## 2021-03-08 NOTE — Assessment & Plan Note (Addendum)
Patients blood pressure in the office today was 170/96. Her daughter was with her and they stated she had not been able to refill some of her medications after her PCP passed away last year. She has not been able to get her chlorthalidone, spironolactone, and labetalol for a while. Restarted her chlorthalidone 25 mg daily and labetalol 200mg  BID and sent in refills for medications. She will return for follow up on February 7th and will get labs done then as well. Will restart spironolactone if labs are fine and if blood pressure can tolerate it. Gave the patient a 7-day pill planner and explained how to use it and how to separate her pills. Wrote down her regimen for her for morning, noon, and evening. Her morning medications include: valsartan, chlorthalidone, clonidine, hydralazine, and labetalol. Her noon medications are: clonidine and hydralazine. Her evening medications are: nifedipine, clonidine, hydralazine, and labetalol. Asked her to bring her pill planner, home monitor, and home readings with her next visit. Provided her with information about getting assistance to find a PCP. Also notified her that the PREP exercise group had tried getting in touch with her twice and to check messages to get scheduled with them.  Recent OSA diagnosis, not currently on CPAP

## 2021-03-08 NOTE — Patient Instructions (Signed)
Return for a a follow up appointment February 7 at 11:30 am  Check your blood pressure at home twice daily and keep record of the readings.  Take your BP meds as follows:  AM:  valsartan 160 mg, chlorthalidone 25 mg, hydralazine 100 mg, labetalol 200 mg, clonidine 0.3 mg  Noon: hydralazine 100 mg, clonidine 0.3 mg  PM: nifedipine 90 mg, hydralazine 100 mg, labetalol 200 mg, clonidine 0.3 mg  Bring all of your meds, your BP cuff and your record of home blood pressures to your next appointment.  Exercise as youre able, try to walk approximately 30 minutes per day.  Keep salt intake to a minimum, especially watch canned and prepared boxed foods.  Eat more fresh fruits and vegetables and fewer canned items.  Avoid eating in fast food restaurants.    HOW TO TAKE YOUR BLOOD PRESSURE: Rest 5 minutes before taking your blood pressure.  Dont smoke or drink caffeinated beverages for at least 30 minutes before. Take your blood pressure before (not after) you eat. Sit comfortably with your back supported and both feet on the floor (dont cross your legs). Elevate your arm to heart level on a table or a desk. Use the proper sized cuff. It should fit smoothly and snugly around your bare upper arm. There should be enough room to slip a fingertip under the cuff. The bottom edge of the cuff should be 1 inch above the crease of the elbow. Ideally, take 3 measurements at one sitting and record the average.

## 2021-03-08 NOTE — Assessment & Plan Note (Signed)
Refilled glipizide 5 mg tablets for her today until she can get a PCP. She had not been taking this either since her PCP passed. Does not check blood sugars at home and last A1c was 8.4 in 06/2020.

## 2021-03-08 NOTE — Progress Notes (Signed)
03/08/2021 Danielle Jordan 08-21-1950 [id number]   HPI: Danielle Jordan is a 71 y/o female with a history of hypertension, hyperlipidemia, CKD, and stroke here today with her daughter, for hypertension follow-up. She was last seen by Dr. Oval Linsey on 01/10/2021 after being referred to the advanced hypertension clinic Dr. Leonides Sake. She has struggled with resistant hypertension and has had events of chest pain. She had a nuclear stress test in 06/2020 and renal artery dopplers in 07/2020 that were normal. She was first diagnosed with hypertension in her 45s. It used to be well controlled until she had her stroke and she has struggled since then. She checks her blood pressure at home but does not think her machine is accurate as it shows a big differential between the right and left arms when she checks. Her blood pressure on 01/10/2021 with Dr. Oval Linsey was 172/99. At her last visit, valsartan 164m daily was added to her regimen.   Says she is feeling fine, no side effects. Very pleasant and sweet. Denies shortness of breath, or dizziness. Primary doctor has passed away and she has been having troubles getting refills for some of her blood pressure medications. She has been trying to find a new PCP to get in with. Has not been able to get refills for chlorthalidone, spironolactone, and labetalol for some time.   PMH:  Resistant Hypertension - valsartan 165mdaily, hydralazine 10063mID, nifedipine 90 mg daily, chlorthalidone 25 mg daily, spironolactone 25 mg daily, labetalol 200m69mD, clonidine 0.3 mg TID Chronic kidney disease (Scr 1.25, eGFR 47 03/02/2021) Hyperlipidemia - atorvastatin 80 mg (LDL 114 06/17/2020) Abdominal aortic aneurysm without rupture   Blood Pressure Goal < 130/80   Current Medications  valsartan 160mg17mly - morning hydralazine 100mg 30m- morning, noon, night nifedipine 90 mg daily - evening clonidine 0.3 mg TID - morning, noon, night   Previous Antihypertensives: Lisinopril - cough    Family Hx: Mom and dad had high blood pressure; does not think brother had heart problems; one daughter that is healthy, one son with high blood pressure, two other sons with no health problems; all grandchildren still young and healthy  Social Hx:  No tobacco use, no alcohol use, coffees occasionally, no sodas  Diet: Oatmeal, greens, fruits, chicken or fish, will eat out sometimes (fast food/sit down restaurants)  Snacks - chips, popcorn (no added salt to popcorn); does not really eat sweets  Exercise: No formal exercise, but does walk around the house   At Home Readings for January: Morning Readings: Average 142/92, Ranges 102-158/67-114, HR 73 (24) Evening Readings: Average 145/92, Ranges 128-170/83-110, HR 71 (21)  In Clinic Reading: 170/96  Intolerances: Lisinopril - cough   Labs: 03/02/2021: Na 142, K 4.4, BUN 22, Scr 1.25, eGFR 47, Glu 149  03/02/2021: TSH 1.790 06/17/2020: A1c 8.4 06/17/2020: TC 175, TG 167, HDL 33, LDL 114   Wt Readings from Last 3 Encounters:  03/08/21 188 lb (85.3 kg)  01/10/21 191 lb 6.4 oz (86.8 kg)  11/09/20 187 lb (84.8 kg)   BP Readings from Last 3 Encounters:  03/08/21 (!) 170/96  01/10/21 (!) 172/99  11/09/20 (!) 142/100   Pulse Readings from Last 3 Encounters:  03/08/21 75  01/10/21 70  11/09/20 88    Current Outpatient Medications  Medication Sig Dispense Refill   acetaminophen (TYLENOL) 325 MG tablet Take 650 mg by mouth as needed.     Cholecalciferol (VITAMIN D-3) 125 MCG (5000 UT) TABS Take 1 tablet by mouth  daily.     spironolactone (ALDACTONE) 25 MG tablet Take 1 tablet by mouth once daily 30 tablet 3   aspirin EC 81 MG tablet Take 1 tablet (81 mg total) by mouth daily. (Patient not taking: Reported on 03/08/2021) 90 tablet 0   atorvastatin (LIPITOR) 80 MG tablet Take 1 tablet (80 mg total) by mouth daily. 90 tablet 3   chlorthalidone (HYGROTON) 25 MG tablet Take 1 tablet (25 mg total) by mouth daily. 90 tablet 3   cloNIDine  (CATAPRES) 0.3 MG tablet Take 1 tablet (0.3 mg total) by mouth 3 (three) times daily. 90 tablet 3   glipiZIDE (GLUCOTROL) 5 MG tablet Take 1 tablet (5 mg total) by mouth daily before breakfast. 90 tablet 0   hydrALAZINE (APRESOLINE) 100 MG tablet Take 1 tablet (100 mg total) by mouth 3 (three) times daily. 270 tablet 3   labetalol (NORMODYNE) 200 MG tablet Take 1 tablet (200 mg total) by mouth 2 (two) times daily. 180 tablet 3   NIFEdipine (PROCARDIA XL/NIFEDICAL-XL) 90 MG 24 hr tablet Take 1 tablet (90 mg total) by mouth daily. 90 tablet 3   valsartan (DIOVAN) 160 MG tablet Take 1 tablet (160 mg total) by mouth daily. 90 tablet 3   No current facility-administered medications for this visit.    Allergies  Allergen Reactions   Lisinopril Cough    Past Medical History:  Diagnosis Date   Hyperlipidemia    Hypertension    Resistant hypertension    Stroke ([place])    2018   Vitamin D deficiency disease 12/31/2018    Blood pressure (!) 170/96, pulse 75, resp. rate 15, height 5' 3"  (1.6 m), weight 188 lb (85.3 kg), SpO2 97 %.  Resistant hypertension Patients blood pressure in the office today was 170/96. Her daughter was with her and they stated she had not been able to refill some of her medications after her PCP passed away last year. She has not been able to get her chlorthalidone, spironolactone, and labetalol for a while. Restarted her chlorthalidone 25 mg daily and labetalol 2108m BID and sent in refills for medications. She will return for follow up on February 7th and will get labs done then as well. Will restart spironolactone if labs are fine and if blood pressure can tolerate it. Gave the patient a 7-day pill planner and explained how to use it and how to separate her pills. Wrote down her regimen for her for morning, noon, and evening. Her morning medications include: valsartan, chlorthalidone, clonidine, hydralazine, and labetalol. Her noon medications are: clonidine and hydralazine.  Her evening medications are: nifedipine, clonidine, hydralazine, and labetalol. Asked her to bring her pill planner, home monitor, and home readings with her next visit. Provided her with information about getting assistance to find a PCP. Also notified her that the PREP exercise group had tried getting in touch with her twice and to check messages to get scheduled with them.  Recent OSA diagnosis, not currently on CPAP  Hyperlipidemia Advised patient to continue atorvastatin 80 mg nightly and to include these in her pill planner. Will need to recheck lipids at some point as they have not been checked since increasing her atorvastatin to 80 mg (LDL 114 06/17/2020).   Type 2 diabetes mellitus with complication, without long-term current use of insulin (HCC) Refilled glipizide 5 mg tablets for her today until she can get a PCP. She had not been taking this either since her PCP passed. Does not check blood sugars at home  and last A1c was 8.4 in 06/2020.   CKD (chronic kidney disease) On ARB and is followed by nephrology. Planning to check BMP in two weeks.   Glee Arvin, PharmD Student [employer], Class of 2023  I was with patient and student throughout entire appointment and agree with above assessment and plan.    Tommy Medal PharmD CPP [facility] [address] [place], [place] [address] [phone]

## 2021-03-08 NOTE — Assessment & Plan Note (Signed)
On ARB and is followed by nephrology. Planning to check BMP in two weeks.

## 2021-03-09 ENCOUNTER — Telehealth: Payer: Self-pay

## 2021-03-09 NOTE — Telephone Encounter (Signed)
Call from patient's daughter about PREP class  Explained program to pt's daughter and date/time of Twilight class Pt is interested in participating Intake scheduled for 03/10/21 at 130p at [facility]

## 2021-03-10 NOTE — Progress Notes (Signed)
[facility] PREP Evaluation  Patient Details  Name: Blen Herter MRN: [id number] Date of Birth: [birth year] Age: 71 y.o. PCP: Pcp, No  Vitals:   03/10/21 1430  BP: 120/82  Pulse: (!) 58  SpO2: 97%  Weight: 193 lb 12.8 oz (87.9 kg)     [facility] Eval - 03/10/21 1700       [facility] "PREP" Location   [facility] "PREP" Location [place]      Referral    Referring Provider Oval Linsey    Reason for referral High Cholesterol;Hypertension;Inactivity;Obesitity/Overweight;Diabetes    Program Start Date 03/15/21   T/TH 2p-315p x 12 wks     Measurement   Waist Circumference 41 inches   over sweatshirt   Hip Circumference 43.5 inches   over jeans and sweatshirt   Body fat 36.9 percent      Information for Trainer   Goals gain strength on left side, move around better    Current Exercise none    Orthopedic Concerns L side weakness s/p stroke    Pertinent Medical History DM, Stroke 2018, HTN, Inc lipids, CKD, OSA    Current Barriers none    Restrictions/Precautions --   encouraged to eat before class   Medications that affect exercise Beta blocker;Medication causing dizziness/drowsiness      Timed Up and Go (TUGS)   Timed Up and Go Moderate risk 10-12 seconds   able exit chair easily but left arm no swing     Mobility and Daily Activities   I find it easy to walk up or down two or more flights of stairs. 4    I have no trouble taking out the trash. 4    I do housework such as vacuuming and dusting on my own without difficulty. 3    I can easily lift a gallon of milk (8lbs). 4    I can easily walk a mile. 1    I have no trouble reaching into high cupboards or reaching down to pick up something from the floor. 3    I do not have trouble doing out-door work such as Armed forces logistics/support/administrative officer, raking leaves, or gardening. 3      Mobility and Daily Activities   I feel younger than my age. 1    I feel independent. 4    I feel energetic. 4    I live an active life.  1    I feel strong. 1    I feel  healthy. 1    I feel active as other people my age. 1      How fit and strong are you.   Fit and Strong Total Score 35            Past Medical History:  Diagnosis Date   Hyperlipidemia    Hypertension    Resistant hypertension    Stroke ([place])    2018   Vitamin D deficiency disease 12/31/2018   Past Surgical History:  Procedure Laterality Date   CESAREAN SECTION     Social History   Tobacco Use  Smoking Status Never  Smokeless Tobacco Never    Barnett Hatter 03/10/2021, 5:30 PM

## 2021-03-16 NOTE — Telephone Encounter (Signed)
Left message to return a call to me. 

## 2021-03-17 NOTE — Progress Notes (Signed)
[facility] PREP Weekly Session  Patient Details  Name: Danielle Jordan MRN: [id number] Date of Birth: 1950/04/06 Age: 71 y.o. PCP: Pcp, No  There were no vitals filed for this visit.   [facility] Weekly seesion - 03/17/21 1600       [facility] "PREP" Location   [facility]      Weekly Session   Topic Discussed Goal setting and welcome to the program   fit test completed   Classes attended to date 2             Bonnye Fava 03/17/2021, 4:24 PM

## 2021-03-22 ENCOUNTER — Ambulatory Visit (INDEPENDENT_AMBULATORY_CARE_PROVIDER_SITE_OTHER): Payer: Medicare Other | Admitting: Pharmacist Clinician (PhC)/ Clinical Pharmacy Specialist

## 2021-03-22 ENCOUNTER — Other Ambulatory Visit: Payer: Self-pay

## 2021-03-22 VITALS — BP 120/80 | HR 57 | Resp 17 | Ht 62.0 in | Wt 188.0 lb

## 2021-03-22 DIAGNOSIS — I10 Essential (primary) hypertension: Secondary | ICD-10-CM | POA: Diagnosis not present

## 2021-03-22 DIAGNOSIS — I1A0 Resistant hypertension: Secondary | ICD-10-CM

## 2021-03-22 MED ORDER — SPIRONOLACTONE 25 MG PO TABS: 25.0000 mg | ORAL_TABLET | Freq: Every day | ORAL | 3 refills | Status: DC

## 2021-03-22 NOTE — Patient Instructions (Signed)
Return for a a follow up appointment Wednesday March 15 at 11 am  Go to the lab today to check kidney function.   Take your BP meds as follows:  Cut clonidine from 1 tablet three times daily to 1/2 tablet three times daily for 1 week.  Then cut to 1/2 tablet twice daily for 1 week, then 1 daily for 1 week, then stop.  Move nifedipine to evenings    AM: valsartan, chlorthalidone, hydralazine, labetalol  Noon: hydralazine  PM: spironolactone, nifedipine, hydralazine, labetalol  Bring all of your meds, your BP cuff and your record of home blood pressures to your next appointment.  Exercise as youre able, try to walk approximately 30 minutes per day.  Keep salt intake to a minimum, especially watch canned and prepared boxed foods.  Eat more fresh fruits and vegetables and fewer canned items.  Avoid eating in fast food restaurants.    HOW TO TAKE YOUR BLOOD PRESSURE: Rest 5 minutes before taking your blood pressure.  Dont smoke or drink caffeinated beverages for at least 30 minutes before. Take your blood pressure before (not after) you eat. Sit comfortably with your back supported and both feet on the floor (dont cross your legs). Elevate your arm to heart level on a table or a desk. Use the proper sized cuff. It should fit smoothly and snugly around your bare upper arm. There should be enough room to slip a fingertip under the cuff. The bottom edge of the cuff should be 1 inch above the crease of the elbow. Ideally, take 3 measurements at one sitting and record the average.

## 2021-03-22 NOTE — Progress Notes (Signed)
03/25/2021 Lacy Reidel 01-11-1951 [id number]   HPI: Danielle Jordan is a 71 y/o female with a history of hypertension, hyperlipidemia, CKD, and stroke here today with her daughter, for hypertension follow-up. She was last seen by Dr. Oval Linsey on 01/10/2021 after being referred to the advanced hypertension clinic by Levell July. She has struggled with resistant hypertension and has had events of chest pain. She had a nuclear stress test in 06/2020 and renal artery dopplers in 07/2020 that were normal. She was first diagnosed with hypertension in her 83s. It used to be well controlled until she had her stroke and she has struggled since then. She checks her blood pressure at home but does not think her machine is accurate as it shows a big differential between the right and left arms when she checks. Her blood pressure on 01/10/2021 with Dr. Oval Linsey was 172/99. At her last visit, valsartan 160m daily was added to her regimen.  We saw her about 2 weeks ago, at which time her pressure was still elevated, and after questioning her closely, discovered that she was not taking 3 of her 7 BP medications.  Her PCP had recently passed away and she was unable to get meds refilled by that office.   We started her back on chlorthalidone and labetalol, holding off on the spironolactone until we could see how she was doing.    Today she returns for follow up.  Has been taking 6 medications each morning for BP and notes that she feels "off kilter" for awhile after that.  Blood pressure readings are better, although still fluctuating quite a bit.     PMH:  Resistant Hypertension - valsartan 1622mdaily, hydralazine 10046mID, nifedipine 90 mg daily, chlorthalidone 25 mg daily, labetalol 200m29mD, clonidine 0.3 mg TID Chronic kidney disease (Scr 1.25, eGFR 47 03/02/2021) Hyperlipidemia - atorvastatin 80 mg (LDL 114 06/17/2020) Abdominal aortic aneurysm without rupture   Blood Pressure Goal < 130/80   Current Medications   valsartan 160mg68mly - morning hydralazine 100mg 45m- morning, noon, night nifedipine 90 mg daily - evening chlorthalidone 25 mg daily - morning labetalol 200mg B71m morning, night clonidine 0.3 mg TID - morning, noon, night   Previous Antihypertensives: Lisinopril - cough   Family Hx: Mom and dad had high blood pressure; does not think brother had heart problems; one daughter that is healthy, one son with high blood pressure, two other sons with no health problems; all grandchildren still young and healthy  Social Hx:  No tobacco use, no alcohol use, coffees occasionally, no sodas  Diet: Oatmeal, greens, fruits, chicken or fish, will eat out sometimes (fast food/sit down restaurants)  Snacks - chips, popcorn (no added salt to popcorn); does not really eat sweets  Exercise: No formal exercise, but does walk around the house   At Home Readings for January: Morning Readings: Average 142/92, Ranges 102-158/67-114, HR 73 (24) Evening Readings: Average 145/92, Ranges 128-170/83-110, HR 71 (21)  Intolerances: Lisinopril - cough   Labs: 03/02/2021: Na 142, K 4.4, BUN 22, Scr 1.25, eGFR 47, Glu 149  03/02/2021: TSH 1.790 06/17/2020: A1c 8.4 06/17/2020: TC 175, TG 167, HDL 33, LDL 114   Wt Readings from Last 3 Encounters:  03/22/21 188 lb (85.3 kg)  03/10/21 193 lb 12.8 oz (87.9 kg)  03/08/21 188 lb (85.3 kg)   BP Readings from Last 3 Encounters:  03/24/21 (!) 180/100  03/22/21 120/80  03/10/21 120/82   Pulse Readings from Last 3  Encounters:  03/22/21 (!) 57  03/10/21 (!) 58  03/08/21 75    Current Outpatient Medications  Medication Sig Dispense Refill   acetaminophen (TYLENOL) 325 MG tablet Take 650 mg by mouth as needed.     aspirin EC 81 MG tablet Take 1 tablet (81 mg total) by mouth daily. 90 tablet 0   atorvastatin (LIPITOR) 80 MG tablet Take 1 tablet (80 mg total) by mouth daily. 90 tablet 3   chlorthalidone (HYGROTON) 25 MG tablet Take 1 tablet (25 mg total) by  mouth daily. 90 tablet 3   Cholecalciferol (VITAMIN D-3) 125 MCG (5000 UT) TABS Take 1 tablet by mouth daily.     cloNIDine (CATAPRES) 0.3 MG tablet Take 1 tablet (0.3 mg total) by mouth 3 (three) times daily. 90 tablet 3   glipiZIDE (GLUCOTROL) 5 MG tablet Take 1 tablet (5 mg total) by mouth daily before breakfast. 90 tablet 0   hydrALAZINE (APRESOLINE) 100 MG tablet Take 1 tablet (100 mg total) by mouth 3 (three) times daily. 270 tablet 3   labetalol (NORMODYNE) 200 MG tablet Take 1 tablet (200 mg total) by mouth 2 (two) times daily. 180 tablet 3   NIFEdipine (PROCARDIA XL/NIFEDICAL-XL) 90 MG 24 hr tablet Take 1 tablet (90 mg total) by mouth daily. 90 tablet 3   valsartan (DIOVAN) 160 MG tablet Take 1 tablet (160 mg total) by mouth daily. 90 tablet 3   spironolactone (ALDACTONE) 25 MG tablet Take 1 tablet (25 mg total) by mouth daily. 30 tablet 3   No current facility-administered medications for this visit.    Allergies  Allergen Reactions   Lisinopril Cough    Past Medical History:  Diagnosis Date   Hyperlipidemia    Hypertension    Resistant hypertension    Stroke ([place])    2018   Vitamin D deficiency disease 12/31/2018    Blood pressure 120/80, pulse (!) 57, resp. rate 17, height _0  (1.575 m), weight 188 lb (85.3 kg), SpO2 98 %.  Resistant hypertension Patient with resistant hypertension, doing better now back on 6 of her previously 7 prescribed medications.  She has been taking all the medications each morning, which is probably causing a bit of hypotension, as she feels off kilter.  Will start back on the spironolactone, but wean off clonidine due to dry mouth/eyes.  Have asked that she divide medications to get better daytime balance as well.  For clonidine, she will cut to 1/2 tablet tid x 1 week, the 1/2 bid x 1 week, 1/2 qd x 1 week then discontinue.  She will also take the spironolactone and nifedipine at night rather than in the morning. Hopefully this better balance  will control BP and ease up on the possible BP lows she is getting in the mornings.  She should continue with home BP readings and I will see her back in a month for follow up.    Tommy Medal PharmD CPP [facility] [address] [place], [place] [address] [phone]

## 2021-03-23 ENCOUNTER — Other Ambulatory Visit: Payer: Self-pay | Admitting: Cardiovascular Disease

## 2021-03-23 DIAGNOSIS — I1A0 Resistant hypertension: Secondary | ICD-10-CM

## 2021-03-23 DIAGNOSIS — G4733 Obstructive sleep apnea (adult) (pediatric): Secondary | ICD-10-CM

## 2021-03-23 DIAGNOSIS — I10 Essential (primary) hypertension: Secondary | ICD-10-CM

## 2021-03-23 LAB — BASIC METABOLIC PANEL
BUN/Creatinine Ratio: 17 (ref 12–28)
BUN: 22 mg/dL (ref 8–27)
CO2: 25 mmol/L (ref 20–29)
Calcium: 9.4 mg/dL (ref 8.7–10.3)
Chloride: 101 mmol/L (ref 96–106)
Creatinine, Ser: 1.3 mg/dL — ABNORMAL HIGH (ref 0.57–1.00)
Glucose: 172 mg/dL — ABNORMAL HIGH (ref 70–99)
Potassium: 4.4 mmol/L (ref 3.5–5.2)
Sodium: 141 mmol/L (ref 134–144)
eGFR: 45 mL/min/{1.73_m2} — ABNORMAL LOW (ref >59)

## 2021-03-23 NOTE — Telephone Encounter (Signed)
Patient's daughter notified of sleep study results and recommendations. She agrees to proceed with CPAP titration. She will discuss the findings with her mom.

## 2021-03-24 ENCOUNTER — Ambulatory Visit: Payer: Medicare Other

## 2021-03-24 NOTE — Progress Notes (Signed)
Pt reported BP has been running high since some meds were changed.  Sts she took clonidine and hydralzaine this morning.  No headache. Gait a bit more unstable but denies any other issues.  Was able to exercise without issue. Doesn't use mychart so will send message to MD office. Not sure how often she is tracking BP

## 2021-03-25 ENCOUNTER — Encounter: Payer: Self-pay | Admitting: Pharmacist Clinician (PhC)/ Clinical Pharmacy Specialist

## 2021-03-25 NOTE — Assessment & Plan Note (Signed)
Patient with resistant hypertension, doing better now back on 6 of her previously 7 prescribed medications.  She has been taking all the medications each morning, which is probably causing a bit of hypotension, as she feels off kilter.  Will start back on the spironolactone, but wean off clonidine due to dry mouth/eyes.  Have asked that she divide medications to get better daytime balance as well.  For clonidine, she will cut to 1/2 tablet tid x 1 week, the 1/2 bid x 1 week, 1/2 qd x 1 week then discontinue.  She will also take the spironolactone and nifedipine at night rather than in the morning. Hopefully this better balance will control BP and ease up on the possible BP lows she is getting in the mornings.  She should continue with home BP readings and I will see her back in a month for follow up.

## 2021-03-30 NOTE — Progress Notes (Signed)
[facility] PREP Weekly Session  Patient Details  Name: Danielle Jordan MRN: [id number] Date of Birth: 11-14-1950 Age: 71 y.o. PCP: Pcp, No  Vitals:   03/29/21 1400  Weight: 193 lb 12.8 oz (87.9 kg)     [facility] Weekly seesion - 03/30/21 0900       [facility] "PREP" Location   [facility]P" [facility]      Weekly Session   Topic Discussed Healthy eating tips    Minutes exercised this week 80 minutes    Classes attended to date 4            Class held on 03/29/21 Bonnye Fava 03/30/2021, 9:22 AM

## 2021-04-05 NOTE — Progress Notes (Signed)
[facility] PREP Weekly Session  Patient Details  Name: Danielle Jordan MRN: [id number] Date of Birth: [birth year] Age: 71 y.o. PCP: Pcp, No  Vitals:   04/05/21 1400  Weight: 182 lb (82.6 kg)     [facility] Weekly seesion - 04/05/21 1700       [facility] "PREP" Location   [facility] "PREP" Location [place]      Weekly Session   Topic Discussed Health habits    Minutes exercised this week 160 minutes    Classes attended to date [place] 04/05/2021, 5:17 PM

## 2021-04-06 ENCOUNTER — Telehealth (HOSPITAL_BASED_OUTPATIENT_CLINIC_OR_DEPARTMENT_OTHER): Payer: Self-pay | Admitting: Cardiovascular Disease

## 2021-04-06 DIAGNOSIS — I10 Essential (primary) hypertension: Secondary | ICD-10-CM

## 2021-04-06 NOTE — Telephone Encounter (Signed)
New Message:     Patient's daughter is calling to check on the status of paperwork that was faxed on 03-10-21 from her job. They said they stii have not received them back.

## 2021-04-06 NOTE — Telephone Encounter (Signed)
Follow up on patient paperwork!

## 2021-04-07 NOTE — Telephone Encounter (Signed)
No papers received at [facility] location   Left message to call back to discuss exactly where papers were faxed and to which provider. Patient sees Dr Duke Salvia in [facility]

## 2021-04-08 NOTE — Telephone Encounter (Addendum)
Spoke with daughter and advised forms received She is requesting time off for appointments and when patient has episodes of low blood pressure  Yesterday had to stay home with her secondary to blood pressure being 100/60 and "she felt bad and was dizzy"  Daughter said there was one low reading last week but did not feel as bad nor did she have the reading.  Advised daughter to continue to monitor and if she has more episodes of low blood pressure to reach out with readings    Chilton Si, MD to Me    4:35 PM  Recommend reducing hydralazine to 50mg .  OK with giving her a note.   Left message to call back

## 2021-04-11 MED ORDER — HYDRALAZINE HCL 50 MG PO TABS: 50.0000 mg | ORAL_TABLET | Freq: Three times a day (TID) | ORAL | 3 refills | Status: DC

## 2021-04-11 NOTE — Telephone Encounter (Signed)
Advised daughter and updated Rx  Papers faxed, confirmation received

## 2021-04-11 NOTE — Telephone Encounter (Signed)
Daughter is returning call. Please advise  

## 2021-04-11 NOTE — Telephone Encounter (Signed)
Hey looks like you were helping this patient

## 2021-04-11 NOTE — Telephone Encounter (Signed)
Left message to call back  

## 2021-04-11 NOTE — Telephone Encounter (Signed)
Pt returning your phone call... please advise  

## 2021-04-27 ENCOUNTER — Other Ambulatory Visit: Payer: Self-pay

## 2021-04-27 ENCOUNTER — Encounter: Payer: Self-pay | Admitting: Pharmacist

## 2021-04-27 ENCOUNTER — Ambulatory Visit (INDEPENDENT_AMBULATORY_CARE_PROVIDER_SITE_OTHER): Payer: Medicare Other | Admitting: Pharmacist

## 2021-04-27 VITALS — BP 136/90 | HR 78 | Resp 17 | Ht 62.0 in | Wt 187.2 lb

## 2021-04-27 DIAGNOSIS — I10 Essential (primary) hypertension: Secondary | ICD-10-CM | POA: Diagnosis not present

## 2021-04-27 MED ORDER — HYDRALAZINE HCL 25 MG PO TABS: 25.0000 mg | ORAL_TABLET | Freq: Three times a day (TID) | ORAL | 1 refills | Status: DC

## 2021-04-27 NOTE — Patient Instructions (Addendum)
It was nice meeting you today ? ?We will discontinue your hydralazine at this point. ? ?Please continue the rest of the pills as scheduled ? ?AM:  ?Valsartan  ?Chlorthalidone  ?Labetalol ? ? PM:  ?Spironolactone   ?Nifedipine  ?Labetalol  ? ?Please continue to check your blood pressure and we will check you back in 1 month ? ?Please call or message with any questions ? ?Karren Cobble, PharmD, BCACP, [place], CPP ?[place], [address] ?[place], [place], [address] ?Phone: [phone], Fax: [phone]  ? ?

## 2021-04-27 NOTE — Progress Notes (Signed)
[facility] PREP Weekly Session ? ?Patient Details  ?Name: Danielle Jordan ?MRN: [id number] ?Date of Birth: Dec 06, 1950 ?Age: 71 y.o. ?PCP: Pcp, No ? ?Vitals:  ? 04/26/21 1400  ?Weight: 177 lb (80.3 kg)  ? ? ? [facility] Weekly seesion - 04/27/21 1500   ? ?  ? [facility] "PREP" Location  ? [facility] [place]   ?  ? Weekly Session  ? Topic Discussed Stress management and problem solving   Progressive relaxation meditation  ? Classes attended to date 18   ? ?  ?  ? ?  ? ? ?Barnett Hatter ?04/27/2021, 3:51 PM ? ? ?

## 2021-04-27 NOTE — Progress Notes (Signed)
Patient ID: Danielle Jordan                 DOB: 08/06/1950                      MRN: [id number] ? ? ? ? ?HPI: ?Danielle Jordan is a 71 y.o. female referred by Dr. Duke Salvia to HTN clinic. PMH is significant for HTN, AAA, T2DM, CKD, and TIA. This is patient's second PharmD visit. ? ?At last visit with PharmD, it was revealed patient was taking all of her antihypertensives in the morning and feeling poorly afterwards.  A schedule was made for her to split medications in morning, afternoon and evening.  Patient called back a few eeks later and reported her blood pressure decreased to 100/60 and she was dizzy.  Hydralazine was reduced to 50mg  TID. ? ?Patient presents today in good spirits with her daughter. Reports she feels well except still reports feeling poorly in the morning after her medications.  Brought BP log.  AM readings ranging from 97/63 to 157/97.  Evening readings ranging from 101/59 to 146/91. ? ?Daughter reports patient takes BP when she wakes up and then takes her medications.  Daughter has noticed patient continues to be fatigued and dizzy about an hour later and is concerned she may fall down. ? ?Current HTN meds:  ? AM:  ?Valsartan 160mg   ?Chlorthalidone 25 mg  ?Hydralazine 50 mg ?Labetalol 200 mg  ? Noon:  ?Hydralazine 50mg  ? PM:  ?Spironolactone 25mg   ?Nifedipine 90mg  ?Hydralazine 50mg   ?Labetalol 200mg  ? ?BP goal: <130/80 ? ?Wt Readings from Last 3 Encounters:  ?04/05/21 182 lb (82.6 kg)  ?03/29/21 193 lb 12.8 oz (87.9 kg)  ?03/22/21 188 lb (85.3 kg)  ? ?BP Readings from Last 3 Encounters:  ?03/24/21 (!) 180/100  ?03/22/21 120/80  ?03/10/21 120/82  ? ?Pulse Readings from Last 3 Encounters:  ?03/22/21 (!) 57  ?03/10/21 (!) 58  ?03/08/21 75  ? ? ?Renal function: ?CrCl cannot be calculated (Patient's most recent lab result is older than the maximum 21 days allowed.). ? ?Past Medical History:  ?Diagnosis Date  ? Hyperlipidemia   ? Hypertension   ? Resistant hypertension   ? Stroke [facility])   ? 2018  ?  Vitamin D deficiency disease 12/31/2018  ? ? ?Current Outpatient Medications on File Prior to Visit  ?Medication Sig Dispense Refill  ? acetaminophen (TYLENOL) 325 MG tablet Take 650 mg by mouth as needed.    ? aspirin EC 81 MG tablet Take 1 tablet (81 mg total) by mouth daily. 90 tablet 0  ? atorvastatin (LIPITOR) 80 MG tablet Take 1 tablet (80 mg total) by mouth daily. 90 tablet 3  ? chlorthalidone (HYGROTON) 25 MG tablet Take 1 tablet (25 mg total) by mouth daily. 90 tablet 3  ? Cholecalciferol (VITAMIN D-3) 125 MCG (5000 UT) TABS Take 1 tablet by mouth daily.    ? cloNIDine (CATAPRES) 0.3 MG tablet Take 1 tablet (0.3 mg total) by mouth 3 (three) times daily. 90 tablet 3  ? glipiZIDE (GLUCOTROL) 5 MG tablet Take 1 tablet (5 mg total) by mouth daily before breakfast. 90 tablet 0  ? hydrALAZINE (APRESOLINE) 50 MG tablet Take 1 tablet (50 mg total) by mouth 3 (three) times daily. 270 tablet 3  ? labetalol (NORMODYNE) 200 MG tablet Take 1 tablet (200 mg total) by mouth 2 (two) times daily. 180 tablet 3  ? NIFEdipine (PROCARDIA XL/NIFEDICAL-XL) 90 MG 24 hr tablet Take 1 tablet (  90 mg total) by mouth daily. 90 tablet 3  ? spironolactone (ALDACTONE) 25 MG tablet Take 1 tablet (25 mg total) by mouth daily. 30 tablet 3  ? valsartan (DIOVAN) 160 MG tablet Take 1 tablet (160 mg total) by mouth daily. 90 tablet 3  ? ?No current facility-administered medications on file prior to visit.  ? ? ?Allergies  ?Allergen Reactions  ? Lisinopril Cough  ? ? ? ?Assessment/Plan: ? ?1. Hypertension -  Patient BP in room 136/90 which is above goal of <130/80. Concern of patient's complaints of dizziness and fatigue after taking morning medications. Unfortunately she only takes her BP when she wakes up so its unclear how low her blood pressure is dropping. Will d/c hydralazine at this time and have patient continue to monitor BP at home. Patient will monitor or symptoms of hypotension and if BP increases, may be able to tolerate 25mg   hydralazine. Recheck in 4 weeks. ? ? , PharmD, BCACP, CDCES, CPP ?[address], [address] ?[place], [place], [place] ?Phone: [phone], Fax: [phone]  ?

## 2021-05-08 ENCOUNTER — Encounter: Payer: Medicare Other | Admitting: Cardiovascular Disease

## 2021-05-09 ENCOUNTER — Encounter: Payer: Self-pay | Admitting: Cardiology

## 2021-05-09 ENCOUNTER — Ambulatory Visit (INDEPENDENT_AMBULATORY_CARE_PROVIDER_SITE_OTHER): Payer: Medicare Other | Admitting: Cardiology

## 2021-05-09 VITALS — BP 134/94 | HR 80 | Ht 62.0 in | Wt 189.0 lb

## 2021-05-09 DIAGNOSIS — R0789 Other chest pain: Secondary | ICD-10-CM

## 2021-05-09 DIAGNOSIS — E782 Mixed hyperlipidemia: Secondary | ICD-10-CM

## 2021-05-09 NOTE — Progress Notes (Signed)
? ? ? ?Clinical Summary ?Danielle Jordan is a 71 y.o.female seen today for follow up of the following medical problems.  ? ?1. Chest pain ?- evaluated by Dr Bronson Ing initially Jan 2021 ?- from notes occurred in setting of severe HTN, symptoms resolved with bp control ?- 12/2016 echo LVEF 60-65%, no WMAs, grade I diastolic dysfunction ?  ?06/2020 nuclear stress: no ischemia ?- no recent chest pains.  ?  ?2. History of TIA/Stroke ?  ?3.Resistant HTN ?- followed in HTN clinic. 03/2021 phone note home bp's running low, hydral lowered to 50mg  and later held.  ?  ?4. Hyperlipidemia ?06/2020 TC 175 HDL 33 TG 167 LDL 114 ?- she is on atorvastatin 80mg , above panel was on 40mg  daily.  ?  ?5. CKD ?Cr has been improving.  ?- followed by Dr Theador Hawthorne ?Past Medical History:  ?Diagnosis Date  ? Hyperlipidemia   ? Hypertension   ? Resistant hypertension   ? Stroke [facility])   ? 2018  ? Vitamin D deficiency disease 12/31/2018  ? ? ? ?Allergies  ?Allergen Reactions  ? Lisinopril Cough  ? ? ? ?Current Outpatient Medications  ?Medication Sig Dispense Refill  ? acetaminophen (TYLENOL) 325 MG tablet Take 650 mg by mouth as needed.    ? atorvastatin (LIPITOR) 80 MG tablet Take 1 tablet (80 mg total) by mouth daily. 90 tablet 3  ? chlorthalidone (HYGROTON) 25 MG tablet Take 1 tablet (25 mg total) by mouth daily. 90 tablet 3  ? Cholecalciferol (VITAMIN D-3) 125 MCG (5000 UT) TABS Take 1 tablet by mouth daily.    ? glipiZIDE (GLUCOTROL) 5 MG tablet Take 1 tablet (5 mg total) by mouth daily before breakfast. 90 tablet 0  ? labetalol (NORMODYNE) 200 MG tablet Take 1 tablet (200 mg total) by mouth 2 (two) times daily. 180 tablet 3  ? NIFEdipine (PROCARDIA XL/NIFEDICAL-XL) 90 MG 24 hr tablet Take 1 tablet (90 mg total) by mouth daily. 90 tablet 3  ? spironolactone (ALDACTONE) 25 MG tablet Take 1 tablet (25 mg total) by mouth daily. 30 tablet 3  ? valsartan (DIOVAN) 160 MG tablet Take 1 tablet (160 mg total) by mouth daily. 90 tablet 3  ? ?No current  facility-administered medications for this visit.  ? ? ? ?Past Surgical History:  ?Procedure Laterality Date  ? CESAREAN SECTION    ? ? ? ?Allergies  ?Allergen Reactions  ? Lisinopril Cough  ? ? ? ? ?Family History  ?Problem Relation Age of Onset  ? Hypertension Mother   ? Hypertension Father   ? Arthritis Father   ? Diabetes Father   ? Bronchitis Brother   ?     Every year  ? Hypertension Son   ? Hypertension Son   ? ? ? ?Social History ?Ms. Rohrbach reports that she has never smoked. She has never used smokeless tobacco. ?Ms. Hennon reports no history of alcohol use. ? ? ?Review of Systems ?CONSTITUTIONAL: No weight loss, fever, chills, weakness or fatigue.  ?HEENT: Eyes: No visual loss, blurred vision, double vision or yellow sclerae.No hearing loss, sneezing, congestion, runny nose or sore throat.  ?SKIN: No rash or itching.  ?CARDIOVASCULAR: per hpi ?RESPIRATORY: No shortness of breath, cough or sputum.  ?GASTROINTESTINAL: No anorexia, nausea, vomiting or diarrhea. No abdominal pain or blood.  ?GENITOURINARY: No burning on urination, no polyuria ?NEUROLOGICAL: No headache, dizziness, syncope, paralysis, ataxia, numbness or tingling in the extremities. No change in bowel or bladder control.  ?MUSCULOSKELETAL: No muscle, back pain, joint  pain or stiffness.  ?LYMPHATICS: No enlarged nodes. No history of splenectomy.  ?PSYCHIATRIC: No history of depression or anxiety.  ?ENDOCRINOLOGIC: No reports of sweating, cold or heat intolerance. No polyuria or polydipsia.  ?. ? ? ?Physical Examination ?Today's Vitals  ? 05/09/21 1319  ?BP: (!) 134/94  ?Pulse: 80  ?SpO2: 97%  ?Weight: 189 lb (85.7 kg)  ?Height: 5\' 2"  (1.575 m)  ? ?Body mass index is 34.57 kg/m?. ? ?Gen: resting comfortably, no acute distress ?HEENT: no scleral icterus, pupils equal round and reactive, no palptable cervical adenopathy,  ?CV: RRR, no m/r/g no jvd ?Resp: Clear to auscultation bilaterally ?GI: abdomen is soft, non-tender, non-distended, normal bowel  sounds, no hepatosplenomegaly ?MSK: extremities are warm, no edema.  ?Skin: warm, no rash ?Neuro:  no focal deficits ?Psych: appropriate affect ? ? ?Diagnostic Studies ?01/2019 echo ?Summary  ?  1. The left ventricle is normal in size with mildly increased wall  ?thickness.  ?  2. The left ventricular systolic function is normal, LVEF is visually  ?estimated at > 55%.  ?  3. There is grade I diastolic dysfunction (impaired relaxation).  ?  4. The right ventricle is normal in size, with normal systolic function.  ?  ? ?06/2020 nuclear stress ?Lexiscan stress with less than 1 mm ST depression inferior and laterally during / after infusion. Not signiciant for ischemia ?Myoview scan with normal perfusion NO ischemia or scar TID noted, significance unknown. ?LVEF calculated at 72% with normal wall motion. ?This is a low risk study. ? ?Assessment and Plan  ?1. Chest pain ?- negative stress test last year, symptoms have resolved ?- continue to monitor ?  ?2. Resistant HTN ?- medical therapy limited by renal dysfunction though Cr has been imprvoing over time ?- defer management to HTN clinic ?  ?3. Hyperlipidemia ?- repeat lipid panel, continue atorvastatin 80mg  daily. Goal LDL would be <55 ?  ? ? ? ? ? ?Arnoldo Lenis, M.D. ?

## 2021-05-09 NOTE — Patient Instructions (Signed)
Medication Instructions:  ?Continue all current medications. ? ?Labwork: ?FLP - order given today ?Reminder:  Nothing to eat or drink after 12 midnight prior to labs. ?Office will contact with results via phone or letter.    ? ?Testing/Procedures: ?none ? ?Follow-Up: ?Your physician wants you to follow up in:  1 year.  You should receive a call from the office when due.  If you don't receive this call, please call our office to schedule the follow up appointment   ? ?Any Other Special Instructions Will Be Listed Below (If Applicable). ? ? ?If you need a refill on your cardiac medications before your next appointment, please call your pharmacy. ? ?

## 2021-05-24 NOTE — Progress Notes (Signed)
[employer] PREP Weekly Session ? ?Patient Details  ?Name: Danielle Jordan ?MRN: [id number] ?Date of Birth: 29-Jul-1950 ?Age: 71 y.o. ?PCP: Pcp, No ? ?Vitals:  ? 05/24/21 1430  ?Weight: 184 lb (83.5 kg)  ? ? ? [facility] Weekly seesion - 05/24/21 1700   ? ?  ? [facility] "PREP" Location  ? [facility]   ?  ? Weekly Session  ? Topic Discussed Calorie breakdown   ? Minutes exercised this week --   recorded cardio and strength as exercise. Unsure of minutes  ? Classes attended to date 9   ? ?  ?  ? ?  ? ? ?Bonnye Fava ?05/24/2021, 5:15 PM ? ? ?

## 2021-05-25 ENCOUNTER — Encounter: Payer: Self-pay | Admitting: Pharmacist

## 2021-05-25 ENCOUNTER — Ambulatory Visit (INDEPENDENT_AMBULATORY_CARE_PROVIDER_SITE_OTHER): Payer: Medicare Other | Admitting: Pharmacist

## 2021-05-25 VITALS — BP 116/80 | HR 70 | Resp 15 | Ht 62.0 in | Wt 189.4 lb

## 2021-05-25 DIAGNOSIS — I10 Essential (primary) hypertension: Secondary | ICD-10-CM

## 2021-05-25 DIAGNOSIS — E782 Mixed hyperlipidemia: Secondary | ICD-10-CM

## 2021-05-25 MED ORDER — ATORVASTATIN CALCIUM 80 MG PO TABS: 80.0000 mg | ORAL_TABLET | Freq: Every day | ORAL | 3 refills | Status: DC

## 2021-05-25 MED ORDER — HYDRALAZINE HCL 25 MG PO TABS: 25.0000 mg | ORAL_TABLET | Freq: Three times a day (TID) | ORAL | 1 refills | Status: DC

## 2021-05-25 NOTE — Patient Instructions (Addendum)
It was good seeing you again ? ?We would like your blood pressure to be less than 130/80 ? ?Please continue your: ? ?AM:  ?Valsartan 160 mg  ?Chlorthalidone 25mg   ?Labetalol 200mg   ?Hydralazine 25mg  daily ? ?Afternoon: ?Hydralazine 25mg   ? ?PM:  ?Spironolactone 25mg   ?Nifedipine 90mg   ?Labetalol 200mg   ?Hydralazine 25mg   ? ?If the top number on your blood pressure is less than 110 or if your are feeling dizzy and tired, do not take your hydralazine dose. ? ?Please call with any questions! ? ? , PharmD, BCACP, CDCES, CPP ?[address] ?[place], , ?Phone: [phone], Fax: [phone]  ? ? ?

## 2021-05-25 NOTE — Progress Notes (Signed)
Patient ID: Danielle Jordan                 DOB: 1951/01/13                      MRN: [id number] ? ? ? ? ?HPI: ?Danielle Jordan is a 71 y.o. female referred by Dr. Oval Linsey to HTN clinic. PMH is significant for HTN, AAA, T2DM, CKD, and TIA. This is patient's third PharmD visit. At last visit it was revealed she was taking all of her antihypertensives in the morning at the same time and was feeling dizzy afterwards.  Set up medication schedule and d/c hydralazine to see if hypotensive symptoms resolve.  Patient had since restarted hydralazine at 25mg  TID. ? ?Patient presents today with daughter in good spirits. Since adopting new medication schedule she is no longer having hypotensive episodes. Continues to check BP immediately upon awakening however. ? ?Home BP log: ?4/12: 137/81 ?4/11: 124/81, 138/76 ?4/10: 112/68, 127/81 ?4/9: 138/84, 122/80 ?4/8: 141/86, 126/76 ?4/7: 123/73 ? ?Daughter is concerned that sometimes her morning BP readings are low before taking her medications and she may be becoming hypotensive. ? ?Current HTN meds:  ?AM:  ?Valsartan 160 mg daily ?Chlorthalidone 25mg  daily ?Labetalol 200mg  BID ?Hydralazine 25mg  daily ? ?Noon: ?Hydralazine 25mg   ?  ? PM:  ?Spironolactone 25mg  daily ?Nifedipine 90mg  daily ?Labetalol 200mg  BID ?Hydralazine 25mg   ? ?Previously tried: Clonidine ? ?BP goal: <130/80 ? ?Wt Readings from Last 3 Encounters:  ?05/24/21 184 lb (83.5 kg)  ?05/09/21 189 lb (85.7 kg)  ?04/27/21 187 lb 3.2 oz (84.9 kg)  ? ?BP Readings from Last 3 Encounters:  ?05/09/21 (!) 134/94  ?04/27/21 136/90  ?03/24/21 (!) 180/100  ? ?Pulse Readings from Last 3 Encounters:  ?05/09/21 80  ?04/27/21 78  ?03/22/21 (!) 57  ? ? ?Renal function: ?CrCl cannot be calculated (Patient's most recent lab result is older than the maximum 21 days allowed.). ? ?Past Medical History:  ?Diagnosis Date  ? Hyperlipidemia   ? Hypertension   ? Resistant hypertension   ? Stroke [facility])   ? 2018  ? Vitamin D deficiency disease 12/31/2018   ? ? ?Current Outpatient Medications on File Prior to Visit  ?Medication Sig Dispense Refill  ? acetaminophen (TYLENOL) 325 MG tablet Take 650 mg by mouth as needed.    ? atorvastatin (LIPITOR) 80 MG tablet Take 1 tablet (80 mg total) by mouth daily. 90 tablet 3  ? chlorthalidone (HYGROTON) 25 MG tablet Take 1 tablet (25 mg total) by mouth daily. 90 tablet 3  ? Cholecalciferol (VITAMIN D-3) 125 MCG (5000 UT) TABS Take 1 tablet by mouth daily.    ? glipiZIDE (GLUCOTROL) 5 MG tablet Take 1 tablet (5 mg total) by mouth daily before breakfast. 90 tablet 0  ? hydrALAZINE (APRESOLINE) 25 MG tablet Take 25 mg by mouth daily.    ? labetalol (NORMODYNE) 200 MG tablet Take 1 tablet (200 mg total) by mouth 2 (two) times daily. 180 tablet 3  ? NIFEdipine (PROCARDIA XL/NIFEDICAL-XL) 90 MG 24 hr tablet Take 1 tablet (90 mg total) by mouth daily. 90 tablet 3  ? spironolactone (ALDACTONE) 25 MG tablet Take 1 tablet (25 mg total) by mouth daily. 30 tablet 3  ? valsartan (DIOVAN) 160 MG tablet Take 1 tablet (160 mg total) by mouth daily. 90 tablet 3  ? ?No current facility-administered medications on file prior to visit.  ? ? ?Allergies  ?Allergen Reactions  ? Lisinopril Cough  ? ? ? ?  Assessment/Plan: ? ?1. Hypertension -  Patient BP in room 116/80 which is at goal of <130/80.  Patient tolerating current medications well and thinks hydralazine 25mg  is the correct dose for her.  Home readings are much improved with some at goal and some near goal. Since daughter is concerned regarding mother possibly becoming hypotensive and patient not willing to check BP after taking medications, advised she can skip her morning hydralazine dose if systolic BP is [id number].  Patient and daughter voiced understanding.  No med changes needed at this time. ? ?Continue: ?AM:  ?Valsartan 160 mg daily ?Chlorthalidone 25mg  daily ?Labetalol 200mg  BID ?Hydralazine 25mg  daily ? ?Noon: ?Hydralazine 25mg   ?  ? PM:  ?Spironolactone 25mg  daily ?Nifedipine 90mg   daily ?Labetalol 200mg  BID ?Hydralazine 25mg   ? ?Recheck as needed ? ?Karren Cobble, PharmD, BCACP, [place], CPP ?[address], [address] ?[place], [place], [address] ?Phone: [phone], Fax: [phone]  ?

## 2021-06-15 NOTE — Progress Notes (Signed)
Final PREP class 06/14/21 ?Did not return for final measurements ?Attended 10 of workouts of 24 offered. Attended 5 of 12 educational sessions.  ?

## 2021-06-18 ENCOUNTER — Other Ambulatory Visit: Payer: Self-pay | Admitting: Cardiovascular Disease

## 2021-06-20 ENCOUNTER — Other Ambulatory Visit: Payer: Self-pay

## 2021-06-20 MED ORDER — GLIPIZIDE 5 MG PO TABS: 5.0000 mg | ORAL_TABLET | Freq: Every day | ORAL | 1 refills | Status: DC

## 2021-07-07 ENCOUNTER — Ambulatory Visit (INDEPENDENT_AMBULATORY_CARE_PROVIDER_SITE_OTHER): Payer: Medicare Other | Admitting: Nurse Practitioner

## 2021-07-07 ENCOUNTER — Encounter: Payer: Self-pay | Admitting: Nurse Practitioner

## 2021-07-07 VITALS — BP 136/93 | HR 70 | Ht 62.0 in | Wt 189.0 lb

## 2021-07-07 DIAGNOSIS — I10 Essential (primary) hypertension: Secondary | ICD-10-CM

## 2021-07-07 DIAGNOSIS — Z1211 Encounter for screening for malignant neoplasm of colon: Secondary | ICD-10-CM

## 2021-07-07 DIAGNOSIS — E782 Mixed hyperlipidemia: Secondary | ICD-10-CM

## 2021-07-07 DIAGNOSIS — Z1231 Encounter for screening mammogram for malignant neoplasm of breast: Secondary | ICD-10-CM | POA: Diagnosis not present

## 2021-07-07 DIAGNOSIS — E118 Type 2 diabetes mellitus with unspecified complications: Secondary | ICD-10-CM

## 2021-07-07 DIAGNOSIS — Z780 Asymptomatic menopausal state: Secondary | ICD-10-CM

## 2021-07-07 DIAGNOSIS — N1832 Chronic kidney disease, stage 3b: Secondary | ICD-10-CM

## 2021-07-07 NOTE — Assessment & Plan Note (Signed)
  BP Readings from Last 3 Encounters:  07/07/21 (!) 136/93  05/25/21 116/80  05/09/21 (!) 134/94  Chronic condition BP was normal few months ago when seen by cardiology, will not make adjustment to medications today currently on chlorthalidone 25 mg tablet daily, hydralazine 25 mg 3 times daily, labetalol 200 mg twice daily, nifedipine 90 mg daily, spironolactone 25 mg daily, valsartan 160 mg daily. Followed by cardiology and nephrologist DASH diet advised, engage in regular moderate exercises at least for 50 minutes weekly. Patient encouraged to check blood pressure daily at home and call the office if her numbers states greater than 130/80 Follow-up in 4 months

## 2021-07-07 NOTE — Progress Notes (Signed)
New Patient Office Visit  Subjective    Patient ID: Danielle Jordan, female    DOB: Jun 29, 1950  Age: 71 y.o. MRN: [id number]  CC:  Chief Complaint  Patient presents with   New Patient (Initial Visit)    NP    HPI Danielle Jordan with past medical history of resistant hypertension, uncontrolled diabetes, hyperlipidemia, vitamin D deficiency, AAA without rupture, hypokalemia, thalamic hemorrhage presents to establish care for her chronic medical conditions previous PCP Dr Conception Chancy.     Resistant HTN.  Currently on chlorthalidone 25 mg tablet daily, hydralazine 25 mg 3 times daily, labetalol 200 mg twice daily, nifedipine 90 mg daily, spironolactone 25 mg daily, valsartan 160 mg daily.  She is being followed by nephrology and cardiology. patient stated that she check s her blood pressure at home daily , reports that her systolic BP at home has been ranging from 140s to 150.   pt denies HA, CP, syncope , ededma   CKD stage III .  Followed by nephrology ,states that she missed her last appointment with nephrology.  Patient encouraged to reschedule her appointment with nephrology she verbalized understanding.  Hyperlipidemia.  Has history of thalamic hemorrhage, on atorvastatin 80 mg daily.  Type 2 diabetes.  Currently taking glipizide 5 mg daily denies polyphagia polyuria polydipsia.   Due for pneumonia vaccine, shingles vaccine need for both vaccines discussed with patient patient encouraged to get it 5 times at her pharmacy.   Due for colon cancer screening, mammogram, DEXA scan, referral was sent today   Outpatient Encounter Medications as of 07/07/2021  Medication Sig   acetaminophen (TYLENOL) 325 MG tablet Take 650 mg by mouth as needed.   atorvastatin (LIPITOR) 80 MG tablet Take 1 tablet (80 mg total) by mouth daily.   chlorthalidone (HYGROTON) 25 MG tablet Take 1 tablet (25 mg total) by mouth daily.   Cholecalciferol (VITAMIN D-3) 125 MCG (5000 UT) TABS Take 1 tablet by mouth  daily.   glipiZIDE (GLUCOTROL) 5 MG tablet Take 1 tablet (5 mg total) by mouth daily before breakfast.   hydrALAZINE (APRESOLINE) 25 MG tablet Take 1 tablet (25 mg total) by mouth 3 (three) times daily.   labetalol (NORMODYNE) 200 MG tablet Take 1 tablet (200 mg total) by mouth 2 (two) times daily.   NIFEdipine (PROCARDIA XL/NIFEDICAL-XL) 90 MG 24 hr tablet Take 1 tablet (90 mg total) by mouth daily.   spironolactone (ALDACTONE) 25 MG tablet Take 1 tablet (25 mg total) by mouth daily.   valsartan (DIOVAN) 160 MG tablet Take 1 tablet (160 mg total) by mouth daily.   No facility-administered encounter medications on file as of 07/07/2021.    Past Medical History:  Diagnosis Date   Hyperlipidemia    Hypertension    Resistant hypertension    Stroke ([place])    2018   Vitamin D deficiency disease 12/31/2018    Past Surgical History:  Procedure Laterality Date   CESAREAN SECTION      Family History  Problem Relation Age of Onset   Hypertension Mother    Hypertension Father    Arthritis Father    Diabetes Father    Bronchitis Brother        Every year   Hypertension Son    Hypertension Son    Colon cancer Neg Hx    Breast cancer Neg Hx     Social History   Socioeconomic History   Marital status: Divorced    Spouse name: Not on file  Number of children: 4   Years of education: Not on file   Highest education level: Not on file  Occupational History   Not on file  Tobacco Use   Smoking status: Never   Smokeless tobacco: Never  Vaping Use   Vaping Use: Never used  Substance and Sexual Activity   Alcohol use: No   Drug use: No   Sexual activity: Not on file  Other Topics Concern   Not on file  Social History Narrative    Not working at this time. Has 3 boys and 1 girl. Divorced. Lives with daughter   Social Determinants of Health   Financial Resource Strain: Not on file  Food Insecurity: Not on file  Transportation Needs: Not on file  Physical Activity: Not on  file  Stress: Not on file  Social Connections: Not on file  Intimate Partner Violence: Not on file    Review of Systems  Constitutional: Negative.  Negative for chills, fever, malaise/fatigue and weight loss.  Respiratory:  Negative for cough, hemoptysis, sputum production, shortness of breath and wheezing.   Cardiovascular: Negative.  Negative for chest pain, palpitations and orthopnea.  Neurological: Negative.  Negative for dizziness, tingling, tremors and headaches.  Psychiatric/Behavioral: Negative.  Negative for depression, substance abuse and suicidal ideas.        Objective    BP (!) 136/93   Pulse 70   Ht 5\' 2"  (1.575 m)   Wt 189 lb (85.7 kg)   SpO2 94%   BMI 34.57 kg/m   Physical Exam Constitutional:      General: She is not in acute distress.    Appearance: Normal appearance. She is obese. She is not ill-appearing or toxic-appearing.  Cardiovascular:     Rate and Rhythm: Normal rate and regular rhythm.     Pulses: Normal pulses.     Heart sounds: Normal heart sounds. No murmur heard.   No friction rub. No gallop.  Pulmonary:     Effort: No respiratory distress.     Breath sounds: Normal breath sounds. No stridor. No wheezing, rhonchi or rales.  Chest:     Chest wall: No tenderness.  Abdominal:     Palpations: Abdomen is soft.  Skin:    Capillary Refill: Capillary refill takes less than 2 seconds.  Neurological:     Mental Status: She is alert and oriented to person, place, and time.     Cranial Nerves: No cranial nerve deficit.     Sensory: No sensory deficit.     Motor: No weakness.     Coordination: Coordination normal.     Gait: Gait normal.  Psychiatric:        Mood and Affect: Mood normal.        Behavior: Behavior normal.        Thought Content: Thought content normal.        Judgment: Judgment normal.        Assessment & Plan:   Problem List Items Addressed This Visit       Cardiovascular and Mediastinum   Resistant hypertension      BP Readings from Last 3 Encounters:  07/07/21 (!) 136/93  05/25/21 116/80  05/09/21 (!) 134/94  Chronic condition BP was normal few months ago when seen by cardiology, will not make adjustment to medications today currently on chlorthalidone 25 mg tablet daily, hydralazine 25 mg 3 times daily, labetalol 200 mg twice daily, nifedipine 90 mg daily, spironolactone 25 mg daily, valsartan 160 mg daily.  Followed by cardiology and nephrologist DASH diet advised, engage in regular moderate exercises at least for 50 minutes weekly. Patient encouraged to check blood pressure daily at home and call the office if her numbers states greater than 130/80 Follow-up in 4 months        Endocrine   Type 2 diabetes mellitus with complication, without long-term current use of insulin (HCC) - Primary    Last A1c a year ago was 8.4 Currently taking glipizide 5 mg tablet daily Avoid sugar sweets order engage in regular daily exercises at least for 50 minutes weekly Check A1c, urine creatinine labs Diabetic eye exam scheduled today Will do foot exam at next visit I discussed starting patient on Jardiance one half labs are back side effects of Jardiance discussed with patient she verbalized understanding       Relevant Orders   HgB A1c   Urine microalbumin-creatinine with uACR     Genitourinary   Chronic kidney disease, stage 3 unspecified ([place])    On ARB followed by nephrology Patient stated that she missed her last appointments with nephrology patient encouraged to reschedule appointment Avoid NSAIDs drink at least 64 ounces of water daily Planning on starting patient on Jardiance once labs are back.         Other   Hyperlipidemia    Lab Results  Component Value Date   CHOL 175 06/17/2020   HDL 33 (L) 06/17/2020   LDLCALC 114 (H) 06/17/2020   TRIG 167 (H) 06/17/2020   CHOLHDL 5.3 (H) 06/17/2020  On atorvastatin 80 mg daily Lipid panel ordered      Relevant Orders   Lipid Profile    Post-menopausal   Relevant Orders   DG Bone Density   Other Visit Diagnoses     Encounter for screening mammogram for malignant neoplasm of breast       Relevant Orders   MM 3D SCREEN BREAST BILATERAL   Screening for colon cancer       Relevant Orders   Cologuard       Return in about 4 months (around 11/07/2021) for HTN/DM.   Renee Rival, FNP

## 2021-07-07 NOTE — Assessment & Plan Note (Signed)
Last A1c a year ago was 8.4 Currently taking glipizide 5 mg tablet daily Avoid sugar sweets order engage in regular daily exercises at least for 50 minutes weekly Check A1c, urine creatinine labs Diabetic eye exam scheduled today Will do foot exam at next visit I discussed starting patient on Jardiance one half labs are back side effects of Jardiance discussed with patient she verbalized understanding

## 2021-07-07 NOTE — Assessment & Plan Note (Signed)
On ARB followed by nephrology Patient stated that she missed her last appointments with nephrology patient encouraged to reschedule appointment Avoid NSAIDs drink at least 64 ounces of water daily Planning on starting patient on Jardiance once labs are back.

## 2021-07-07 NOTE — Patient Instructions (Addendum)
Please schedule diabetic eye exam today   Please get your pneumonia vaccine, shingles vaccine at your pharmacy    Please monitor your blood pressure at home goal is less than 130/80  Fasting labs tomorrow.   It is important that you exercise regularly at least 30 minutes 5 times a week.  Think about what you will eat, plan ahead. Choose " clean, green, fresh or frozen" over canned, processed or packaged foods which are more sugary, salty and fatty. 70 to 75% of food eaten should be vegetables and fruit. Three meals at set times with snacks allowed between meals, but they must be fruit or vegetables. Aim to eat over a 12 hour period , example 7 am to 7 pm, and STOP after  your last meal of the day. Drink water,generally about 64 ounces per day, no other drink is as healthy. Fruit juice is best enjoyed in a healthy way, by EATING the fruit.  Thanks for choosing [facility], we consider it a privelige to serve you.

## 2021-07-07 NOTE — Assessment & Plan Note (Signed)
Lab Results  Component Value Date   CHOL 175 06/17/2020   HDL 33 (L) 06/17/2020   LDLCALC 114 (H) 06/17/2020   TRIG 167 (H) 06/17/2020   CHOLHDL 5.3 (H) 06/17/2020   On atorvastatin 80 mg daily Lipid panel ordered

## 2021-07-13 ENCOUNTER — Other Ambulatory Visit (HOSPITAL_COMMUNITY): Payer: Self-pay | Admitting: Nurse Practitioner

## 2021-07-13 DIAGNOSIS — R921 Mammographic calcification found on diagnostic imaging of breast: Secondary | ICD-10-CM

## 2021-07-14 ENCOUNTER — Ambulatory Visit (HOSPITAL_COMMUNITY): Payer: Medicare Other

## 2021-07-14 ENCOUNTER — Other Ambulatory Visit (HOSPITAL_COMMUNITY): Payer: Medicare Other

## 2021-07-20 LAB — MICROALBUMIN / CREATININE URINE RATIO
Creatinine, Urine: 101.6 mg/dL
Microalb/Creat Ratio: 13 mg/g creat (ref 0–29)
Microalbumin, Urine: 12.8 ug/mL

## 2021-07-20 LAB — LIPID PANEL
Chol/HDL Ratio: 2.4 ratio (ref 0.0–4.4)
Cholesterol, Total: 76 mg/dL — ABNORMAL LOW (ref 100–199)
HDL: 32 mg/dL — ABNORMAL LOW (ref >39)
LDL Chol Calc (NIH): 32 mg/dL (ref 0–99)
Triglycerides: 45 mg/dL (ref 0–149)
VLDL Cholesterol Cal: 12 mg/dL (ref 5–40)

## 2021-07-20 LAB — HEMOGLOBIN A1C
Est. average glucose Bld gHb Est-mCnc: 131 mg/dL
Hgb A1c MFr Bld: 6.2 % — ABNORMAL HIGH (ref 4.8–5.6)

## 2021-07-20 NOTE — Progress Notes (Signed)
Good morning Dr Harl Bowie. Pt is currently on atorvastatin 80mg  daily , this is her most recent lipid panel result, I was wondering if we need to decrease her current dose of atorvastatin. Her LDL goal is less than 55.  Thank you

## 2021-07-26 ENCOUNTER — Ambulatory Visit (HOSPITAL_COMMUNITY)
Admission: RE | Admit: 2021-07-26 | Discharge: 2021-07-26 | Disposition: A | Discharge status: Home / Self Care | Payer: Medicare Other | Source: Ambulatory Visit | Attending: Nurse Practitioner | Admitting: Nurse Practitioner

## 2021-07-26 DIAGNOSIS — R921 Mammographic calcification found on diagnostic imaging of breast: Secondary | ICD-10-CM

## 2021-07-27 NOTE — Progress Notes (Signed)
Normal test , follow up mammogram in one year

## 2021-08-03 ENCOUNTER — Encounter: Payer: Self-pay | Admitting: Nurse Practitioner

## 2021-08-04 ENCOUNTER — Telehealth: Payer: Self-pay

## 2021-08-04 NOTE — Telephone Encounter (Signed)
Cancel DRS in office needs a referral sent to Ophthalmology to [facility] in [place]

## 2021-08-09 ENCOUNTER — Ambulatory Visit: Payer: Medicare Other

## 2021-11-07 ENCOUNTER — Ambulatory Visit: Payer: Medicare Other | Admitting: Nurse Practitioner

## 2021-11-08 ENCOUNTER — Encounter: Payer: Self-pay | Admitting: Family Medicine

## 2021-11-08 ENCOUNTER — Ambulatory Visit (INDEPENDENT_AMBULATORY_CARE_PROVIDER_SITE_OTHER): Payer: Medicare Other | Admitting: Family Medicine

## 2021-11-08 VITALS — BP 132/86 | HR 96 | Ht 62.0 in | Wt 192.0 lb

## 2021-11-08 DIAGNOSIS — I10 Essential (primary) hypertension: Secondary | ICD-10-CM

## 2021-11-08 DIAGNOSIS — Z23 Encounter for immunization: Secondary | ICD-10-CM

## 2021-11-08 DIAGNOSIS — E559 Vitamin D deficiency, unspecified: Secondary | ICD-10-CM

## 2021-11-08 DIAGNOSIS — E118 Type 2 diabetes mellitus with unspecified complications: Secondary | ICD-10-CM

## 2021-11-08 DIAGNOSIS — Z1211 Encounter for screening for malignant neoplasm of colon: Secondary | ICD-10-CM | POA: Diagnosis not present

## 2021-11-08 DIAGNOSIS — E782 Mixed hyperlipidemia: Secondary | ICD-10-CM

## 2021-11-08 MED ORDER — GLIPIZIDE 5 MG PO TABS: 5.0000 mg | ORAL_TABLET | Freq: Every day | ORAL | 1 refills | Status: DC

## 2021-11-08 NOTE — Assessment & Plan Note (Signed)
Stable on lipitor 80 mg daily She denies chest pain, sob and chest tightness.

## 2021-11-08 NOTE — Patient Instructions (Signed)
I appreciate the opportunity to provide care to you today!    Follow up:  4 months  Labs: please stop by the lab during the week to get your blood drawn (CBC, CMP, TSH, Lipid profile, HgA1c, Vit D)   Please pick up your refills at the pharmacy   Recommend conservative managements to help decrease swelling in your hands and feet.   -Reduce salt (sodium) in your diet -- Sodium, which is found in table salt and processed foods, can worsen edema. Reducing the amount of salt you consume can help to reduce edema, especially if you also take a diuretic.  -Compression stockings -- Leg edema can be prevented and treated with the use of compression stockings. Stockings are available in several heights, including knee-high, thigh-high, and pantyhose. Knee-high stockings are sufficient for most patients. - Body positioning -- Leg, ankle, and foot edema can be improved by elevating the legs above heart level for 30 minutes three or four times per day. Elevating the legs may be sufficient to reduce or eliminate edema   Please stop by your local pharmacy and get your Tdap and Shingles vaccine  Referrals today-    Please continue to a heart-healthy diet and increase your physical activities. Try to exercise for 22mins at least three times a week.      It was a pleasure to see you and I look forward to continuing to work together on your health and well-being. Please do not hesitate to call the office if you need care or have questions about your care.   Have a wonderful day and week. With Gratitude, Alvira Monday MSN, FNP-BC

## 2021-11-08 NOTE — Assessment & Plan Note (Signed)
She denies the 3ps of diabetes She reports adherece to glipizide 5 mg daily

## 2021-11-08 NOTE — Progress Notes (Signed)
Established Patient Office Visit  Subjective:  Patient ID: Danielle Jordan, female    DOB: 26-Dec-1950  Age: 72 y.o. MRN: [id number]  CC:  Chief Complaint  Patient presents with   Follow-up    Blood pressure has been runing high mostly in the moring times since July 2023 some swelling in left hand on and off since July 2023.    HPI Danielle Jordan is a 71 y.o. female with past medical history of hypertension, T2DM presents for f/u of  chronic medical conditions.  HTN: controlled. She takes chlorthalidone 25 mg, hydralazine 25 mg, labetalol 200 mg daily, procardia 90 mg, spironolactone 25 mg and valsartan 160 mg daily. She admits to a high sodium diet recently and c/o of swelling in her left hand.  Hyperlipidemia: stable on lipitor 80 mg daily. She denies chest pain, sob and chest tightness.  T2DM: she denies the 3ps of diabetes. She reports adherece to glipizide 5 mg daily  Past Medical History:  Diagnosis Date   Hyperlipidemia    Hypertension    Resistant hypertension    Stroke ([place])    2018   Vitamin D deficiency disease 12/31/2018    Past Surgical History:  Procedure Laterality Date   CESAREAN SECTION      Family History  Problem Relation Age of Onset   Hypertension Mother    Hypertension Father    Arthritis Father    Diabetes Father    Bronchitis Brother        Every year   Hypertension Son    Hypertension Son    Colon cancer Neg Hx    Breast cancer Neg Hx     Social History   Socioeconomic History   Marital status: Divorced    Spouse name: Not on file   Number of children: 4   Years of education: Not on file   Highest education level: Not on file  Occupational History   Not on file  Tobacco Use   Smoking status: Never   Smokeless tobacco: Never  Vaping Use   Vaping Use: Never used  Substance and Sexual Activity   Alcohol use: No   Drug use: No   Sexual activity: Not on file  Other Topics Concern   Not on file  Social History Narrative    Not  working at this time. Has 3 boys and 1 girl. Divorced. Lives with daughter   Social Determinants of Health   Financial Resource Strain: Not on file  Food Insecurity: Not on file  Transportation Needs: Not on file  Physical Activity: Not on file  Stress: Not on file  Social Connections: Not on file  Intimate Partner Violence: Not on file    Outpatient Medications Prior to Visit  Medication Sig Dispense Refill   acetaminophen (TYLENOL) 325 MG tablet Take 650 mg by mouth as needed.     atorvastatin (LIPITOR) 80 MG tablet Take 1 tablet (80 mg total) by mouth daily. 90 tablet 3   chlorthalidone (HYGROTON) 25 MG tablet Take 1 tablet (25 mg total) by mouth daily. 90 tablet 3   Cholecalciferol (VITAMIN D-3) 125 MCG (5000 UT) TABS Take 1 tablet by mouth daily.     hydrALAZINE (APRESOLINE) 25 MG tablet Take 1 tablet (25 mg total) by mouth 3 (three) times daily. 270 tablet 1   labetalol (NORMODYNE) 200 MG tablet Take 1 tablet (200 mg total) by mouth 2 (two) times daily. 180 tablet 3   NIFEdipine (PROCARDIA XL/NIFEDICAL-XL) 90 MG 24 hr tablet  Take 1 tablet (90 mg total) by mouth daily. 90 tablet 3   spironolactone (ALDACTONE) 25 MG tablet Take 1 tablet (25 mg total) by mouth daily. 30 tablet 3   valsartan (DIOVAN) 160 MG tablet Take 1 tablet (160 mg total) by mouth daily. 90 tablet 3   glipiZIDE (GLUCOTROL) 5 MG tablet Take 1 tablet (5 mg total) by mouth daily before breakfast. 90 tablet 1   No facility-administered medications prior to visit.    Allergies  Allergen Reactions   Lisinopril Cough    ROS Review of Systems  Constitutional:  Negative for chills and fever.  HENT:  Negative for congestion and sore throat.   Eyes:  Negative for visual disturbance.  Respiratory:  Negative for chest tightness and shortness of breath.   Cardiovascular:  Negative for chest pain, palpitations and leg swelling.  Gastrointestinal:  Negative for diarrhea, nausea and vomiting.  Endocrine: Negative for  polydipsia, polyphagia and polyuria.  Neurological:  Negative for dizziness, light-headedness and headaches.  Psychiatric/Behavioral:  Negative for self-injury and suicidal ideas.       Objective:    Physical Exam HENT:     Head: Normocephalic.  Cardiovascular:     Rate and Rhythm: Normal rate and regular rhythm.     Pulses: Normal pulses.     Heart sounds: Normal heart sounds.  Pulmonary:     Effort: Pulmonary effort is normal.     Breath sounds: Normal breath sounds.  Abdominal:     Palpations: Abdomen is soft.  Neurological:     Mental Status: She is alert.     BP 132/86 (BP Location: Right Arm, Patient Position: Sitting)   Pulse 96   Ht _0  (1.575 m)   Wt 192 lb (87.1 kg)   SpO2 93%   BMI 35.12 kg/m  Wt Readings from Last 3 Encounters:  11/08/21 192 lb (87.1 kg)  07/07/21 189 lb (85.7 kg)  05/25/21 189 lb 6.4 oz (85.9 kg)    Lab Results  Component Value Date   TSH 1.790 03/02/2021   Lab Results  Component Value Date   WBC 8.3 12/20/2016   HGB 12.2 12/20/2016   HCT 38.8 12/20/2016   MCV 85.3 12/20/2016   PLT 247 12/20/2016   Lab Results  Component Value Date   NA 141 03/22/2021   K 4.4 03/22/2021   CO2 25 03/22/2021   GLUCOSE 172 (H) 03/22/2021   BUN 22 03/22/2021   CREATININE 1.30 (H) 03/22/2021   BILITOT 0.6 06/17/2020   ALKPHOS 56 12/18/2016   AST 13 06/17/2020   ALT 13 06/17/2020   PROT 8.0 06/17/2020   ALBUMIN 4.0 12/18/2016   CALCIUM 9.4 03/22/2021   ANIONGAP 9 12/20/2016   EGFR 45 (L) 03/22/2021   Lab Results  Component Value Date   CHOL 76 (L) 07/18/2021   Lab Results  Component Value Date   HDL 32 (L) 07/18/2021   Lab Results  Component Value Date   LDLCALC 32 07/18/2021   Lab Results  Component Value Date   TRIG 45 07/18/2021   Lab Results  Component Value Date   CHOLHDL 2.4 07/18/2021   Lab Results  Component Value Date   HGBA1C 6.2 (H) 07/18/2021      Assessment & Plan:   Problem List Items Addressed This  Visit       Cardiovascular and Mediastinum   Essential (primary) hypertension    Controlled She takes chlorthalidone 25 mg, hydralazine 25 mg, labetalol 200 mg daily, procardia  90 mg, spironolactone 25 mg and valsartan 160 mg daily She admits to a high sodium diet recently and c/o of swelling in her left hand Recommend low sodium intake and inform the patient that her hand swelling is likely due to her high intake of sodium  BP Readings from Last 3 Encounters:  11/08/21 132/86  07/07/21 (!) 136/93  05/25/21 116/80         Endocrine   Type 2 diabetes mellitus with complication, without long-term current use of insulin ([place]) - Primary    She denies the 3ps of diabetes She reports adherece to glipizide 5 mg daily      Relevant Medications   glipiZIDE (GLUCOTROL) 5 MG tablet   Other Relevant Orders   CBC with Differential/Platelet   CMP14+EGFR   TSH + free T4   Hemoglobin A1C   Lipid Profile   CBC with Differential/Platelet   CMP14+EGFR   Hemoglobin A1C   TSH + free T4   Lipid Profile     Other   Hyperlipidemia    Stable on lipitor 80 mg daily She denies chest pain, sob and chest tightness.      Vitamin D deficiency   Relevant Orders   Vitamin D (25 hydroxy)   Vitamin D (25 hydroxy)   Other Visit Diagnoses     Immunization due       Relevant Orders   Pneumococcal conjugate vaccine 20-valent (Completed)   Screening for colon cancer       Relevant Orders   Ambulatory referral to Gastroenterology       Meds ordered this encounter  Medications   glipiZIDE (GLUCOTROL) 5 MG tablet    Sig: Take 1 tablet (5 mg total) by mouth daily before breakfast.    Dispense:  90 tablet    Refill:  1    PLEASE SCHEDULE AN APPT FOR FUTURE REFILLS    Follow-up: Return in about 4 months (around 03/10/2022).    Alvira Monday, FNP

## 2021-11-08 NOTE — Assessment & Plan Note (Addendum)
Controlled She takes chlorthalidone 25 mg, hydralazine 25 mg, labetalol 200 mg daily, procardia 90 mg, spironolactone 25 mg and valsartan 160 mg daily She admits to a high sodium diet recently and c/o of swelling in her left hand Recommend low sodium intake and inform the patient that her hand swelling is likely due to her high intake of sodium  BP Readings from Last 3 Encounters:  11/08/21 132/86  07/07/21 (!) 136/93  05/25/21 116/80

## 2021-11-09 ENCOUNTER — Encounter: Payer: Self-pay | Admitting: *Deleted

## 2021-11-10 LAB — HM DIABETES EYE EXAM

## 2021-11-11 ENCOUNTER — Encounter: Payer: Self-pay | Admitting: Family Medicine

## 2021-11-11 NOTE — Progress Notes (Signed)
documentation

## 2021-11-26 LAB — LIPID PANEL
Chol/HDL Ratio: 2.4 ratio (ref 0.0–4.4)
Cholesterol, Total: 88 mg/dL — ABNORMAL LOW (ref 100–199)
HDL: 37 mg/dL — ABNORMAL LOW (ref >39)
LDL Chol Calc (NIH): 38 mg/dL (ref 0–99)
Triglycerides: 52 mg/dL (ref 0–149)
VLDL Cholesterol Cal: 13 mg/dL (ref 5–40)

## 2021-11-26 LAB — CBC WITH DIFFERENTIAL/PLATELET
Basophils Absolute: 0 10*3/uL (ref 0.0–0.2)
Basos: 0 %
EOS (ABSOLUTE): 0.2 10*3/uL (ref 0.0–0.4)
Eos: 2 %
Hematocrit: 39.6 % (ref 34.0–46.6)
Hemoglobin: 12.3 g/dL (ref 11.1–15.9)
Immature Grans (Abs): 0 10*3/uL (ref 0.0–0.1)
Immature Granulocytes: 0 %
Lymphocytes Absolute: 2.7 10*3/uL (ref 0.7–3.1)
Lymphs: 26 %
MCH: 25.5 pg — ABNORMAL LOW (ref 26.6–33.0)
MCHC: 31.1 g/dL — ABNORMAL LOW (ref 31.5–35.7)
MCV: 82 fL (ref 79–97)
Monocytes Absolute: 0.4 10*3/uL (ref 0.1–0.9)
Monocytes: 4 %
Neutrophils Absolute: 6.9 10*3/uL (ref 1.4–7.0)
Neutrophils: 68 %
Platelets: 256 10*3/uL (ref 150–450)
RBC: 4.82 x10E6/uL (ref 3.77–5.28)
RDW: 13.8 % (ref 11.7–15.4)
WBC: 10.2 10*3/uL (ref 3.4–10.8)

## 2021-11-26 LAB — CMP14+EGFR
ALT: 13 IU/L (ref 0–32)
AST: 15 IU/L (ref 0–40)
Albumin/Globulin Ratio: 1.4 (ref 1.2–2.2)
Albumin: 4.2 g/dL (ref 3.9–4.9)
Alkaline Phosphatase: 99 IU/L (ref 44–121)
BUN/Creatinine Ratio: 20 (ref 12–28)
BUN: 31 mg/dL — ABNORMAL HIGH (ref 8–27)
Bilirubin Total: 0.5 mg/dL (ref 0.0–1.2)
CO2: 22 mmol/L (ref 20–29)
Calcium: 9.5 mg/dL (ref 8.7–10.3)
Chloride: 102 mmol/L (ref 96–106)
Creatinine, Ser: 1.56 mg/dL — ABNORMAL HIGH (ref 0.57–1.00)
Globulin, Total: 2.9 g/dL (ref 1.5–4.5)
Glucose: 105 mg/dL — ABNORMAL HIGH (ref 70–99)
Potassium: 4.1 mmol/L (ref 3.5–5.2)
Sodium: 140 mmol/L (ref 134–144)
Total Protein: 7.1 g/dL (ref 6.0–8.5)
eGFR: 36 mL/min/{1.73_m2} — ABNORMAL LOW (ref >59)

## 2021-11-26 LAB — TSH+FREE T4
Free T4: 1.28 ng/dL (ref 0.82–1.77)
TSH: 1.88 u[IU]/mL (ref 0.450–4.500)

## 2021-11-26 LAB — VITAMIN D 25 HYDROXY (VIT D DEFICIENCY, FRACTURES): Vit D, 25-Hydroxy: 46.5 ng/mL (ref 30.0–100.0)

## 2021-11-26 LAB — HEMOGLOBIN A1C
Est. average glucose Bld gHb Est-mCnc: 163 mg/dL
Hgb A1c MFr Bld: 7.3 % — ABNORMAL HIGH (ref 4.8–5.6)

## 2021-11-28 ENCOUNTER — Other Ambulatory Visit: Payer: Self-pay | Admitting: Family Medicine

## 2021-11-28 DIAGNOSIS — N1832 Chronic kidney disease, stage 3b: Secondary | ICD-10-CM

## 2021-11-28 DIAGNOSIS — E118 Type 2 diabetes mellitus with unspecified complications: Secondary | ICD-10-CM

## 2021-11-28 MED ORDER — GLIPIZIDE 5 MG PO TABS: 5.0000 mg | ORAL_TABLET | Freq: Two times a day (BID) | ORAL | 3 refills | Status: DC

## 2021-11-28 MED ORDER — DAPAGLIFLOZIN PROPANEDIOL 5 MG PO TABS: 5.0000 mg | ORAL_TABLET | Freq: Every day | ORAL | 3 refills | Status: DC

## 2021-11-28 NOTE — Progress Notes (Signed)
Please inform the patient that a referral has been placed to nephrology because her kidney function has decreased. I've also started her on Farxiga 5 mg daily and glipizide 5 mg twice daily for her type 2 diabetes.  Please encouraged the patient increase her fluid intake,  increase her physical activities and decrease her intake of high sugary foods

## 2021-12-22 ENCOUNTER — Other Ambulatory Visit: Payer: Self-pay | Admitting: Cardiovascular Disease

## 2021-12-22 DIAGNOSIS — I10 Essential (primary) hypertension: Secondary | ICD-10-CM

## 2021-12-22 MED ORDER — SPIRONOLACTONE 25 MG PO TABS: 25.0000 mg | ORAL_TABLET | Freq: Every day | ORAL | 3 refills | Status: DC

## 2021-12-22 NOTE — Telephone Encounter (Signed)
Per Dr. Charlcie Cradle ok with continuing that dose for her

## 2021-12-22 NOTE — Addendum Note (Signed)
Addended by: Eustace Moore on: 12/22/2021 04:50 PM   Modules accepted: Orders

## 2021-12-22 NOTE — Telephone Encounter (Signed)
Patient of Dr. J. Branch. Please review for refill. Thank you!  

## 2022-03-15 ENCOUNTER — Ambulatory Visit: Payer: Medicare Other | Admitting: Family Medicine

## 2022-03-17 ENCOUNTER — Encounter: Payer: Self-pay | Admitting: Family Medicine

## 2022-03-17 ENCOUNTER — Ambulatory Visit: Payer: Medicare Other | Admitting: Family Medicine

## 2022-07-04 ENCOUNTER — Telehealth: Payer: Self-pay

## 2022-07-04 NOTE — Transitions of Care (Post Inpatient/ED Visit) (Signed)
   07/04/2022  Name: Danielle Jordan MRN: [id number] DOB: 06-21-1950  Today's TOC FU Call Status: Today's TOC FU Call Status:: Unsuccessul Call (1st Attempt) Unsuccessful Call (1st Attempt) Date: 07/04/22  Attempted to reach the patient regarding the most recent Inpatient/ED visit.  Follow Up Plan: Additional outreach attempts will be made to reach the patient to complete the Transitions of Care (Post Inpatient/ED visit) call.   Signature Agnes Lawrence, CMA (AAMA)  [facility] [phone]

## 2022-07-13 NOTE — Transitions of Care (Post Inpatient/ED Visit) (Signed)
   07/13/2022  Name: Danielle Jordan MRN: [id number] DOB: 1950/05/31  Today's TOC FU Call Status: Today's TOC FU Call Status:: Unsuccessful Call (2nd Attempt) Unsuccessful Call (1st Attempt) Date: 07/04/22 Unsuccessful Call (2nd Attempt) Date: 07/13/22  Attempted to reach the patient regarding the most recent Inpatient/ED visit.  Follow Up Plan: Additional outreach attempts will be made to reach the patient to complete the Transitions of Care (Post Inpatient/ED visit) call.   Signature Agnes Lawrence, CMA (AAMA)  [facility] [phone]

## 2022-07-17 NOTE — Transitions of Care (Post Inpatient/ED Visit) (Signed)
   07/17/2022  Name: Charleigh Holleran MRN: [id number] DOB: [birth year]  Today's TOC FU Call Status: Today's TOC FU Call Status:: Successful TOC FU Call Competed Unsuccessful Call (1st Attempt) Date: 07/04/22 Unsuccessful Call (2nd Attempt) Date: 07/13/22 [facility] FU Call Complete Date: 07/17/22  Transition Care Management Follow-up Telephone Call Date of Discharge: 07/01/22 Discharge Facility: Other ([facility]) Name of Other ([facility]) Discharge Facility: [facility] Type of Discharge: Emergency Department Reason for ED Visit: Other: How have you been since you were released from the hospital?: Better Any questions or concerns?: No  Items Reviewed: Did you receive and understand the discharge instructions provided?: Yes Medications obtained,verified, and reconciled?: Yes (Medications Reviewed) Any new allergies since your discharge?: No Dietary orders reviewed?: NA Do you have support at home?: Yes People in Home: child(ren), adult  Medications Reviewed Today: Medications Reviewed Today     Reviewed by Leigh Aurora, CMA (Certified Medical Assistant) on 07/17/22 at 1651  Med List Status: <None>   Medication Order Taking? Sig Documenting Provider Last Dose Status Informant  atorvastatin (LIPITOR) 80 MG tablet [id number] No Take 1 tablet (80 mg total) by mouth daily. Antoine Poche, MD Taking Active   chlorthalidone (HYGROTON) 25 MG tablet [id number] No Take 1 tablet (25 mg total) by mouth daily. Chilton Si, MD Taking Active   Cholecalciferol (VITAMIN D-3) 125 MCG (5000 UT) TABS [id number] No Take 1 tablet by mouth daily. [provider] Taking Active   dapagliflozin propanediol (FARXIGA) 5 MG TABS tablet [id number]  Take 1 tablet (5 mg total) by mouth daily before breakfast. Gilmore Laroche, FNP  Active   glipiZIDE (GLUCOTROL) 5 MG tablet [id number]  Take 1 tablet (5 mg total) by mouth 2 (two) times daily before a meal. Gilmore Laroche, FNP  Active    labetalol (NORMODYNE) 200 MG tablet [id number] No Take 1 tablet (200 mg total) by mouth 2 (two) times daily. Chilton Si, MD Taking Active   NIFEdipine (PROCARDIA XL/NIFEDICAL-XL) 90 MG 24 hr tablet [id number] No Take 1 tablet (90 mg total) by mouth daily. Chilton Si, MD Taking Active   spironolactone (ALDACTONE) 25 MG tablet [id number]  Take 1 tablet (25 mg total) by mouth daily. Antoine Poche, MD  Active             Home Care and Equipment/Supplies: Were Home Health Services Ordered?: NA Any new equipment or medical supplies ordered?: NA  Functional Questionnaire: Do you need assistance with bathing/showering or dressing?: No Do you need assistance with meal preparation?: No Do you need assistance with eating?: No Do you have difficulty maintaining continence: No Do you need assistance with getting out of bed/getting out of a chair/moving?: No Do you have difficulty managing or taking your medications?: No  Follow up appointments reviewed: PCP Follow-up appointment confirmed?: Yes Date of PCP follow-up appointment?: 07/18/22 Follow-up Provider: Gilmore Laroche, FNP Specialist Hospital Follow-up appointment confirmed?: NA Do you need transportation to your follow-up appointment?: No Do you understand care options if your condition(s) worsen?: Yes-patient verbalized understanding    SIGNATURE Agnes Lawrence, CMA (AAMA)  [facility] [phone]

## 2022-07-18 ENCOUNTER — Encounter: Payer: Self-pay | Admitting: Family Medicine

## 2022-07-18 ENCOUNTER — Ambulatory Visit (INDEPENDENT_AMBULATORY_CARE_PROVIDER_SITE_OTHER): Payer: 59 | Admitting: Family Medicine

## 2022-07-18 VITALS — BP 138/90 | HR 97 | Resp 16 | Ht 62.0 in | Wt 197.0 lb

## 2022-07-18 DIAGNOSIS — E118 Type 2 diabetes mellitus with unspecified complications: Secondary | ICD-10-CM

## 2022-07-18 DIAGNOSIS — I10 Essential (primary) hypertension: Secondary | ICD-10-CM

## 2022-07-18 DIAGNOSIS — E782 Mixed hyperlipidemia: Secondary | ICD-10-CM

## 2022-07-18 DIAGNOSIS — M79675 Pain in left toe(s): Secondary | ICD-10-CM

## 2022-07-18 DIAGNOSIS — Z7984 Long term (current) use of oral hypoglycemic drugs: Secondary | ICD-10-CM

## 2022-07-18 MED ORDER — DAPAGLIFLOZIN PROPANEDIOL 5 MG PO TABS
5.0000 mg | ORAL_TABLET | Freq: Every day | ORAL | 1 refills | Status: AC
Start: 2022-07-18 — End: ?

## 2022-07-18 MED ORDER — SPIRONOLACTONE 25 MG PO TABS: 25.0000 mg | ORAL_TABLET | Freq: Every day | ORAL | 1 refills | Status: DC

## 2022-07-18 MED ORDER — NIFEDIPINE ER OSMOTIC RELEASE 90 MG PO TB24: 90.0000 mg | ORAL_TABLET | Freq: Every day | ORAL | 1 refills | Status: DC

## 2022-07-18 MED ORDER — ATORVASTATIN CALCIUM 80 MG PO TABS: 80.0000 mg | ORAL_TABLET | Freq: Every day | ORAL | 1 refills | Status: DC

## 2022-07-18 MED ORDER — GLIPIZIDE 5 MG PO TABS
5.0000 mg | ORAL_TABLET | Freq: Two times a day (BID) | ORAL | 1 refills | Status: AC
Start: 2022-07-18 — End: ?

## 2022-07-18 MED ORDER — LABETALOL HCL 200 MG PO TABS
200.0000 mg | ORAL_TABLET | Freq: Two times a day (BID) | ORAL | 1 refills | Status: AC
Start: 2022-07-18 — End: ?

## 2022-07-18 MED ORDER — CHLORTHALIDONE 25 MG PO TABS: 25.0000 mg | ORAL_TABLET | Freq: Every day | ORAL | 1 refills | Status: DC

## 2022-07-18 NOTE — Progress Notes (Signed)
Established Patient Office Visit  Subjective:  Patient ID: Danielle Jordan, female    DOB: Oct 10, 1950  Age: 72 y.o. MRN: [id number]  CC:  Chief Complaint  Patient presents with   Hypertension    Has been out of all meds x 1 month    HPI Danielle Jordan is a 72 y.o. female with past medical history of hypertension, type 2 diabetes and hyperlipidemia presents for ED follow-up.  Great toe pain: The patient was seen in the ED on 07/01/2022 for left foot pain at the left first metatarsal.  She was treated for suspected gout with prednisone 40 mg for 5 days and discharged home.  X-ray of the left foot showed no acute abnormalities or erosive changes.  Today the patient denies, redness, warmth, pain at the affected site.  She is able to bear weight on the left foot.  No deformity or abnormalities noted.  Range of motion is intact.  The patient requested refills of her medication given that she has been out of her medication for a month.  Refills will be sent to her pharmacy. Past Medical History:  Diagnosis Date   Hyperlipidemia    Hypertension    Resistant hypertension    Stroke (HCC)    2018   Vitamin D deficiency disease 12/31/2018    Past Surgical History:  Procedure Laterality Date   CESAREAN SECTION      Family History  Problem Relation Age of Onset   Hypertension Mother    Hypertension Father    Arthritis Father    Diabetes Father    Bronchitis Brother        Every year   Hypertension Son    Hypertension Son    Colon cancer Neg Hx    Breast cancer Neg Hx     Social History   Socioeconomic History   Marital status: Divorced    Spouse name: Not on file   Number of children: 4   Years of education: Not on file   Highest education level: Not on file  Occupational History   Not on file  Tobacco Use   Smoking status: Never   Smokeless tobacco: Never  Vaping Use   Vaping Use: Never used  Substance and Sexual Activity   Alcohol use: No   Drug use: No   Sexual  activity: Not on file  Other Topics Concern   Not on file  Social History Narrative    Not working at this time. Has 3 boys and 1 girl. Divorced. Lives with daughter   Social Determinants of Health   Financial Resource Strain: Not on file  Food Insecurity: Not on file  Transportation Needs: Not on file  Physical Activity: Not on file  Stress: Not on file  Social Connections: Not on file  Intimate Partner Violence: Not on file    Outpatient Medications Prior to Visit  Medication Sig Dispense Refill   Cholecalciferol (VITAMIN D-3) 125 MCG (5000 UT) TABS Take 1 tablet by mouth daily.     atorvastatin (LIPITOR) 80 MG tablet Take 1 tablet (80 mg total) by mouth daily. (Patient not taking: Reported on 07/18/2022) 90 tablet 3   chlorthalidone (HYGROTON) 25 MG tablet Take 1 tablet (25 mg total) by mouth daily. (Patient not taking: Reported on 07/18/2022) 90 tablet 3   dapagliflozin propanediol (FARXIGA) 5 MG TABS tablet Take 1 tablet (5 mg total) by mouth daily before breakfast. (Patient not taking: Reported on 07/18/2022) 30 tablet 3   glipiZIDE (GLUCOTROL) 5  MG tablet Take 1 tablet (5 mg total) by mouth 2 (two) times daily before a meal. (Patient not taking: Reported on 07/18/2022) 60 tablet 3   labetalol (NORMODYNE) 200 MG tablet Take 1 tablet (200 mg total) by mouth 2 (two) times daily. (Patient not taking: Reported on 07/18/2022) 180 tablet 3   NIFEdipine (PROCARDIA XL/NIFEDICAL-XL) 90 MG 24 hr tablet Take 1 tablet (90 mg total) by mouth daily. (Patient not taking: Reported on 07/18/2022) 90 tablet 3   spironolactone (ALDACTONE) 25 MG tablet Take 1 tablet (25 mg total) by mouth daily. (Patient not taking: Reported on 07/18/2022) 30 tablet 3   No facility-administered medications prior to visit.    Allergies  Allergen Reactions   Lisinopril Cough    ROS Review of Systems  Constitutional:  Negative for chills and fever.  Eyes:  Negative for visual disturbance.  Respiratory:  Negative for chest  tightness and shortness of breath.   Neurological:  Negative for dizziness and headaches.      Objective:    Physical Exam HENT:     Head: Normocephalic.     Mouth/Throat:     Mouth: Mucous membranes are moist.  Cardiovascular:     Rate and Rhythm: Normal rate.     Heart sounds: Normal heart sounds.  Pulmonary:     Effort: Pulmonary effort is normal.     Breath sounds: Normal breath sounds.  Musculoskeletal:     Left foot: Normal range of motion. No swelling, deformity or bony tenderness.  Neurological:     Mental Status: She is alert.     BP (!) 138/90   Pulse 97   Resp 16   Ht 5\' 2"  (1.575 m)   Wt 197 lb (89.4 kg)   SpO2 94%   BMI 36.03 kg/m  Wt Readings from Last 3 Encounters:  07/18/22 197 lb (89.4 kg)  11/08/21 192 lb (87.1 kg)  07/07/21 189 lb (85.7 kg)    Lab Results  Component Value Date   TSH 1.880 11/25/2021   Lab Results  Component Value Date   WBC 10.2 11/25/2021   HGB 12.3 11/25/2021   HCT 39.6 11/25/2021   MCV 82 11/25/2021   PLT 256 11/25/2021   Lab Results  Component Value Date   NA 140 11/25/2021   K 4.1 11/25/2021   CO2 22 11/25/2021   GLUCOSE 105 (H) 11/25/2021   BUN 31 (H) 11/25/2021   CREATININE 1.56 (H) 11/25/2021   BILITOT 0.5 11/25/2021   ALKPHOS 99 11/25/2021   AST 15 11/25/2021   ALT 13 11/25/2021   PROT 7.1 11/25/2021   ALBUMIN 4.2 11/25/2021   CALCIUM 9.5 11/25/2021   ANIONGAP 9 12/20/2016   EGFR 36 (L) 11/25/2021   Lab Results  Component Value Date   CHOL 88 (L) 11/25/2021   Lab Results  Component Value Date   HDL 37 (L) 11/25/2021   Lab Results  Component Value Date   LDLCALC 38 11/25/2021   Lab Results  Component Value Date   TRIG 52 11/25/2021   Lab Results  Component Value Date   CHOLHDL 2.4 11/25/2021   Lab Results  Component Value Date   HGBA1C 7.3 (H) 11/25/2021      Assessment & Plan:  Great toe pain, left Assessment & Plan: Labs and imaging reviewed She has completed prednisone 40  mg for 5 days and reports relief of her symptoms Pending uric acid levels today   Essential (primary) hypertension -     BMP8+EGFR -  Labetalol HCl; Take 1 tablet (200 mg total) by mouth 2 (two) times daily.  Dispense: 180 tablet; Refill: 1 -     NIFEdipine ER Osmotic Release; Take 1 tablet (90 mg total) by mouth daily.  Dispense: 90 tablet; Refill: 1 -     Uric acid  Mixed hyperlipidemia -     Atorvastatin Calcium; Take 1 tablet (80 mg total) by mouth daily.  Dispense: 90 tablet; Refill: 1  Hypertension, unspecified type -     Chlorthalidone; Take 1 tablet (25 mg total) by mouth daily.  Dispense: 90 tablet; Refill: 1 -     Spironolactone; Take 1 tablet (25 mg total) by mouth daily.  Dispense: 90 tablet; Refill: 1  Type 2 diabetes mellitus with complication, without long-term current use of insulin (HCC) -     Dapagliflozin Propanediol; Take 1 tablet (5 mg total) by mouth daily before breakfast.  Dispense: 90 tablet; Refill: 1 -     glipiZIDE; Take 1 tablet (5 mg total) by mouth 2 (two) times daily before a meal.  Dispense: 180 tablet; Refill: 1    Follow-up: Return in about 3 months (around 10/18/2022).   Gilmore Laroche, FNP

## 2022-07-18 NOTE — Patient Instructions (Addendum)
I appreciate the opportunity to provide care to you today!    Follow up:  3 months  Labs: please stop by the lab today to get your blood drawn (uric acid )  Please pick up your refills at the pharmacy   Please continue to a heart-healthy diet and increase your physical activities. Try to exercise for at least five days a week.      It was a pleasure to see you and I look forward to continuing to work together on your health and well-being. Please do not hesitate to call the office if you need care or have questions about your care.   Have a wonderful day and week. With Gratitude, Gilmore Laroche MSN, FNP-BC

## 2022-07-18 NOTE — Assessment & Plan Note (Signed)
Labs and imaging reviewed She has completed prednisone 40 mg for 5 days and reports relief of her symptoms Pending uric acid levels today

## 2022-07-19 ENCOUNTER — Telehealth: Payer: Self-pay

## 2022-07-19 LAB — BMP8+EGFR
BUN/Creatinine Ratio: 14 (ref 12–28)
BUN: 18 mg/dL (ref 8–27)
CO2: 23 mmol/L (ref 20–29)
Calcium: 9.6 mg/dL (ref 8.7–10.3)
Chloride: 102 mmol/L (ref 96–106)
Creatinine, Ser: 1.3 mg/dL — ABNORMAL HIGH (ref 0.57–1.00)
Glucose: 161 mg/dL — ABNORMAL HIGH (ref 70–99)
Potassium: 4.9 mmol/L (ref 3.5–5.2)
Sodium: 138 mmol/L (ref 134–144)
eGFR: 45 mL/min/{1.73_m2} — ABNORMAL LOW (ref >59)

## 2022-07-19 LAB — URIC ACID: Uric Acid: 7 mg/dL (ref 3.0–7.2)

## 2022-07-19 NOTE — Telephone Encounter (Signed)
error 

## 2022-07-19 NOTE — Progress Notes (Signed)
Please encouraged the patient to increase her fluid consumption at least 64 ounces daily.  Her labs indicate dehydration.  Please inform the patient that her uric acid levels with within normal limits.

## 2022-08-11 ENCOUNTER — Encounter: Payer: Self-pay | Admitting: Nurse Practitioner

## 2022-08-11 ENCOUNTER — Ambulatory Visit: Payer: 59 | Attending: Nurse Practitioner | Admitting: Nurse Practitioner

## 2022-08-11 VITALS — BP 147/97 | HR 80 | Ht 62.0 in | Wt 194.4 lb

## 2022-08-11 DIAGNOSIS — Z8673 Personal history of transient ischemic attack (TIA), and cerebral infarction without residual deficits: Secondary | ICD-10-CM | POA: Diagnosis not present

## 2022-08-11 DIAGNOSIS — N183 Chronic kidney disease, stage 3 unspecified: Secondary | ICD-10-CM

## 2022-08-11 DIAGNOSIS — E785 Hyperlipidemia, unspecified: Secondary | ICD-10-CM | POA: Diagnosis not present

## 2022-08-11 DIAGNOSIS — I1A0 Resistant hypertension: Secondary | ICD-10-CM

## 2022-08-11 DIAGNOSIS — E669 Obesity, unspecified: Secondary | ICD-10-CM

## 2022-08-11 NOTE — Patient Instructions (Addendum)
Medication Instructions:  Your physician recommends that you continue on your current medications as directed. Please refer to the Current Medication list given to you today.  Labwork: none  Testing/Procedures: none  Follow-Up: Your physician recommends that you schedule a follow-up appointment in: 1 Year with Branch or Philis Nettle  Any Other Special Instructions Will Be Listed Below (If Applicable).  If top number of BP does not lower under 140 after 2 weeks please call the office or send message via MyChart.  If you need a refill on your cardiac medications before your next appointment, please call your pharmacy.

## 2022-08-11 NOTE — Progress Notes (Signed)
Cardiology Office Note:  .   Date:  08/11/2022  ID:  Danielle Jordan, DOB 1950-03-12, MRN [id number] PCP: Gilmore Laroche, FNP  [facility] Providers Cardiologist:  Dina Rich, MD    History of Present Illness: .   Danielle Jordan is a 72 y.o. female chest pain, resistant hypertension (followed at hypertension clinic with Pharm.D.), history of TIA/stroke, hyperlipidemia, CKD, who presents today for scheduled 1 year follow-up.  Negative NST in 2022. No RAS on imaging.   Last seen by Dr. Dina Rich 1 year ago.  Was overall doing well at the time.  Today she presents for follow-up. Doing well denies any acute cardiac complaints or issues. Denies any chest pain, shortness of breath, palpitations, syncope, presyncope, dizziness, orthopnea, PND, swelling or significant weight changes, acute bleeding, or claudication.   SH: Has 4 children, 6 grandchildren (age ranging from 8-19), one future grandchild on the way.   Studies Reviewed: Marland Kitchen     EKG today reveals normal sinus rhythm, 70 bpm, nonspecific ST/T wave abnormality, no acute ischemic changes.  Lexiscan 06/2020:  Lexiscan stress with less than 1 mm ST depression inferior and laterally during / after infusion. Not signiciant for ischemia Myoview scan with normal perfusion NO ischemia or scar TID noted, significance unknown. LVEF calculated at 72% with normal wall motion. This is a low risk study.  Echo 12/2016: - Left ventricle: The cavity size was normal. Wall thickness was    increased in a pattern of mild LVH. Systolic function was normal.    The estimated ejection fraction was in the range of 60% to 65%.    Wall motion was normal; there were no regional wall motion    abnormalities. Doppler parameters are consistent with abnormal    left ventricular relaxation (grade 1 diastolic dysfunction).  - Mitral valve: There was trivial regurgitation.  - Right atrium: Central venous pressure (est): 3 mm Hg.  - Tricuspid valve:  There was trivial regurgitation.  - Pulmonic valve: There was physiologic regurgitation.  - Pulmonary arteries: PA peak pressure: 28 mm Hg (S).  - Pericardium, extracardiac: There was no pericardial effusion.  Risk Assessment/Calculations:    HYPERTENSION CONTROL Vitals:   08/11/22 1550 08/11/22 1605  BP: (!) 152/98 (!) 147/97    The patient's blood pressure is elevated above target today.  In order to address the patient's elevated BP: Blood pressure will be monitored at home to determine if medication changes need to be made.; Follow up with primary care provider for management. Follows clinical pharm D (HTN clinic).          Physical Exam:   VS:  BP (!) 147/97 (BP Location: Left Arm, Patient Position: Sitting, Cuff Size: Normal)   Pulse 80   Ht 5\' 2"  (1.575 m)   Wt 194 lb 6.4 oz (88.2 kg)   SpO2 97%   BMI 35.56 kg/m    Wt Readings from Last 3 Encounters:  08/11/22 194 lb 6.4 oz (88.2 kg)  07/18/22 197 lb (89.4 kg)  11/08/21 192 lb (87.1 kg)    GEN: Obese, 72 y.o. female in no acute distress NECK: No JVD; No carotid bruits CARDIAC: S1/S2, RRR, no murmurs, rubs, gallops RESPIRATORY:  Clear to auscultation without rales, wheezing or rhonchi  ABDOMEN: Soft, non-tender, non-distended EXTREMITIES:  No edema; No deformity   ASSESSMENT AND PLAN: .    Resistant HTN Blood pressure elevated today, SBP goal less than 140.  Currently being managed by clinical Pharm.D. with hypertension clinic and PCP.  She reports to me blood pressure earlier today was 122/75.  Discussed to monitor BP at home at least 2 hours after medications and sitting for 5-10 minutes.  Given BP log and discussed to contact our office if SBP is not consistently less than 140 next 2 weeks.  She verbalized understanding. Heart healthy diet and regular cardiovascular exercise encouraged.   2. HLD LDL 11/2021 38.  Continue atorvastatin. Heart healthy diet and regular cardiovascular exercise encouraged.   3. Hx of  TIA/stroke Denies any deficits or current symptoms.  Continue current medication regimen. Heart healthy diet and regular cardiovascular exercise encouraged. Continue to follow with PCP.  4. CKD stage 3 Most recent kidney function stable.  Avoid nephrotoxic agents.  No medication changes at this time.  Continue to follow-up with PCP and nephrology.  5. Obesity Weight loss via diet and exercise encouraged. Discussed the impact being overweight would have on cardiovascular risk.  Dispo: Follow-up in 1 year with Dr. Dina Rich or APP in 1 year or sooner if anything changes.  Signed, Sharlene Dory, NP

## 2022-10-18 ENCOUNTER — Ambulatory Visit: Payer: 59 | Admitting: Family Medicine

## 2022-11-06 ENCOUNTER — Ambulatory Visit (INDEPENDENT_AMBULATORY_CARE_PROVIDER_SITE_OTHER): Payer: 59 | Admitting: Family Medicine

## 2022-11-06 ENCOUNTER — Encounter: Payer: Self-pay | Admitting: Family Medicine

## 2022-11-06 VITALS — BP 142/84 | HR 92 | Ht 62.0 in | Wt 199.0 lb

## 2022-11-06 DIAGNOSIS — Z1211 Encounter for screening for malignant neoplasm of colon: Secondary | ICD-10-CM

## 2022-11-06 DIAGNOSIS — I10 Essential (primary) hypertension: Secondary | ICD-10-CM

## 2022-11-06 DIAGNOSIS — Z7984 Long term (current) use of oral hypoglycemic drugs: Secondary | ICD-10-CM

## 2022-11-06 DIAGNOSIS — R7301 Impaired fasting glucose: Secondary | ICD-10-CM

## 2022-11-06 DIAGNOSIS — E7849 Other hyperlipidemia: Secondary | ICD-10-CM

## 2022-11-06 DIAGNOSIS — E038 Other specified hypothyroidism: Secondary | ICD-10-CM

## 2022-11-06 DIAGNOSIS — E559 Vitamin D deficiency, unspecified: Secondary | ICD-10-CM

## 2022-11-06 DIAGNOSIS — S6992XA Unspecified injury of left wrist, hand and finger(s), initial encounter: Secondary | ICD-10-CM

## 2022-11-06 DIAGNOSIS — E118 Type 2 diabetes mellitus with unspecified complications: Secondary | ICD-10-CM

## 2022-11-06 DIAGNOSIS — Z13820 Encounter for screening for osteoporosis: Secondary | ICD-10-CM

## 2022-11-06 DIAGNOSIS — Z1231 Encounter for screening mammogram for malignant neoplasm of breast: Secondary | ICD-10-CM

## 2022-11-06 MED ORDER — HYDRALAZINE HCL 10 MG PO TABS
10.0000 mg | ORAL_TABLET | Freq: Every day | ORAL | 1 refills | Status: DC
Start: 2022-11-06 — End: 2023-03-12

## 2022-11-06 NOTE — Assessment & Plan Note (Addendum)
Encouraged a heart healthy diet and to continue taking lipitor 80 mg daily Lab Results  Component Value Date   CHOL 88 (L) 11/25/2021   HDL 37 (L) 11/25/2021   LDLCALC 38 11/25/2021   TRIG 52 11/25/2021   CHOLHDL 2.4 11/25/2021

## 2022-11-06 NOTE — Patient Instructions (Addendum)
   I appreciate the opportunity to provide care to you today!    Follow up:  1 months  Fasting Labs: please stop by the lab during the week  to get your blood drawn (CBC, CMP, TSH, Lipid profile, HgA1c, Vit D)  Please schedule Medicare Annual Wellness  Please schedule Mammogram & Bone Density  Hypertension Management  Your current blood pressure is above the target goal of <140/90 mmHg. To address this, please continue taking chlorthalidone 25 mg, labetalol 200 mg BID, procardia 90 mg, spironolactone 25 mg and Hydralazine 10 mg daily  Medication Instructions: Take your blood pressure medication at the same time each day. After taking your medication, check your blood pressure at least an hour later. If your first reading is >140/90 mmHg, wait at least 10 minutes and recheck your blood pressure. Side Effects: In the initial days of therapy, you may experience dizziness or lightheadedness as your body adjusts to the lower blood pressure; this is expected. Diet and Lifestyle: Adhere to a low-sodium diet, limiting intake to less than 1500 mg daily, and increase your physical activity. Avoid over-the-counter NSAIDs such as ibuprofen and naproxen while on this medication. Hydration and Nutrition: Stay well-hydrated by drinking at least 64 ounces of water daily. Increase your servings of fruits and vegetables and avoid excessive sodium in your diet. Long-Term Considerations: Uncontrolled hypertension can increase the risk of cardiovascular diseases, including stroke, coronary artery disease, and heart failure.  Please report to the emergency department if your blood pressure exceeds 180/120 and is accompanied by symptoms such as headaches, chest pain, palpitations, blurred vision, or dizziness.     Attached with your AVS, you will find valuable resources for self-education. I highly recommend dedicating some time to thoroughly examine them.   Please continue to a heart-healthy diet and increase  your physical activities. Try to exercise for at least five days a week.    It was a pleasure to see you and I look forward to continuing to work together on your health and well-being. Please do not hesitate to call the office if you need care or have questions about your care.  In case of emergency, please visit the Emergency Department for urgent care, or contact our clinic at [phone] to schedule an appointment. We're here to help you!   Have a wonderful day and week. With Gratitude, Gilmore Laroche MSN, FNP-BC

## 2022-11-06 NOTE — Assessment & Plan Note (Addendum)
The patient's presentation is consistent with a likely left wrist strain that is resolving. There is low suspicion for fracture or dislocation at this time. She is encouraged to follow up if she experiences increased pain, swelling, decreased range of motion, tenderness, bruising, or stiffness.

## 2022-11-06 NOTE — Assessment & Plan Note (Signed)
Uncontrolled Will add hydralazine 10 mg daily to her treatment regiment  Hypertension Management  Your current blood pressure is above the target goal of <140/90 mmHg. To address this, please continue taking chlorthalidone 25 mg, labetalol 200 mg BID, procardia 90 mg, spironolactone 25 mg and Hydralazine 10 mg daily  Medication Instructions: Take your blood pressure medication at the same time each day. After taking your medication, check your blood pressure at least an hour later. If your first reading is >140/90 mmHg, wait at least 10 minutes and recheck your blood pressure. Side Effects: In the initial days of therapy, you may experience dizziness or lightheadedness as your body adjusts to the lower blood pressure; this is expected. Diet and Lifestyle: Adhere to a low-sodium diet, limiting intake to less than 1500 mg daily, and increase your physical activity. Avoid over-the-counter NSAIDs such as ibuprofen and naproxen while on this medication. Hydration and Nutrition: Stay well-hydrated by drinking at least 64 ounces of water daily. Increase your servings of fruits and vegetables and avoid excessive sodium in your diet. Long-Term Considerations: Uncontrolled hypertension can increase the risk of cardiovascular diseases, including stroke, coronary artery disease, and heart failure.  Please report to the emergency department if your blood pressure exceeds 180/120 and is accompanied by symptoms such as headaches, chest pain, palpitations, blurred vision, or dizziness.   BP Readings from Last 3 Encounters:  11/06/22 (!) 142/84  08/11/22 (!) 147/97  07/18/22 (!) 138/90

## 2022-11-06 NOTE — Assessment & Plan Note (Signed)
Encouraged to decrease her intake of high sugar foods and beverages with increase physical activity Encouraged to continue taking Farxiga 5 mg daily and glipizide 5 mg twice daily Lab Results  Component Value Date   HGBA1C 7.3 (H) 11/25/2021

## 2022-11-06 NOTE — Progress Notes (Signed)
Established Patient Office Visit  Subjective:  Patient ID: Danielle Jordan, female    DOB: 1950-05-14  Age: 72 y.o. MRN: [id number]  CC:  Chief Complaint  Patient presents with   Care Management    3 month f/u, reports a fall 5 weeks ago, was knocked over by landlords dog, had a fall and twisted left wrist.     HPI Danielle Jordan is a 72 y.o. female with past medical history of hypertension, type 2 diabetes, and hyperlipidemia presents for f/u of  chronic medical conditions.  Hypertension: She is taking chlorthalidone 25 mg daily labetalol 200 mg twice daily Procardia 90 mg daily and spironolactone 25 mg daily.  She reports ambulatory readings in the 140s systolic and 80s diastolic.  The patient is asymptomatic in the clinic and reports medication adherence  Type 2 diabetes: She takes glipizide 5 mg twice daily and Farxiga 5 mg daily.  No reports of polyuria, polyphagia, polydipsia.  Hyperlipidemia: She takes Lipitor 80 mg daily and reports treatment compliance  Left Wrist: The patient reports the onset of symptoms 3 to 4 weeks ago, noting that her dog jumped on her, causing her to fall and land on her left ribs. Initially, she experienced pain and bruising at the site of impact; however, those symptoms have since resolved. The patient denies any current pain, swelling, limited range of motion, tenderness, bruising, or stiffness in the joints of her left wrist.  Past Medical History:  Diagnosis Date   Hyperlipidemia    Hypertension    Resistant hypertension    Stroke (HCC)    2018   Vitamin D deficiency disease 12/31/2018    Past Surgical History:  Procedure Laterality Date   CESAREAN SECTION      Family History  Problem Relation Age of Onset   Hypertension Mother    Hypertension Father    Arthritis Father    Diabetes Father    Bronchitis Brother        Every year   Hypertension Son    Hypertension Son    Colon cancer Neg Hx    Breast cancer Neg Hx     Social  History   Socioeconomic History   Marital status: Divorced    Spouse name: Not on file   Number of children: 4   Years of education: Not on file   Highest education level: Not on file  Occupational History   Not on file  Tobacco Use   Smoking status: Never   Smokeless tobacco: Never  Vaping Use   Vaping status: Never Used  Substance and Sexual Activity   Alcohol use: No   Drug use: No   Sexual activity: Not on file  Other Topics Concern   Not on file  Social History Narrative    Not working at this time. Has 3 boys and 1 girl. Divorced. Lives with daughter   Social Determinants of Health   Financial Resource Strain: Not on file  Food Insecurity: Not on file  Transportation Needs: Not on file  Physical Activity: Not on file  Stress: Not on file  Social Connections: Not on file  Intimate Partner Violence: Not on file    Outpatient Medications Prior to Visit  Medication Sig Dispense Refill   atorvastatin (LIPITOR) 80 MG tablet Take 1 tablet (80 mg total) by mouth daily. 90 tablet 1   chlorthalidone (HYGROTON) 25 MG tablet Take 1 tablet (25 mg total) by mouth daily. 90 tablet 1   Cholecalciferol (VITAMIN D-3) 125  MCG (5000 UT) TABS Take 1 tablet by mouth daily.     dapagliflozin propanediol (FARXIGA) 5 MG TABS tablet Take 1 tablet (5 mg total) by mouth daily before breakfast. 90 tablet 1   glipiZIDE (GLUCOTROL) 5 MG tablet Take 1 tablet (5 mg total) by mouth 2 (two) times daily before a meal. 180 tablet 1   labetalol (NORMODYNE) 200 MG tablet Take 1 tablet (200 mg total) by mouth 2 (two) times daily. 180 tablet 1   NIFEdipine (PROCARDIA XL/NIFEDICAL-XL) 90 MG 24 hr tablet Take 1 tablet (90 mg total) by mouth daily. 90 tablet 1   spironolactone (ALDACTONE) 25 MG tablet Take 1 tablet (25 mg total) by mouth daily. 90 tablet 1   No facility-administered medications prior to visit.    Allergies  Allergen Reactions   Lisinopril Cough    ROS Review of Systems   Constitutional:  Negative for chills and fever.  Eyes:  Negative for visual disturbance.  Respiratory:  Negative for chest tightness and shortness of breath.   Neurological:  Negative for dizziness and headaches.      Objective:    Physical Exam HENT:     Head: Normocephalic.     Mouth/Throat:     Mouth: Mucous membranes are moist.  Cardiovascular:     Rate and Rhythm: Normal rate.     Heart sounds: Normal heart sounds.  Pulmonary:     Effort: Pulmonary effort is normal.     Breath sounds: Normal breath sounds.  Musculoskeletal:     Comments: Range of motion is intact in the left and right wrist with +2 radial pulse  Neurological:     Mental Status: She is alert.     BP (!) 142/84 (BP Location: Left Arm)   Pulse 92   Ht 5\' 2"  (1.575 m)   Wt 199 lb 0.6 oz (90.3 kg)   SpO2 93%   BMI 36.40 kg/m  Wt Readings from Last 3 Encounters:  11/06/22 199 lb 0.6 oz (90.3 kg)  08/11/22 194 lb 6.4 oz (88.2 kg)  07/18/22 197 lb (89.4 kg)    Lab Results  Component Value Date   TSH 1.880 11/25/2021   Lab Results  Component Value Date   WBC 10.2 11/25/2021   HGB 12.3 11/25/2021   HCT 39.6 11/25/2021   MCV 82 11/25/2021   PLT 256 11/25/2021   Lab Results  Component Value Date   NA 138 07/18/2022   K 4.9 07/18/2022   CO2 23 07/18/2022   GLUCOSE 161 (H) 07/18/2022   BUN 18 07/18/2022   CREATININE 1.30 (H) 07/18/2022   BILITOT 0.5 11/25/2021   ALKPHOS 99 11/25/2021   AST 15 11/25/2021   ALT 13 11/25/2021   PROT 7.1 11/25/2021   ALBUMIN 4.2 11/25/2021   CALCIUM 9.6 07/18/2022   ANIONGAP 9 12/20/2016   EGFR 45 (L) 07/18/2022   Lab Results  Component Value Date   CHOL 88 (L) 11/25/2021   Lab Results  Component Value Date   HDL 37 (L) 11/25/2021   Lab Results  Component Value Date   LDLCALC 38 11/25/2021   Lab Results  Component Value Date   TRIG 52 11/25/2021   Lab Results  Component Value Date   CHOLHDL 2.4 11/25/2021   Lab Results  Component Value  Date   HGBA1C 7.3 (H) 11/25/2021      Assessment & Plan:  Essential (primary) hypertension Assessment & Plan: Uncontrolled Will add hydralazine 10 mg daily to her treatment regiment  Hypertension Management  Your current blood pressure is above the target goal of <140/90 mmHg. To address this, please continue taking chlorthalidone 25 mg, labetalol 200 mg BID, procardia 90 mg, spironolactone 25 mg and Hydralazine 10 mg daily  Medication Instructions: Take your blood pressure medication at the same time each day. After taking your medication, check your blood pressure at least an hour later. If your first reading is >140/90 mmHg, wait at least 10 minutes and recheck your blood pressure. Side Effects: In the initial days of therapy, you may experience dizziness or lightheadedness as your body adjusts to the lower blood pressure; this is expected. Diet and Lifestyle: Adhere to a low-sodium diet, limiting intake to less than 1500 mg daily, and increase your physical activity. Avoid over-the-counter NSAIDs such as ibuprofen and naproxen while on this medication. Hydration and Nutrition: Stay well-hydrated by drinking at least 64 ounces of water daily. Increase your servings of fruits and vegetables and avoid excessive sodium in your diet. Long-Term Considerations: Uncontrolled hypertension can increase the risk of cardiovascular diseases, including stroke, coronary artery disease, and heart failure.  Please report to the emergency department if your blood pressure exceeds 180/120 and is accompanied by symptoms such as headaches, chest pain, palpitations, blurred vision, or dizziness.   BP Readings from Last 3 Encounters:  11/06/22 (!) 142/84  08/11/22 (!) 147/97  07/18/22 (!) 138/90     Orders: -     hydrALAZINE HCl; Take 1 tablet (10 mg total) by mouth daily.  Dispense: 60 tablet; Refill: 1  Type 2 diabetes mellitus with complication, without long-term current use of insulin  (HCC) Assessment & Plan: Encouraged to decrease her intake of high sugar foods and beverages with increase physical activity Encouraged to continue taking Farxiga 5 mg daily and glipizide 5 mg twice daily Lab Results  Component Value Date   HGBA1C 7.3 (H) 11/25/2021     Orders: -     HM Diabetes Foot Exam -     Microalbumin / creatinine urine ratio  Other hyperlipidemia Assessment & Plan: Encouraged a heart healthy diet and to continue taking lipitor 80 mg daily Lab Results  Component Value Date   CHOL 88 (L) 11/25/2021   HDL 37 (L) 11/25/2021   LDLCALC 38 11/25/2021   TRIG 52 11/25/2021   CHOLHDL 2.4 11/25/2021     Orders: -     Lipid panel -     CMP14+EGFR -     CBC with Differential/Platelet  Injury of left wrist, initial encounter Assessment & Plan: The patient's presentation is consistent with a likely left wrist strain that is resolving. There is low suspicion for fracture or dislocation at this time. She is encouraged to follow up if she experiences increased pain, swelling, decreased range of motion, tenderness, bruising, or stiffness.     Colon cancer screening -     Cologuard  Breast cancer screening by mammogram -     3D Screening Mammogram, Left and Right  Osteoporosis screening -     DG Bone Density  IFG (impaired fasting glucose) -     Hemoglobin A1c  Vitamin D deficiency -     VITAMIN D 25 Hydroxy (Vit-D Deficiency, Fractures)  Other specified hypothyroidism -     TSH + free T4  Note: This chart has been completed using Engineer, civil (consulting) software, and while attempts have been made to ensure accuracy, certain words and phrases may not be transcribed as intended.    Follow-up: Return in about  1 month (around 12/06/2022).   Gilmore Laroche, FNP

## 2022-11-12 LAB — CBC WITH DIFFERENTIAL/PLATELET
Basophils Absolute: 0 10*3/uL (ref 0.0–0.2)
Basos: 0 %
EOS (ABSOLUTE): 0.1 10*3/uL (ref 0.0–0.4)
Eos: 1 %
Hematocrit: 42.9 % (ref 34.0–46.6)
Hemoglobin: 13.4 g/dL (ref 11.1–15.9)
Immature Grans (Abs): 0 10*3/uL (ref 0.0–0.1)
Immature Granulocytes: 0 %
Lymphocytes Absolute: 2.3 10*3/uL (ref 0.7–3.1)
Lymphs: 24 %
MCH: 26.4 pg — ABNORMAL LOW (ref 26.6–33.0)
MCHC: 31.2 g/dL — ABNORMAL LOW (ref 31.5–35.7)
MCV: 85 fL (ref 79–97)
Monocytes Absolute: 0.4 10*3/uL (ref 0.1–0.9)
Monocytes: 5 %
Neutrophils Absolute: 6.6 10*3/uL (ref 1.4–7.0)
Neutrophils: 70 %
Platelets: 245 10*3/uL (ref 150–450)
RBC: 5.07 x10E6/uL (ref 3.77–5.28)
RDW: 13.5 % (ref 11.7–15.4)
WBC: 9.5 10*3/uL (ref 3.4–10.8)

## 2022-11-12 LAB — LIPID PANEL
Chol/HDL Ratio: 2.6 {ratio} (ref 0.0–4.4)
Cholesterol, Total: 95 mg/dL — ABNORMAL LOW (ref 100–199)
HDL: 37 mg/dL — ABNORMAL LOW (ref >39)
LDL Chol Calc (NIH): 42 mg/dL (ref 0–99)
Triglycerides: 75 mg/dL (ref 0–149)
VLDL Cholesterol Cal: 16 mg/dL (ref 5–40)

## 2022-11-12 LAB — CMP14+EGFR
ALT: 17 [IU]/L (ref 0–32)
AST: 18 [IU]/L (ref 0–40)
Albumin: 4.4 g/dL (ref 3.9–4.9)
Alkaline Phosphatase: 116 [IU]/L (ref 44–121)
BUN/Creatinine Ratio: 18 (ref 12–28)
BUN: 27 mg/dL (ref 8–27)
Bilirubin Total: 0.7 mg/dL (ref 0.0–1.2)
CO2: 25 mmol/L (ref 20–29)
Calcium: 9.6 mg/dL (ref 8.7–10.3)
Chloride: 101 mmol/L (ref 96–106)
Creatinine, Ser: 1.52 mg/dL — ABNORMAL HIGH (ref 0.57–1.00)
Globulin, Total: 3.1 g/dL (ref 1.5–4.5)
Glucose: 138 mg/dL — ABNORMAL HIGH (ref 70–99)
Potassium: 3.9 mmol/L (ref 3.5–5.2)
Sodium: 138 mmol/L (ref 134–144)
Total Protein: 7.5 g/dL (ref 6.0–8.5)
eGFR: 37 mL/min/{1.73_m2} — ABNORMAL LOW (ref >59)

## 2022-11-12 LAB — HEMOGLOBIN A1C
Est. average glucose Bld gHb Est-mCnc: 197 mg/dL
Hgb A1c MFr Bld: 8.5 % — ABNORMAL HIGH (ref 4.8–5.6)

## 2022-11-12 LAB — TSH+FREE T4
Free T4: 1.24 ng/dL (ref 0.82–1.77)
TSH: 1.9 u[IU]/mL (ref 0.450–4.500)

## 2022-11-12 LAB — MICROALBUMIN / CREATININE URINE RATIO
Creatinine, Urine: 101.5 mg/dL
Microalb/Creat Ratio: 54 mg/g{creat} — ABNORMAL HIGH (ref 0–29)
Microalbumin, Urine: 55 ug/mL

## 2022-11-12 LAB — VITAMIN D 25 HYDROXY (VIT D DEFICIENCY, FRACTURES): Vit D, 25-Hydroxy: 49.4 ng/mL (ref 30.0–100.0)

## 2022-11-14 ENCOUNTER — Other Ambulatory Visit: Payer: Self-pay | Admitting: Family Medicine

## 2022-11-14 ENCOUNTER — Telehealth: Payer: Self-pay | Admitting: Family Medicine

## 2022-11-14 DIAGNOSIS — E118 Type 2 diabetes mellitus with unspecified complications: Secondary | ICD-10-CM

## 2022-11-14 DIAGNOSIS — E1165 Type 2 diabetes mellitus with hyperglycemia: Secondary | ICD-10-CM

## 2022-11-14 MED ORDER — GLIPIZIDE 10 MG PO TABS
10.0000 mg | ORAL_TABLET | Freq: Two times a day (BID) | ORAL | 3 refills | Status: DC
Start: 2022-11-14 — End: 2023-03-12

## 2022-11-14 MED ORDER — EMPAGLIFLOZIN 10 MG PO TABS: 10.0000 mg | ORAL_TABLET | Freq: Every day | ORAL | 1 refills | Status: DC

## 2022-11-14 MED ORDER — RYBELSUS 3 MG PO TABS
3.0000 mg | ORAL_TABLET | Freq: Every day | ORAL | 1 refills | Status: DC
Start: 2022-11-14 — End: 2023-11-17

## 2022-11-14 NOTE — Progress Notes (Signed)
  Your hemoglobin A1c has increased to 8.5. To better manage your type 2 diabetes, I have increased your glipizide to 10 mg daily. Additionally, a prescription for Jardiance 10 mg daily and Rybelsus 3 mg daily has been sent to your pharmacy. Please discontinue taking Farxiga 5 mg daily.  I recommend decreasing your intake of high-sugar foods and beverages, along with increasing your physical activity. Also, as your lab results indicate inadequate fluid consumption, I advise increasing your fluid intake to at least 64 ounces daily.

## 2022-11-14 NOTE — Telephone Encounter (Signed)
Bhutani's office called in on patient behalf. Did receive pt referral   Needs chart notes , medication list , and lab reports   Phone [phone] Fax [phone]

## 2022-11-22 ENCOUNTER — Ambulatory Visit (HOSPITAL_COMMUNITY)
Admission: RE | Admit: 2022-11-22 | Discharge: 2022-11-22 | Disposition: A | Discharge status: Home / Self Care | Payer: 59 | Source: Ambulatory Visit | Attending: Family Medicine | Admitting: Family Medicine

## 2022-11-22 DIAGNOSIS — Z13820 Encounter for screening for osteoporosis: Secondary | ICD-10-CM | POA: Insufficient documentation

## 2022-11-22 DIAGNOSIS — R9230 Dense breasts, unspecified: Secondary | ICD-10-CM | POA: Diagnosis not present

## 2022-11-22 DIAGNOSIS — M8589 Other specified disorders of bone density and structure, multiple sites: Secondary | ICD-10-CM | POA: Diagnosis not present

## 2022-11-22 DIAGNOSIS — Z1231 Encounter for screening mammogram for malignant neoplasm of breast: Secondary | ICD-10-CM | POA: Insufficient documentation

## 2022-11-22 DIAGNOSIS — Z780 Asymptomatic menopausal state: Secondary | ICD-10-CM | POA: Insufficient documentation

## 2022-12-05 ENCOUNTER — Other Ambulatory Visit (HOSPITAL_COMMUNITY): Payer: Self-pay | Admitting: Nephrology

## 2022-12-05 DIAGNOSIS — I129 Hypertensive chronic kidney disease with stage 1 through stage 4 chronic kidney disease, or unspecified chronic kidney disease: Secondary | ICD-10-CM

## 2022-12-05 DIAGNOSIS — N1832 Chronic kidney disease, stage 3b: Secondary | ICD-10-CM

## 2022-12-06 ENCOUNTER — Encounter: Payer: Self-pay | Admitting: Family Medicine

## 2022-12-06 ENCOUNTER — Ambulatory Visit (INDEPENDENT_AMBULATORY_CARE_PROVIDER_SITE_OTHER): Payer: 59 | Admitting: Family Medicine

## 2022-12-06 VITALS — BP 136/86 | HR 94 | Ht 62.0 in | Wt 198.0 lb

## 2022-12-06 DIAGNOSIS — I10 Essential (primary) hypertension: Secondary | ICD-10-CM | POA: Diagnosis not present

## 2022-12-06 DIAGNOSIS — S99921A Unspecified injury of right foot, initial encounter: Secondary | ICD-10-CM

## 2022-12-06 NOTE — Assessment & Plan Note (Signed)
Will obtain imaging study of the right foot RICE therapy encouraged

## 2022-12-06 NOTE — Progress Notes (Signed)
Established Patient Office Visit  Subjective:  Patient ID: Danielle Jordan, female    DOB: April 21, 1950  Age: 72 y.o. MRN: [id number]  CC:  Chief Complaint  Patient presents with   Care Management    1 month f/u.    Foot Injury    Pt reports foot injury from 12/02/2022. Can fell on her right foot at the grocery store.     HPI Danielle Jordan is a 72 y.o. femalepresents for blood pressure f/u.  Hypertension:The patient's blood pressure is controlled with chlorthalidone 25 mg, labetalol 200 mg BID, Procardia 90 mg, spironolactone 25 mg, and hydralazine 10 mg daily. She reports treatment compliance and is currently asymptomatic.  Right Foot Injury:The patient reports that on 12/02/2022, while in a grocery store, she dropped a cane on her right foot. Initially, she experienced increased pain and swelling, but the pain has since subsided. However, she continues to have some swelling and an achy sensation in the affected foot. Range of motion is intact, and she can bear weight on the foot. No numbness or tingling is reported.   Past Medical History:  Diagnosis Date   Hyperlipidemia    Hypertension    Resistant hypertension    Stroke (HCC)    2018   Vitamin D deficiency disease 12/31/2018    Past Surgical History:  Procedure Laterality Date   CESAREAN SECTION      Family History  Problem Relation Age of Onset   Hypertension Mother    Hypertension Father    Arthritis Father    Diabetes Father    Bronchitis Brother        Every year   Hypertension Son    Hypertension Son    Colon cancer Neg Hx    Breast cancer Neg Hx     Social History   Socioeconomic History   Marital status: Divorced    Spouse name: Not on file   Number of children: 4   Years of education: Not on file   Highest education level: Not on file  Occupational History   Not on file  Tobacco Use   Smoking status: Never   Smokeless tobacco: Never  Vaping Use   Vaping status: Never Used  Substance and  Sexual Activity   Alcohol use: No   Drug use: No   Sexual activity: Not on file  Other Topics Concern   Not on file  Social History Narrative    Not working at this time. Has 3 boys and 1 girl. Divorced. Lives with daughter   Social Determinants of Health   Financial Resource Strain: Not on file  Food Insecurity: Not on file  Transportation Needs: Not on file  Physical Activity: Not on file  Stress: Not on file  Social Connections: Not on file  Intimate Partner Violence: Not on file    Outpatient Medications Prior to Visit  Medication Sig Dispense Refill   atorvastatin (LIPITOR) 80 MG tablet Take 1 tablet (80 mg total) by mouth daily. 90 tablet 1   chlorthalidone (HYGROTON) 25 MG tablet Take 1 tablet (25 mg total) by mouth daily. 90 tablet 1   Cholecalciferol (VITAMIN D-3) 125 MCG (5000 UT) TABS Take 1 tablet by mouth daily.     empagliflozin (JARDIANCE) 10 MG TABS tablet Take 1 tablet (10 mg total) by mouth daily. 30 tablet 1   glipiZIDE (GLUCOTROL) 10 MG tablet Take 1 tablet (10 mg total) by mouth 2 (two) times daily before a meal. 60 tablet 3  hydrALAZINE (APRESOLINE) 10 MG tablet Take 1 tablet (10 mg total) by mouth daily. 60 tablet 1   labetalol (NORMODYNE) 200 MG tablet Take 1 tablet (200 mg total) by mouth 2 (two) times daily. 180 tablet 1   NIFEdipine (PROCARDIA XL/NIFEDICAL-XL) 90 MG 24 hr tablet Take 1 tablet (90 mg total) by mouth daily. 90 tablet 1   Semaglutide (RYBELSUS) 3 MG TABS Take 1 tablet (3 mg total) by mouth daily. 30 tablet 1   spironolactone (ALDACTONE) 25 MG tablet Take 1 tablet (25 mg total) by mouth daily. 90 tablet 1   No facility-administered medications prior to visit.    Allergies  Allergen Reactions   Lisinopril Cough    ROS Review of Systems  Constitutional:  Negative for chills and fever.  Eyes:  Negative for visual disturbance.  Respiratory:  Negative for chest tightness and shortness of breath.   Musculoskeletal:        Right foot  swelling  Neurological:  Negative for dizziness and headaches.      Objective:    Physical Exam HENT:     Head: Normocephalic.     Mouth/Throat:     Mouth: Mucous membranes are moist.  Cardiovascular:     Rate and Rhythm: Normal rate.     Heart sounds: Normal heart sounds.  Pulmonary:     Effort: Pulmonary effort is normal.     Breath sounds: Normal breath sounds.  Musculoskeletal:     Right foot: Swelling present.  Neurological:     Mental Status: She is alert.     BP 136/86   Pulse 94   Ht 5\' 2"  (1.575 m)   Wt 198 lb 0.6 oz (89.8 kg)   SpO2 92%   BMI 36.22 kg/m  Wt Readings from Last 3 Encounters:  12/06/22 198 lb 0.6 oz (89.8 kg)  11/06/22 199 lb 0.6 oz (90.3 kg)  08/11/22 194 lb 6.4 oz (88.2 kg)    Lab Results  Component Value Date   TSH 1.900 11/10/2022   Lab Results  Component Value Date   WBC 9.5 11/10/2022   HGB 13.4 11/10/2022   HCT 42.9 11/10/2022   MCV 85 11/10/2022   PLT 245 11/10/2022   Lab Results  Component Value Date   NA 138 11/10/2022   K 3.9 11/10/2022   CO2 25 11/10/2022   GLUCOSE 138 (H) 11/10/2022   BUN 27 11/10/2022   CREATININE 1.52 (H) 11/10/2022   BILITOT 0.7 11/10/2022   ALKPHOS 116 11/10/2022   AST 18 11/10/2022   ALT 17 11/10/2022   PROT 7.5 11/10/2022   ALBUMIN 4.4 11/10/2022   CALCIUM 9.6 11/10/2022   ANIONGAP 9 12/20/2016   EGFR 37 (L) 11/10/2022   Lab Results  Component Value Date   CHOL 95 (L) 11/10/2022   Lab Results  Component Value Date   HDL 37 (L) 11/10/2022   Lab Results  Component Value Date   LDLCALC 42 11/10/2022   Lab Results  Component Value Date   TRIG 75 11/10/2022   Lab Results  Component Value Date   CHOLHDL 2.6 11/10/2022   Lab Results  Component Value Date   HGBA1C 8.5 (H) 11/10/2022      Assessment & Plan:  Essential (primary) hypertension Assessment & Plan: Controlled Low-sodium diet with increased physical activity encouraged Encouraged to continue taking   chlorthalidone 25 mg, labetalol 200 mg BID, Procardia 90 mg, spironolactone 25 mg, and hydralazine 10 mg daily. BP Readings from Last 3 Encounters:  12/06/22 136/86  11/06/22 (!) 142/84  08/11/22 (!) 147/97        Injury of right foot, initial encounter Assessment & Plan: Will obtain imaging study of the right foot RICE therapy encouraged  Orders: -     DG Foot Complete Right  Note: This chart has been completed using Engineer, civil (consulting) software, and while attempts have been made to ensure accuracy, certain words and phrases may not be transcribed as intended.    Follow-up: Return in about 3 months (around 03/08/2023).   Gilmore Laroche, FNP

## 2022-12-06 NOTE — Patient Instructions (Addendum)
I appreciate the opportunity to provide care to you today!    Follow up:  3 months   - please stop by [facility] to get a x-ray of the right foot   chlorthalidone 25 mg, labetalol 200 mg BID, Procardia 90 mg, spironolactone 25 mg, and hydralazine 10 mg daily..    Please continue to a heart-healthy diet and increase your physical activities. Try to exercise for at least five days a week.    It was a pleasure to see you and I look forward to continuing to work together on your health and well-being. Please do not hesitate to call the office if you need care or have questions about your care.  In case of emergency, please visit the Emergency Department for urgent care, or contact our clinic at [phone] to schedule an appointment. We're here to help you!   Have a wonderful day and week. With Gratitude, Gilmore Laroche MSN, FNP-BC

## 2022-12-06 NOTE — Assessment & Plan Note (Signed)
Controlled Low-sodium diet with increased physical activity encouraged Encouraged to continue taking  chlorthalidone 25 mg, labetalol 200 mg BID, Procardia 90 mg, spironolactone 25 mg, and hydralazine 10 mg daily. BP Readings from Last 3 Encounters:  12/06/22 136/86  11/06/22 (!) 142/84  08/11/22 (!) 147/97

## 2022-12-08 ENCOUNTER — Ambulatory Visit (HOSPITAL_COMMUNITY)
Admission: RE | Admit: 2022-12-08 | Discharge: 2022-12-08 | Disposition: A | Discharge status: Home / Self Care | Payer: 59 | Source: Ambulatory Visit | Attending: Family Medicine | Admitting: Family Medicine

## 2022-12-08 DIAGNOSIS — S99921A Unspecified injury of right foot, initial encounter: Secondary | ICD-10-CM | POA: Insufficient documentation

## 2022-12-11 ENCOUNTER — Ambulatory Visit (HOSPITAL_COMMUNITY)
Admission: RE | Admit: 2022-12-11 | Discharge: 2022-12-11 | Disposition: A | Discharge status: Home / Self Care | Payer: 59 | Source: Ambulatory Visit | Attending: Nephrology | Admitting: Nephrology

## 2022-12-11 DIAGNOSIS — N1832 Chronic kidney disease, stage 3b: Secondary | ICD-10-CM | POA: Diagnosis present

## 2022-12-11 DIAGNOSIS — I129 Hypertensive chronic kidney disease with stage 1 through stage 4 chronic kidney disease, or unspecified chronic kidney disease: Secondary | ICD-10-CM | POA: Diagnosis present

## 2022-12-31 NOTE — Progress Notes (Signed)
Please inform the patient that her DEXA scan results indicate osteopenia. I recommend a dietary intake of approximately 1200 mg of calcium daily, along with 800 international units of vitamin D. If her dietary intake of calcium is insufficient, I suggest taking supplemental elemental calcium, typically between 500 to 1000 mg per day.

## 2022-12-31 NOTE — Progress Notes (Signed)
Please inform the patient that the X-ray shows some thickening of the soft tissue over the metatarsals (the long bones in the foot) on the side view (lateral view). However, there is no sign of a recent fracture or any foreign object in the foot.  The X-ray also shows hallux valgus, which is a condition where the big toe is angled toward the second toe, often leading to a bunion. Additionally, there is osteoarthritis in the first joint of the big toe (metatarsophalangeal joint), which means some wear and tear in the joint. Lastly, there are signs of midfoot degenerative changes, indicating some deterioration in the middle part of the foot, likely due to aging or wear over time.

## 2023-01-17 ENCOUNTER — Ambulatory Visit: Payer: 59

## 2023-01-22 ENCOUNTER — Ambulatory Visit (INDEPENDENT_AMBULATORY_CARE_PROVIDER_SITE_OTHER): Payer: 59

## 2023-01-22 VITALS — Ht 62.0 in | Wt 189.0 lb

## 2023-01-22 DIAGNOSIS — Z1159 Encounter for screening for other viral diseases: Secondary | ICD-10-CM

## 2023-01-22 DIAGNOSIS — Z0000 Encounter for general adult medical examination without abnormal findings: Secondary | ICD-10-CM

## 2023-01-22 NOTE — Patient Instructions (Addendum)
Ms. Trammell , Thank you for taking time to come for your Medicare Wellness Visit. I appreciate your ongoing commitment to your health goals. Please review the following plan we discussed and let me know if I can assist you in the future.   Referrals/Orders/Follow-Ups/Clinician Recommendations:  Next Medicare Annual Wellness Visit: January 23, 2024 at 12:30 pm virtual visit  A Hepatitis C Screening has been ordered for you today. You do not have to fast to have this lab drawn.   An order has been placed for a Cologuard for you. They will mail you the kit with instructions on how to obtain the sample and send it back in to be tested. If you do not received your kit, please call our office and let us know.  You have this kit at home. Please complete it and send it back in.      This is a list of the screening recommended for you and due dates:  Health Maintenance  Topic Date Due   Hepatitis C Screening  Never done   Cologuard (Stool DNA test)  Never done   COVID-19 Vaccine (1 - 2023-24 season) Never done   Eye exam for diabetics  11/11/2022   Zoster (Shingles) Vaccine (1 of 2) 02/05/2023*   Flu Shot  05/14/2023*   Hemoglobin A1C  05/10/2023   Complete foot exam   11/06/2023   Yearly kidney function blood test for diabetes  11/10/2023   Yearly kidney health urinalysis for diabetes  11/10/2023   Mammogram  11/22/2023   Medicare Annual Wellness Visit  01/22/2024   DEXA scan (bone density measurement)  11/21/2024   DTaP/Tdap/Td vaccine (2 - Td or Tdap) 11/24/2026   Pneumonia Vaccine  Completed   HPV Vaccine  Aged Out  *Topic was postponed. The date shown is not the original due date.    Advanced directives: (Declined) Advance directive discussed with you today. Even though you declined this today, please call our office should you change your mind, and we can give you the proper paperwork for you to fill out.  Next Medicare Annual Wellness Visit scheduled for next year: Yes Preventive  Care 50 Years and Older, Female Preventive care refers to lifestyle choices and visits with your health care provider that can promote health and wellness. Preventive care visits are also called wellness exams. What can I expect for my preventive care visit? Counseling Your health care provider may ask you questions about your: Medical history, including: Past medical problems. Family medical history. Pregnancy and menstrual history. History of falls. Current health, including: Memory and ability to understand (cognition). Emotional well-being. Home life and relationship well-being. Sexual activity and sexual health. Lifestyle, including: Alcohol, nicotine or tobacco, and drug use. Access to firearms. Diet, exercise, and sleep habits. Work and work Astronomer. Sunscreen use. Safety issues such as seatbelt and bike helmet use. Physical exam Your health care provider will check your: Height and weight. These may be used to calculate your BMI (body mass index). BMI is a measurement that tells if you are at a healthy weight. Waist circumference. This measures the distance around your waistline. This measurement also tells if you are at a healthy weight and may help predict your risk of certain diseases, such as type 2 diabetes and high blood pressure. Heart rate and blood pressure. Body temperature. Skin for abnormal spots. What immunizations do I need?  Vaccines are usually given at various ages, according to a schedule. Your health care provider will recommend vaccines for  you based on your age, medical history, and lifestyle or other factors, such as travel or where you work. What tests do I need? Screening Your health care provider may recommend screening tests for certain conditions. This may include: Lipid and cholesterol levels. Hepatitis C test. Hepatitis B test. HIV (human immunodeficiency virus) test. STI (sexually transmitted infection) testing, if you are at  risk. Lung cancer screening. Colorectal cancer screening. Diabetes screening. This is done by checking your blood sugar (glucose) after you have not eaten for a while (fasting). Mammogram. Talk with your health care provider about how often you should have regular mammograms. BRCA-related cancer screening. This may be done if you have a family history of breast, ovarian, tubal, or peritoneal cancers. Bone density scan. This is done to screen for osteoporosis. Talk with your health care provider about your test results, treatment options, and if necessary, the need for more tests. Follow these instructions at home: Eating and drinking  Eat a diet that includes fresh fruits and vegetables, whole grains, lean protein, and low-fat dairy products. Limit your intake of foods with high amounts of sugar, saturated fats, and salt. Take vitamin and mineral supplements as recommended by your health care provider. Do not drink alcohol if your health care provider tells you not to drink. If you drink alcohol: Limit how much you have to 0-1 drink a day. Know how much alcohol is in your drink. In the [place] one drink equals one 12 oz bottle of beer (355 mL), one 5 oz glass of wine (148 mL), or one 1 oz glass of hard liquor (44 mL). Lifestyle Brush your teeth every morning and night with fluoride toothpaste. Floss one time each day. Exercise for at least 30 minutes 5 or more days each week. Do not use any products that contain nicotine or tobacco. These products include cigarettes, chewing tobacco, and vaping devices, such as e-cigarettes. If you need help quitting, ask your health care provider. Do not use drugs. If you are sexually active, practice safe sex. Use a condom or other form of protection in order to prevent STIs. Take aspirin only as told by your health care provider. Make sure that you understand how much to take and what form to take. Work with your health care provider to find out whether it  is safe and beneficial for you to take aspirin daily. Ask your health care provider if you need to take a cholesterol-lowering medicine (statin). Find healthy ways to manage stress, such as: Meditation, yoga, or listening to music. Journaling. Talking to a trusted person. Spending time with friends and family. Minimize exposure to UV radiation to reduce your risk of skin cancer. Safety Always wear your seat belt while driving or riding in a vehicle. Do not drive: If you have been drinking alcohol. Do not ride with someone who has been drinking. When you are tired or distracted. While texting. If you have been using any mind-altering substances or drugs. Wear a helmet and other protective equipment during sports activities. If you have firearms in your house, make sure you follow all gun safety procedures. What's next? Visit your health care provider once a year for an annual wellness visit. Ask your health care provider how often you should have your eyes and teeth checked. Stay up to date on all vaccines. This information is not intended to replace advice given to you by your health care provider. Make sure you discuss any questions you have with your health care provider. Document  Revised: 07/28/2020 Document Reviewed: 07/28/2020 Elsevier Patient Education  2024 [employer]. Understanding Your Risk for Falls Millions of people have serious injuries from falls each year. It is important to understand your risk of falling. Talk with your health care provider about your risk and what you can do to lower it. If you do have a serious fall, make sure to tell your provider. Falling once raises your risk of falling again. How can falls affect me? Serious injuries from falls are common. These include: Broken bones, such as hip fractures. Head injuries, such as traumatic brain injuries (TBI) or concussions. A fear of falling can cause you to avoid activities and stay at home. This can make  your muscles weaker and raise your risk for a fall. What can increase my risk? There are a number of risk factors that increase your risk for falling. The more risk factors you have, the higher your risk of falling. Serious injuries from a fall happen most often to people who are older than 70 years old. Teenagers and young adults ages 49-29 are also at higher risk. Common risk factors include: Weakness in the lower body. Being generally weak or confused due to long-term (chronic) illness. Dizziness or balance problems. Poor vision. Medicines that cause dizziness or drowsiness. These may include: Medicines for your blood pressure, heart, anxiety, insomnia, or swelling (edema). Pain medicines. Muscle relaxants. Other risk factors include: Drinking alcohol. Having had a fall in the past. Having foot pain or wearing improper footwear. Working at a dangerous job. Having any of the following in your home: Tripping hazards, such as floor clutter or loose rugs. Poor lighting. Pets. Having dementia or memory loss. What actions can I take to lower my risk of falling?     Physical activity Stay physically fit. Do strength and balance exercises. Consider taking a regular class to build strength and balance. Yoga and tai chi are good options. Vision Have your eyes checked every year and your prescription for glasses or contacts updated as needed. Shoes and walking aids Wear non-skid shoes. Wear shoes that have rubber soles and low heels. Do not wear high heels. Do not walk around the house in socks or slippers. Use a cane or walker as told by your provider. Home safety Attach secure railings on both sides of your stairs. Install grab bars for your bathtub, shower, and toilet. Use a non-skid mat in your bathtub or shower. Attach bath mats securely with double-sided, non-slip rug tape. Use good lighting in all rooms. Keep a flashlight near your bed. Make sure there is a clear path from your  bed to the bathroom. Use night-lights. Do not use throw rugs. Make sure all carpeting is taped or tacked down securely. Remove all clutter from walkways and stairways, including extension cords. Repair uneven or broken steps and floors. Avoid walking on icy or slippery surfaces. Walk on the grass instead of on icy or slick sidewalks. Use ice melter to get rid of ice on walkways in the winter. Use a cordless phone. Questions to ask your health care provider Can you help me check my risk for a fall? Do any of my medicines make me more likely to fall? Should I take a vitamin D supplement? What exercises can I do to improve my strength and balance? Should I make an appointment to have my vision checked? Do I need a bone density test to check for weak bones (osteoporosis)? Would it help to use a cane or a walker?  Where to find more information Centers for Disease Control and Prevention, [employer]: TonerPromos.no Community-Based Fall Prevention Programs: TonerPromos.no [employer] on Aging: BaseRingTones.pl Contact a health care provider if: You fall at home. You are afraid of falling at home. You feel weak, drowsy, or dizzy. This information is not intended to replace advice given to you by your health care provider. Make sure you discuss any questions you have with your health care provider. Document Revised: 10/03/2021 Document Reviewed: 10/03/2021 Elsevier Patient Education  2024 [employer]. Stool DNA Testing for Colon Cancer Why am I having this test? Colon cancer is one of the most common types of cancer and a leading cause of cancer-related death. Testing for colon cancer before symptoms develop (screening) can help to find the cancer early, which leads to a better chance for effective treatment. Stool DNA testing, also called the fecal immunochemical DNA test (FIT-DNA test), is one method of screening for colon cancer. All adults should have colon cancer screening starting at age 76 and continuing  until age 42. After age 109, you may no longer need screening based on your health. Your health care provider may also recommend screening before age 61. You will have tests every 1-10 years, depending on your results and the type of screening test. Screening may include having stool DNA testing every 3 years if: You have no symptoms of colon cancer. Symptoms include rectal bleeding, unexplained weight loss, and changes in bowel habits. You have an average risk of colon cancer. Average risk means: You do not have precancerous polyps. These are abnormal growths that can become cancerous (malignant). You do not have family or personal history of either colon cancer or a colon disease that increases your risk for colon cancer. You do not have a personal history of other types of cancer. You do not have diabetes. This test may be done more often for people at high risk. What is being tested? For the test, a sample of the stool is checked for blood and changes in DNA that could lead to cancer. Growths in the colon bleed and shed cells if they are cancerous or precancerous. Blood and cells can be picked up by stool as it passes through the colon. This test checks for blood cells as well as nine types of DNA (biomarkers) in three genes that have been linked to colon cancer and precancerous polyps. What kind of sample is taken?  This test uses a stool sample that is collected when you have a bowel movement. How do I collect samples at home? You will be sent a stool sample collection kit in the mail. The kit will include instructions and everything you need to get the sample. When collecting a stool (feces) sample at home, make sure you: Use supplies and instructions that you received from the lab. Have a bowel movement directly into a clean, dry container. Do not collect stool from the water in the toilet. Transfer the sample into the germ-free (sterile) cup that you received from the lab. Do not let any  toilet paper or urine get into the cup. Write your information on the label of the cup. Use ink that will not smear. To do this: Write your full legal name. Do not write a nickname. Write your date of birth. Write the date and time that you collected the sample. Return the sample to the lab as told. The sample will be checked within about 3 days of when the lab received it. The results will  be sent to your health care provider. How do I prepare for this test? There is no preparation needed for this test. However, do not collect a stool sample if: You have bleeding hemorrhoids. These are swollen veins in and around the rectum or the opening between the buttocks (anus). You have any rectal bleeding. You have a cut on your hand or finger. You have your menstrual period. You have diarrhea. How are the results reported? Your test results will be reported as either positive or negative. Sometimes, the test results may report that a condition is present when it is not present (false-positive result). Sometimes, the test results may report that a condition is not present when it is present (false-negative result). What do the results mean? A positive result means that the test found abnormal DNA or blood cells, or both. If you have a positive result, you will need to have a follow-up exam of your colon done with a scope (colonoscopy). A negative result means that no blood or changes in DNA were found. This does not guarantee that you do not have colon cancer. Your health care provider may recommend that you have other screening tests. Talk with your health care provider about what your results mean. Ask if further testing needs to be done and ask about the timing of those tests. Questions to ask your health care provider Ask your health care provider, or the department that is doing the test: When will my results be ready? How will I get my results? What are my treatment options? What other tests  do I need? What are my next steps? Summary Stool DNA testing, also called the FIT-DNA test, is one method of screening for colon cancer. For the test, a sample of the stool (feces) is checked for blood and changes in DNA that could lead to cancer. Do not collect astool sample if you have bleeding hemorrhoids, rectal bleeding, a cut on your hand or finger, your menstrual period, or diarrhea. Your test results will be reported as either positive or negative. This information is not intended to replace advice given to you by your health care provider. Make sure you discuss any questions you have with your health care provider. Document Revised: 10/30/2019 Document Reviewed: 05/21/2019 Elsevier Patient Education  2024 [employer].

## 2023-01-22 NOTE — Progress Notes (Signed)
Because this visit was a virtual/telehealth visit,  certain criteria was not obtained, such a blood pressure, CBG if applicable, and timed get up and go. Any medications not marked as "taking" were not mentioned during the medication reconciliation part of the visit. Any vitals not documented were not able to be obtained due to this being a telehealth visit or patient was unable to self-report a recent blood pressure reading due to a lack of equipment at home via telehealth. Vitals that have been documented are verbally provided by the patient.   Subjective:   Danielle Jordan is a 72 y.o. female who presents for an Initial Medicare Annual Wellness Visit.  Visit Complete: Virtual I connected with  Tonyia Carver on 01/22/23 by a audio enabled telemedicine application and verified that I am speaking with the correct person using two identifiers.  Patient Location: Home  Provider Location: Home Office  I discussed the limitations of evaluation and management by telemedicine. The patient expressed understanding and agreed to proceed.  Vital Signs: Because this visit was a virtual/telehealth visit, some criteria may be missing or patient reported. Any vitals not documented were not able to be obtained and vitals that have been documented are patient reported.  Patient Medicare AWV questionnaire was completed by the patient on na; I have confirmed that all information answered by patient is correct and no changes since this date.  Cardiac Risk Factors include: advanced age (>1men, >55 women);diabetes mellitus;dyslipidemia;hypertension;sedentary lifestyle;obesity (BMI >30kg/m2)     Objective:    Today's Vitals   01/22/23 1139  Weight: 189 lb (85.7 kg)  Height: 5\' 2"  (1.575 m)  PainSc: 0-No pain   Body mass index is 34.57 kg/m.     01/22/2023   11:39 AM 01/18/2017   10:19 PM 12/19/2016    5:00 AM 12/18/2016   10:53 PM 11/16/2016   10:34 AM 10/11/2016    9:46 AM 07/29/2016   12:32 AM   Advanced Directives  Does Patient Have a Medical Advance Directive? No No No No No No No  Would patient like information on creating a medical advance directive? No - Patient declined No - Patient declined No - Patient declined No - Patient declined No - Patient declined      Current Medications (verified) Outpatient Encounter Medications as of 01/22/2023  Medication Sig   atorvastatin (LIPITOR) 80 MG tablet Take 1 tablet (80 mg total) by mouth daily.   chlorthalidone (HYGROTON) 25 MG tablet Take 1 tablet (25 mg total) by mouth daily.   Cholecalciferol (VITAMIN D-3) 125 MCG (5000 UT) TABS Take 1 tablet by mouth daily.   empagliflozin (JARDIANCE) 10 MG TABS tablet Take 1 tablet (10 mg total) by mouth daily.   glipiZIDE (GLUCOTROL) 10 MG tablet Take 1 tablet (10 mg total) by mouth 2 (two) times daily before a meal.   hydrALAZINE (APRESOLINE) 10 MG tablet Take 1 tablet (10 mg total) by mouth daily.   labetalol (NORMODYNE) 200 MG tablet Take 1 tablet (200 mg total) by mouth 2 (two) times daily.   NIFEdipine (PROCARDIA XL/NIFEDICAL-XL) 90 MG 24 hr tablet Take 1 tablet (90 mg total) by mouth daily.   Semaglutide (RYBELSUS) 3 MG TABS Take 1 tablet (3 mg total) by mouth daily.   spironolactone (ALDACTONE) 25 MG tablet Take 1 tablet (25 mg total) by mouth daily.   No facility-administered encounter medications on file as of 01/22/2023.    Allergies (verified) Lisinopril   History: Past Medical History:  Diagnosis Date   Hyperlipidemia  Hypertension    Resistant hypertension    Stroke (HCC)    2018   Vitamin D deficiency disease 12/31/2018   Past Surgical History:  Procedure Laterality Date   CESAREAN SECTION     Family History  Problem Relation Age of Onset   Hypertension Mother    Hypertension Father    Arthritis Father    Diabetes Father    Bronchitis Brother        Every year   Hypertension Son    Hypertension Son    Colon cancer Neg Hx    Breast cancer Neg Hx     Social History   Socioeconomic History   Marital status: Divorced    Spouse name: Not on file   Number of children: 4   Years of education: Not on file   Highest education level: Not on file  Occupational History   Not on file  Tobacco Use   Smoking status: Never   Smokeless tobacco: Never  Vaping Use   Vaping status: Never Used  Substance and Sexual Activity   Alcohol use: No   Drug use: No   Sexual activity: Not on file  Other Topics Concern   Not on file  Social History Narrative    Not working at this time. Has 3 boys and 1 girl. Divorced. Lives with daughter   Social Determinants of Health   Financial Resource Strain: Low Risk  (01/22/2023)   Overall Financial Resource Strain (CARDIA)    Difficulty of Paying Living Expenses: Not very hard  Food Insecurity: No Food Insecurity (01/22/2023)   Hunger Vital Sign    Worried About Running Out of Food in the Last Year: Never true    Ran Out of Food in the Last Year: Never true  Transportation Needs: No Transportation Needs (01/22/2023)   PRAPARE - Administrator, Civil Service (Medical): No    Lack of Transportation (Non-Medical): No  Physical Activity: Inactive (01/22/2023)   Exercise Vital Sign    Days of Exercise per Week: 0 days    Minutes of Exercise per Session: 0 min  Stress: No Stress Concern Present (01/22/2023)   [employer] of Occupational Health - Occupational Stress Questionnaire    Feeling of Stress : Not at all  Social Connections: Moderately Isolated (01/22/2023)   Social Connection and Isolation Panel [NHANES]    Frequency of Communication with Friends and Family: More than three times a week    Frequency of Social Gatherings with Friends and Family: Once a week    Attends Religious Services: More than 4 times per year    Active Member of [employer] or Organizations: No    Attends Engineer, structural: Never    Marital Status: Divorced    Tobacco Counseling Counseling given:  Yes   Clinical Intake:  Pre-visit preparation completed: Yes  Pain : No/denies pain Pain Score: 0-No pain     BMI - recorded: 34.57 Nutritional Risks: None Diabetes: Yes CBG done?: No (telehealth visit) Did pt. bring in CBG monitor from home?: No  How often do you need to have someone help you when you read instructions, pamphlets, or other written materials from your doctor or pharmacy?: 1 - Never  Interpreter Needed?: No  Information entered by :: Abby Amaziah Raisanen, CMa   Activities of Daily Living    01/22/2023   11:44 AM  In your present state of health, do you have any difficulty performing the following activities:  Hearing? 0  Vision?  0  Difficulty concentrating or making decisions? 0  Walking or climbing stairs? 0  Dressing or bathing? 0  Doing errands, shopping? 0  Preparing Food and eating ? N  Using the Toilet? N  In the past six months, have you accidently leaked urine? N  Do you have problems with loss of bowel control? N  Managing your Medications? N  Managing your Finances? N  Housekeeping or managing your Housekeeping? N    Patient Care Team: Gilmore Laroche, FNP as PCP - General (Family Medicine) Wyline Mood Dorothe Pea, MD as PCP - Cardiology (Cardiology)  Indicate any recent Medical Services you may have received from other than [facility] providers in the past year (date may be approximate).     Assessment:   This is a routine wellness examination for Britten.  Hearing/Vision screen Hearing Screening - Comments:: Patient denies any hearing difficulties.   Vision Screening - Comments:: Wears rx glasses - up to date with routine eye exams     Goals Addressed             This Visit's Progress    Patient Stated       Reduce the amount of medications I'm taking       Depression Screen    01/22/2023   11:40 AM 11/06/2022    2:16 PM 11/06/2022    2:10 PM 07/18/2022    2:18 PM 11/08/2021    1:27 PM 07/07/2021    1:08 PM 06/17/2020    2:59 PM  PHQ  2/9 Scores  PHQ - 2 Score 0 2 0 2 0 0 0  PHQ- 9 Score 0 4 0 2   0    Fall Risk    01/22/2023   11:43 AM 12/06/2022    2:36 PM 11/06/2022    2:16 PM 11/06/2022    2:10 PM 07/18/2022    2:18 PM  Fall Risk   Falls in the past year? 0 0 1 0 0  Number falls in past yr: 0 0 0 0 0  Injury with Fall? 0 0 0 0 0  Risk for fall due to : No Fall Risks No Fall Risks No Fall Risks No Fall Risks   Follow up Falls prevention discussed Falls evaluation completed Falls evaluation completed Falls evaluation completed     MEDICARE RISK AT HOME: Medicare Risk at Home Any stairs in or around the home?: Yes If so, are there any without handrails?: No Home free of loose throw rugs in walkways, pet beds, electrical cords, etc?: Yes Adequate lighting in your home to reduce risk of falls?: Yes Life alert?: No Use of a cane, walker or w/c?: No Grab bars in the bathroom?: No Shower chair or bench in shower?: No Elevated toilet seat or a handicapped toilet?: No  TIMED UP AND GO:  Was the test performed? No    Cognitive Function:        01/22/2023   11:42 AM  6CIT Screen  What Year? 0 points  What month? 0 points  What time? 0 points  Count back from 20 0 points  Months in reverse 0 points  Repeat phrase 0 points  Total Score 0 points    Immunizations Immunization History  Administered Date(s) Administered   PNEUMOCOCCAL CONJUGATE-20 11/08/2021   Pneumococcal Polysaccharide-23 08/12/2006, 07/27/2016   Tdap 11/23/2016    TDAP status: Up to date  Flu Vaccine status: Due, Education has been provided regarding the importance of this vaccine. Advised may receive this vaccine  at local pharmacy or Health Dept. Aware to provide a copy of the vaccination record if obtained from local pharmacy or Health Dept. Verbalized acceptance and understanding.  Pneumococcal vaccine status: Up to date  Covid-19 vaccine status: Information provided on how to obtain vaccines.   Qualifies for Shingles Vaccine?  Yes   Zostavax completed No   Shingrix Completed?: No.    Education has been provided regarding the importance of this vaccine. Patient has been advised to call insurance company to determine out of pocket expense if they have not yet received this vaccine. Advised may also receive vaccine at local pharmacy or Health Dept. Verbalized acceptance and understanding.  Screening Tests Health Maintenance  Topic Date Due   Hepatitis C Screening  Never done   Colonoscopy  Never done   COVID-19 Vaccine (1 - 2023-24 season) Never done   OPHTHALMOLOGY EXAM  11/11/2022   Zoster Vaccines- Shingrix (1 of 2) 02/05/2023 (Originally 12/03/2002)   INFLUENZA VACCINE  05/14/2023 (Originally 09/14/2022)   HEMOGLOBIN A1C  05/10/2023   FOOT EXAM  11/06/2023   Diabetic kidney evaluation - eGFR measurement  11/10/2023   Diabetic kidney evaluation - Urine ACR  11/10/2023   MAMMOGRAM  11/22/2023   Medicare Annual Wellness (AWV)  01/22/2024   DEXA SCAN  11/21/2024   DTaP/Tdap/Td (2 - Td or Tdap) 11/24/2026   Pneumonia Vaccine 52+ Years old  Completed   HPV VACCINES  Aged Out    Health Maintenance  Health Maintenance Due  Topic Date Due   Hepatitis C Screening  Never done   Colonoscopy  Never done   COVID-19 Vaccine (1 - 2023-24 season) Never done   OPHTHALMOLOGY EXAM  11/11/2022    Colorectal Cancer Screening. Order placed by pcp on 11/06/2022  Mammogram status: Completed 11/22/2023. Repeat every year  Bone Density status: Completed 11/22/2022. Results reflect: Bone density results: OSTEOPENIA. Repeat every 2 years.  Lung Cancer Screening: (Low Dose CT Chest recommended if Age 39-80 years, 20 pack-year currently smoking OR have quit w/in 15years.) does not qualify.   Lung Cancer Screening Referral: na  Additional Screening:  Hepatitis C Screening: does qualify; Order placed 01/22/2023  Vision Screening: Recommended annual ophthalmology exams for early detection of glaucoma and other disorders of the  eye. Is the patient up to date with their annual eye exam?  Yes  Who is the provider or what is the name of the office in which the patient attends annual eye exams? [facility] If pt is not established with a provider, would they like to be referred to a provider to establish care? No .   Dental Screening: Recommended annual dental exams for proper oral hygiene  Diabetic Foot Exam: Diabetic Foot Exam: Completed 11/06/2022  Community Resource Referral / Chronic Care Management: CRR required this visit?  No   CCM required this visit?  No     Plan:     I have personally reviewed and noted the following in the patient's chart:   Medical and social history Use of alcohol, tobacco or illicit drugs  Current medications and supplements including opioid prescriptions. Patient is not currently taking opioid prescriptions. Functional ability and status Nutritional status Physical activity Advanced directives List of other physicians Hospitalizations, surgeries, and ER visits in previous 12 months Vitals Screenings to include cognitive, depression, and falls Referrals and appointments  In addition, I have reviewed and discussed with patient certain preventive protocols, quality metrics, and best practice recommendations. A written personalized care plan for  preventive services as well as general preventive health recommendations were provided to patient.     Jordan Hawks Alla Sloma, CMA   01/22/2023   After Visit Summary: (Mail) Due to this being a telephonic visit, the after visit summary with patients personalized plan was offered to patient via mail   Nurse Notes: see routing comment

## 2023-02-16 ENCOUNTER — Other Ambulatory Visit: Payer: Self-pay | Admitting: Family Medicine

## 2023-02-16 DIAGNOSIS — I10 Essential (primary) hypertension: Secondary | ICD-10-CM

## 2023-03-08 DIAGNOSIS — I129 Hypertensive chronic kidney disease with stage 1 through stage 4 chronic kidney disease, or unspecified chronic kidney disease: Secondary | ICD-10-CM | POA: Diagnosis not present

## 2023-03-08 DIAGNOSIS — R809 Proteinuria, unspecified: Secondary | ICD-10-CM | POA: Diagnosis not present

## 2023-03-08 DIAGNOSIS — N1832 Chronic kidney disease, stage 3b: Secondary | ICD-10-CM | POA: Diagnosis not present

## 2023-03-08 DIAGNOSIS — I10 Essential (primary) hypertension: Secondary | ICD-10-CM | POA: Diagnosis not present

## 2023-03-08 DIAGNOSIS — I5032 Chronic diastolic (congestive) heart failure: Secondary | ICD-10-CM | POA: Diagnosis not present

## 2023-03-12 ENCOUNTER — Telehealth (INDEPENDENT_AMBULATORY_CARE_PROVIDER_SITE_OTHER): Payer: 59 | Admitting: Family Medicine

## 2023-03-12 ENCOUNTER — Telehealth: Payer: Self-pay

## 2023-03-12 ENCOUNTER — Encounter: Payer: Self-pay | Admitting: Family Medicine

## 2023-03-12 VITALS — Ht 62.0 in | Wt 198.0 lb

## 2023-03-12 DIAGNOSIS — I10 Essential (primary) hypertension: Secondary | ICD-10-CM | POA: Diagnosis not present

## 2023-03-12 DIAGNOSIS — Z1211 Encounter for screening for malignant neoplasm of colon: Secondary | ICD-10-CM

## 2023-03-12 DIAGNOSIS — E1165 Type 2 diabetes mellitus with hyperglycemia: Secondary | ICD-10-CM

## 2023-03-12 DIAGNOSIS — Z7984 Long term (current) use of oral hypoglycemic drugs: Secondary | ICD-10-CM

## 2023-03-12 DIAGNOSIS — R7301 Impaired fasting glucose: Secondary | ICD-10-CM

## 2023-03-12 DIAGNOSIS — E038 Other specified hypothyroidism: Secondary | ICD-10-CM

## 2023-03-12 DIAGNOSIS — E559 Vitamin D deficiency, unspecified: Secondary | ICD-10-CM | POA: Diagnosis not present

## 2023-03-12 DIAGNOSIS — E782 Mixed hyperlipidemia: Secondary | ICD-10-CM

## 2023-03-12 DIAGNOSIS — E7849 Other hyperlipidemia: Secondary | ICD-10-CM

## 2023-03-12 MED ORDER — NIFEDIPINE ER OSMOTIC RELEASE 90 MG PO TB24: 90.0000 mg | ORAL_TABLET | Freq: Every day | ORAL | 1 refills | Status: DC

## 2023-03-12 MED ORDER — SPIRONOLACTONE 25 MG PO TABS: 25.0000 mg | ORAL_TABLET | Freq: Every day | ORAL | 1 refills | Status: DC

## 2023-03-12 MED ORDER — CHLORTHALIDONE 25 MG PO TABS
25.0000 mg | ORAL_TABLET | Freq: Every day | ORAL | 1 refills | Status: DC
Start: 2023-03-12 — End: 2023-12-17

## 2023-03-12 MED ORDER — EMPAGLIFLOZIN 10 MG PO TABS: 10.0000 mg | ORAL_TABLET | Freq: Every day | ORAL | 1 refills | Status: DC

## 2023-03-12 MED ORDER — ATORVASTATIN CALCIUM 80 MG PO TABS: 80.0000 mg | ORAL_TABLET | Freq: Every day | ORAL | 1 refills | Status: DC

## 2023-03-12 MED ORDER — GLIPIZIDE 10 MG PO TABS: 10.0000 mg | ORAL_TABLET | Freq: Two times a day (BID) | ORAL | 3 refills | Status: AC

## 2023-03-12 MED ORDER — HYDRALAZINE HCL 10 MG PO TABS: 10.0000 mg | ORAL_TABLET | Freq: Every day | ORAL | 1 refills | Status: DC

## 2023-03-12 NOTE — Progress Notes (Addendum)
Virtual Visit via Video Note  I connected with Derrica Jordan on 03/12/23 at  2:40 PM EST by a video enabled telemedicine application and verified that I am speaking with the correct person using two identifiers.  Patient Location: Home Provider Location: Office/Clinic  I discussed the limitations, risks, security, and privacy concerns of performing an evaluation and management service by video and the availability of in person appointments. I also discussed with the patient that there may be a patient responsible charge related to this service. The patient expressed understanding and agreed to proceed.  Subjective: PCP: Danielle Laroche, FNP  Chief Complaint  Patient presents with   Follow-up    3 mo follow up: medication refills. (Pending)  Would like to request pain medication for her side she experienced her stroke on .   Please order an eye exam here in the office as her previous physician has retired     HPI The patient is here today for a chronic care management follow-up and requests a refill of her antihypertensive medications. She complains of pain on her left side, for which she has been treating with Motrin, resulting in relief of her symptoms. No new injuries or trauma are noted. The patient reports compliance with her current treatment regimen and denies symptoms of headaches, dizziness, blurred vision, chest pain, or palpitations.   ROS: Per HPI  Current Outpatient Medications:    Cholecalciferol (VITAMIN D-3) 125 MCG (5000 UT) TABS, Take 1 tablet by mouth daily., Disp: , Rfl:    labetalol (NORMODYNE) 200 MG tablet, Take 1 tablet (200 mg total) by mouth 2 (two) times daily., Disp: 180 tablet, Rfl: 1   Semaglutide (RYBELSUS) 3 MG TABS, Take 1 tablet (3 mg total) by mouth daily., Disp: 30 tablet, Rfl: 1   atorvastatin (LIPITOR) 80 MG tablet, Take 1 tablet (80 mg total) by mouth daily., Disp: 90 tablet, Rfl: 1   chlorthalidone (HYGROTON) 25 MG tablet, Take 1 tablet (25 mg  total) by mouth daily., Disp: 90 tablet, Rfl: 1   empagliflozin (JARDIANCE) 10 MG TABS tablet, Take 1 tablet (10 mg total) by mouth daily., Disp: 90 tablet, Rfl: 1   glipiZIDE (GLUCOTROL) 10 MG tablet, Take 1 tablet (10 mg total) by mouth 2 (two) times daily before a meal., Disp: 60 tablet, Rfl: 3   hydrALAZINE (APRESOLINE) 10 MG tablet, Take 1 tablet (10 mg total) by mouth daily., Disp: 60 tablet, Rfl: 1   NIFEdipine (PROCARDIA XL/NIFEDICAL-XL) 90 MG 24 hr tablet, Take 1 tablet (90 mg total) by mouth daily., Disp: 90 tablet, Rfl: 1   spironolactone (ALDACTONE) 25 MG tablet, Take 1 tablet (25 mg total) by mouth daily., Disp: 90 tablet, Rfl: 1  Observations/Objective: Today's Vitals   03/12/23 1434  Weight: 198 lb (89.8 kg)  Height: 5\' 2"  (1.575 m)  PainSc: 0-No pain   Physical Exam No labored breathing.  Speech is clear and coherent with logical content.  Patient is alert and oriented at baseline.   Assessment and Plan: Hypertension, unspecified type -     Chlorthalidone; Take 1 tablet (25 mg total) by mouth daily.  Dispense: 90 tablet; Refill: 1 -     Spironolactone; Take 1 tablet (25 mg total) by mouth daily.  Dispense: 90 tablet; Refill: 1  Essential (primary) hypertension -     hydrALAZINE HCl; Take 1 tablet (10 mg total) by mouth daily.  Dispense: 60 tablet; Refill: 1 -     NIFEdipine ER Osmotic Release; Take 1 tablet (90  mg total) by mouth daily.  Dispense: 90 tablet; Refill: 1  Type 2 diabetes mellitus with hyperglycemia, without long-term current use of insulin (HCC) -     glipiZIDE; Take 1 tablet (10 mg total) by mouth 2 (two) times daily before a meal.  Dispense: 60 tablet; Refill: 3 -     Empagliflozin; Take 1 tablet (10 mg total) by mouth daily.  Dispense: 90 tablet; Refill: 1  Mixed hyperlipidemia -     Atorvastatin Calcium; Take 1 tablet (80 mg total) by mouth daily.  Dispense: 90 tablet; Refill: 1  IFG (impaired fasting glucose) -     Hemoglobin A1c  Vitamin D  deficiency -     VITAMIN D 25 Hydroxy (Vit-D Deficiency, Fractures)  TSH (thyroid-stimulating hormone deficiency) -     TSH + free T4  Other hyperlipidemia -     Lipid panel -     CMP14+EGFR -     CBC with Differential/Platelet  Colon cancer screening -     Cologuard    Follow Up Instructions: Return in about 4 months (around 07/10/2023).   I discussed the assessment and treatment plan with the patient. The patient was provided an opportunity to ask questions, and all were answered. The patient agreed with the plan and demonstrated an understanding of the instructions.   The patient was advised to call back or seek an in-person evaluation if the symptoms worsen or if the condition fails to improve as anticipated.  The above assessment and management plan was discussed with the patient. The patient verbalized understanding of and has agreed to the management plan.   Danielle Laroche, FNP

## 2023-03-12 NOTE — Progress Notes (Signed)
Care Guide Pharmacy Note  03/12/2023 Name: [name] MRN: [id number] DOB: 04/20/1950  Referred By: Gilmore Laroche, FNP Reason for referral: Care Coordination ([facility] Diabetes. )   Danielle Jordan is a 73 y.o. year old female who is a primary care patient of Gilmore Laroche, FNP.  Cash Dantes was referred to the pharmacist for assistance related to: DMII  Successful contact was made with the patient to discuss pharmacy services.  Patient declines engagement at this time. Contact information was provided to the patient should they wish to reach out for assistance at a later time.  [facility]  [facility], [facility] Care Management Assistant Direct Dial: [phone]  Fax: [phone]

## 2023-03-19 ENCOUNTER — Ambulatory Visit: Payer: 59

## 2023-04-04 DIAGNOSIS — I129 Hypertensive chronic kidney disease with stage 1 through stage 4 chronic kidney disease, or unspecified chronic kidney disease: Secondary | ICD-10-CM | POA: Diagnosis not present

## 2023-04-04 DIAGNOSIS — I5032 Chronic diastolic (congestive) heart failure: Secondary | ICD-10-CM | POA: Diagnosis not present

## 2023-04-04 DIAGNOSIS — E1122 Type 2 diabetes mellitus with diabetic chronic kidney disease: Secondary | ICD-10-CM | POA: Diagnosis not present

## 2023-04-04 DIAGNOSIS — R809 Proteinuria, unspecified: Secondary | ICD-10-CM | POA: Diagnosis not present

## 2023-05-02 ENCOUNTER — Telehealth: Payer: Self-pay

## 2023-05-02 NOTE — Telephone Encounter (Signed)
 Patient was identified as falling into the [place] Measure - Diabetes.   Patient was: Referred to pharmacy for chronic disease management.

## 2023-07-12 ENCOUNTER — Encounter: Payer: Self-pay | Admitting: Family Medicine

## 2023-07-12 ENCOUNTER — Ambulatory Visit (INDEPENDENT_AMBULATORY_CARE_PROVIDER_SITE_OTHER): Payer: 59 | Admitting: Family Medicine

## 2023-07-12 VITALS — BP 190/121 | HR 99 | Resp 16 | Ht 62.0 in | Wt 190.1 lb

## 2023-07-12 DIAGNOSIS — I1A0 Resistant hypertension: Secondary | ICD-10-CM

## 2023-07-12 MED ORDER — HYDRALAZINE HCL 25 MG PO TABS: 25.0000 mg | ORAL_TABLET | Freq: Every day | ORAL | 1 refills | Status: DC

## 2023-07-12 NOTE — Patient Instructions (Addendum)
 I appreciate the opportunity to provide care to you today!    Follow up:  1 month for BP  Labs: please stop by the lab during the week to get your blood drawn (CBC, CMP, TSH, Lipid profile, HgA1c, Vit D  Hypertension Management  Your current blood pressure is above the target goal of <140/90 mmHg. To address this, please start taking Start taking hydralazine  25 mg daily, labetalol  200 mg BID, Procradia 90 mg daily, spironolactone  25 mg daily, chlorthalidone  25 mg daily  Medication Instructions: Take your blood pressure medication at the same time each day. After taking your medication, check your blood pressure at least an hour later. If your first reading is >140/90 mmHg, wait at least 10 minutes and recheck your blood pressure. Side Effects: In the initial days of therapy, you may experience dizziness or lightheadedness as your body adjusts to the lower blood pressure; this is expected. Diet and Lifestyle: Adhere to a low-sodium diet, limiting intake to less than 1500 mg daily, and increase your physical activity. Avoid over-the-counter NSAIDs such as ibuprofen and naproxen while on this medication. Hydration and Nutrition: Stay well-hydrated by drinking at least 64 ounces of water daily. Increase your servings of fruits and vegetables and avoid excessive sodium in your diet. Long-Term Considerations: Uncontrolled hypertension can increase the risk of cardiovascular diseases, including stroke, coronary artery disease, and heart failure.  Please report to the emergency department if your blood pressure exceeds 180/120 and is accompanied by symptoms such as headaches, chest pain, palpitations, blurred vision, or dizziness.   Please follow up with cardiology as scheduled    Please continue to a heart-healthy diet and increase your physical activities. Try to exercise for at least five days a week.    It was a pleasure to see you and I look forward to continuing to work together on your  health and well-being. Please do not hesitate to call the office if you need care or have questions about your care.  In case of emergency, please visit the Emergency Department for urgent care, or contact our clinic at [phone] to schedule an appointment. We're here to help you!   Have a wonderful day and week. With Gratitude, Marielena Harvell MSN, FNP-BC

## 2023-07-12 NOTE — Progress Notes (Signed)
 Established Patient Office Visit  Subjective:  Patient ID: Danielle Jordan, female    DOB: [birth year]  Age: 73 y.o. MRN: [id number]  CC:  Chief Complaint  Patient presents with   Hypertension    Follow up visit     HPI Danielle Jordan is a 73 y.o. female with past medical history of  Hypertension, type 2 diabetes, hyperlipidemia presents for f/u of chronic medical conditions. For the details of today's visit, please refer to the assessment and plan.     Past Medical History:  Diagnosis Date   Hyperlipidemia    Hypertension    Resistant hypertension    Stroke (HCC)    2018   Vitamin D  deficiency disease 12/31/2018    Past Surgical History:  Procedure Laterality Date   CESAREAN SECTION      Family History  Problem Relation Age of Onset   Hypertension Mother    Hypertension Father    Arthritis Father    Diabetes Father    Bronchitis Brother        Every year   Hypertension Son    Hypertension Son    Colon cancer Neg Hx    Breast cancer Neg Hx     Social History   Socioeconomic History   Marital status: Divorced    Spouse name: Not on file   Number of children: 4   Years of education: Not on file   Highest education level: Not on file  Occupational History   Not on file  Tobacco Use   Smoking status: Never   Smokeless tobacco: Never  Vaping Use   Vaping status: Never Used  Substance and Sexual Activity   Alcohol use: No   Drug use: No   Sexual activity: Not on file  Other Topics Concern   Not on file  Social History Narrative    Not working at this time. Has 3 boys and 1 girl. Divorced. Lives with daughter   Social Drivers of Health   Financial Resource Strain: Low Risk  (01/22/2023)   Overall Financial Resource Strain (CARDIA)    Difficulty of Paying Living Expenses: Not very hard  Food Insecurity: No Food Insecurity (01/22/2023)   Hunger Vital Sign    Worried About Running Out of Food in the Last Year: Never true    Ran Out of Food in the Last  Year: Never true  Transportation Needs: No Transportation Needs (01/22/2023)   PRAPARE - Administrator, Civil Service (Medical): No    Lack of Transportation (Non-Medical): No  Physical Activity: Inactive (01/22/2023)   Exercise Vital Sign    Days of Exercise per Week: 0 days    Minutes of Exercise per Session: 0 min  Stress: No Stress Concern Present (01/22/2023)   [employer] of Occupational Health - Occupational Stress Questionnaire    Feeling of Stress : Not at all  Social Connections: Moderately Isolated (01/22/2023)   Social Connection and Isolation Panel [NHANES]    Frequency of Communication with Friends and Family: More than three times a week    Frequency of Social Gatherings with Friends and Family: Once a week    Attends Religious Services: More than 4 times per year    Active Member of [employer] or Organizations: No    Attends Banker Meetings: Never    Marital Status: Divorced  Catering manager Violence: Not At Risk (01/22/2023)   Humiliation, Afraid, Rape, and Kick questionnaire    Fear of Current or Ex-Partner: No  Emotionally Abused: No    Physically Abused: No    Sexually Abused: No    Outpatient Medications Prior to Visit  Medication Sig Dispense Refill   atorvastatin  (LIPITOR) 80 MG tablet Take 1 tablet (80 mg total) by mouth daily. 90 tablet 1   chlorthalidone  (HYGROTON ) 25 MG tablet Take 1 tablet (25 mg total) by mouth daily. 90 tablet 1   Cholecalciferol (VITAMIN D -3) 125 MCG (5000 UT) TABS Take 1 tablet by mouth daily.     empagliflozin  (JARDIANCE ) 10 MG TABS tablet Take 1 tablet (10 mg total) by mouth daily. 90 tablet 1   glipiZIDE  (GLUCOTROL ) 10 MG tablet Take 1 tablet (10 mg total) by mouth 2 (two) times daily before a meal. 60 tablet 3   labetalol  (NORMODYNE ) 200 MG tablet Take 1 tablet (200 mg total) by mouth 2 (two) times daily. 180 tablet 1   NIFEdipine  (PROCARDIA  XL/NIFEDICAL-XL) 90 MG 24 hr tablet Take 1 tablet (90 mg total)  by mouth daily. 90 tablet 1   Semaglutide  (RYBELSUS ) 3 MG TABS Take 1 tablet (3 mg total) by mouth daily. 30 tablet 1   spironolactone  (ALDACTONE ) 25 MG tablet Take 1 tablet (25 mg total) by mouth daily. 90 tablet 1   hydrALAZINE  (APRESOLINE ) 10 MG tablet Take 1 tablet (10 mg total) by mouth daily. 60 tablet 1   No facility-administered medications prior to visit.    Allergies  Allergen Reactions   Lisinopril Cough    ROS Review of Systems  Constitutional:  Negative for chills and fever.  Eyes:  Negative for visual disturbance.  Respiratory:  Negative for chest tightness and shortness of breath.   Neurological:  Negative for dizziness and headaches.      Objective:     Physical Exam HENT:     Head: Normocephalic.     Mouth/Throat:     Mouth: Mucous membranes are moist.  Cardiovascular:     Rate and Rhythm: Normal rate.     Heart sounds: Normal heart sounds.  Pulmonary:     Effort: Pulmonary effort is normal.     Breath sounds: Normal breath sounds.  Neurological:     Mental Status: She is alert.     BP (!) 190/121   Pulse 99   Resp 16   Ht 5\' 2"  (1.575 m)   Wt 190 lb 1.9 oz (86.2 kg)   SpO2 95%   BMI 34.77 kg/m  Wt Readings from Last 3 Encounters:  07/12/23 190 lb 1.9 oz (86.2 kg)  03/12/23 198 lb (89.8 kg)  01/22/23 189 lb (85.7 kg)    Lab Results  Component Value Date   TSH 1.900 11/10/2022   Lab Results  Component Value Date   WBC 9.5 11/10/2022   HGB 13.4 11/10/2022   HCT 42.9 11/10/2022   MCV 85 11/10/2022   PLT 245 11/10/2022   Lab Results  Component Value Date   NA 138 11/10/2022   K 3.9 11/10/2022   CO2 25 11/10/2022   GLUCOSE 138 (H) 11/10/2022   BUN 27 11/10/2022   CREATININE 1.52 (H) 11/10/2022   BILITOT 0.7 11/10/2022   ALKPHOS 116 11/10/2022   AST 18 11/10/2022   ALT 17 11/10/2022   PROT 7.5 11/10/2022   ALBUMIN 4.4 11/10/2022   CALCIUM  9.6 11/10/2022   ANIONGAP 9 12/20/2016   EGFR 37 (L) 11/10/2022   Lab Results   Component Value Date   CHOL 95 (L) 11/10/2022   Lab Results  Component Value Date  HDL 37 (L) 11/10/2022   Lab Results  Component Value Date   LDLCALC 42 11/10/2022   Lab Results  Component Value Date   TRIG 75 11/10/2022   Lab Results  Component Value Date   CHOLHDL 2.6 11/10/2022   Lab Results  Component Value Date   HGBA1C 8.5 (H) 11/10/2022      Assessment & Plan:  Resistant hypertension Assessment & Plan: BP remains uncontrolled in the clinic today. EKG shows sinus rhythm with nonspecific ST depression. The patient is asymptomatic during the visit. She reports compliance with her treatment regimen and is currently under the care of cardiology for resistant hypertension. The patient is encouraged to continue follow-up with cardiology. Hydralazine  will be increased to 25 mg daily, while continuing her other antihypertensive medications as prescribed. The patient is advised to report to the emergency department if she experiences severe headache unrelieved by Tylenol , chest pain, shortness of breath, palpitations, or blurred vision. All questions were answered, and the patient verbalized understanding.   Orders: -     EKG 12-Lead -     hydrALAZINE  HCl; Take 1 tablet (25 mg total) by mouth daily.  Dispense: 60 tablet; Refill: 1  Note: This chart has been completed using Engineer, civil (consulting) software, and while attempts have been made to ensure accuracy, certain words and phrases may not be transcribed as intended.    Follow-up: Return in about 1 month (around 08/12/2023).   Aruna Nestler, FNP

## 2023-07-16 NOTE — Assessment & Plan Note (Addendum)
 BP remains uncontrolled in the clinic today. EKG shows sinus rhythm with nonspecific ST depression. The patient is asymptomatic during the visit. She reports compliance with her treatment regimen and is currently under the care of cardiology for resistant hypertension. The patient is encouraged to continue follow-up with cardiology. Hydralazine  will be increased to 25 mg daily, while continuing her other antihypertensive medications as prescribed. The patient is advised to report to the emergency department if she experiences severe headache unrelieved by Tylenol , chest pain, shortness of breath, palpitations, or blurred vision. All questions were answered, and the patient verbalized understanding.

## 2023-07-16 NOTE — Assessment & Plan Note (Deleted)
 uncontrooled

## 2023-08-13 ENCOUNTER — Ambulatory Visit

## 2023-08-13 ENCOUNTER — Telehealth: Payer: Self-pay

## 2023-08-13 NOTE — Progress Notes (Signed)
 Patient is in office today for a nurse visit for Blood Pressure Check. Patient blood pressure was 149/86 rechecked reading withing five minutes, reading was 142 /86, Patient No chest pain, No shortness of breath, No dyspnea on exertion, No orthopnea, No paroxysmal nocturnal dyspnea, No edema, No palpitations, No syncope

## 2023-08-13 NOTE — Telephone Encounter (Signed)
Forwarding to provder.

## 2023-09-05 ENCOUNTER — Other Ambulatory Visit (HOSPITAL_COMMUNITY)
Admission: RE | Admit: 2023-09-05 | Discharge: 2023-09-05 | Disposition: A | Discharge status: Home / Self Care | Source: Ambulatory Visit | Attending: Nephrology | Admitting: Nephrology

## 2023-09-05 DIAGNOSIS — N189 Chronic kidney disease, unspecified: Secondary | ICD-10-CM | POA: Diagnosis not present

## 2023-09-05 LAB — RENAL FUNCTION PANEL
Albumin: 3.9 g/dL (ref 3.5–5.0)
Anion gap: 11 (ref 5–15)
BUN: 38 mg/dL — ABNORMAL HIGH (ref 8–23)
CO2: 25 mmol/L (ref 22–32)
Calcium: 9.3 mg/dL (ref 8.9–10.3)
Chloride: 99 mmol/L (ref 98–111)
Creatinine, Ser: 1.59 mg/dL — ABNORMAL HIGH (ref 0.44–1.00)
GFR, Estimated: 35 mL/min — ABNORMAL LOW (ref >60)
Glucose, Bld: 182 mg/dL — ABNORMAL HIGH (ref 70–99)
Phosphorus: 3.1 mg/dL (ref 2.5–4.6)
Potassium: 4.1 mmol/L (ref 3.5–5.1)
Sodium: 135 mmol/L (ref 135–145)

## 2023-09-05 LAB — CBC
HCT: 44.5 % (ref 36.0–46.0)
Hemoglobin: 14.4 g/dL (ref 12.0–15.0)
MCH: 27.3 pg (ref 26.0–34.0)
MCHC: 32.4 g/dL (ref 30.0–36.0)
MCV: 84.3 fL (ref 80.0–100.0)
Platelets: 258 K/uL (ref 150–400)
RBC: 5.28 MIL/uL — ABNORMAL HIGH (ref 3.87–5.11)
RDW: 14 % (ref 11.5–15.5)
WBC: 12.9 K/uL — ABNORMAL HIGH (ref 4.0–10.5)
nRBC: 0 % (ref 0.0–0.2)

## 2023-09-05 LAB — PROTEIN / CREATININE RATIO, URINE
Creatinine, Urine: 317 mg/dL
Protein Creatinine Ratio: 0.11 mg/mg{creat} (ref 0.00–0.15)
Total Protein, Urine: 36 mg/dL

## 2023-09-06 DIAGNOSIS — E1122 Type 2 diabetes mellitus with diabetic chronic kidney disease: Secondary | ICD-10-CM | POA: Diagnosis not present

## 2023-09-06 DIAGNOSIS — I5032 Chronic diastolic (congestive) heart failure: Secondary | ICD-10-CM | POA: Diagnosis not present

## 2023-09-06 DIAGNOSIS — E1129 Type 2 diabetes mellitus with other diabetic kidney complication: Secondary | ICD-10-CM | POA: Diagnosis not present

## 2023-09-06 DIAGNOSIS — E559 Vitamin D deficiency, unspecified: Secondary | ICD-10-CM | POA: Diagnosis not present

## 2023-09-11 ENCOUNTER — Ambulatory Visit: Attending: Nurse Practitioner | Admitting: Nurse Practitioner

## 2023-09-11 ENCOUNTER — Encounter: Payer: Self-pay | Admitting: Nurse Practitioner

## 2023-09-11 VITALS — BP 130/90 | HR 94 | Ht 62.0 in | Wt 185.2 lb

## 2023-09-11 DIAGNOSIS — M25571 Pain in right ankle and joints of right foot: Secondary | ICD-10-CM

## 2023-09-11 DIAGNOSIS — I1A0 Resistant hypertension: Secondary | ICD-10-CM

## 2023-09-11 DIAGNOSIS — N183 Chronic kidney disease, stage 3 unspecified: Secondary | ICD-10-CM

## 2023-09-11 DIAGNOSIS — M25572 Pain in left ankle and joints of left foot: Secondary | ICD-10-CM

## 2023-09-11 DIAGNOSIS — E785 Hyperlipidemia, unspecified: Secondary | ICD-10-CM

## 2023-09-11 DIAGNOSIS — Z8673 Personal history of transient ischemic attack (TIA), and cerebral infarction without residual deficits: Secondary | ICD-10-CM

## 2023-09-11 DIAGNOSIS — E669 Obesity, unspecified: Secondary | ICD-10-CM

## 2023-09-11 NOTE — Progress Notes (Signed)
 Cardiology Office Note:  .   Date:  09/11/2023 ID:  Danielle Jordan, DOB 03-24-1950, MRN [id number] PCP: Zarwolo, Gloria, FNP  [facility] Providers Cardiologist:  Alvan Carrier, MD    History of Present Illness: .   Danielle Jordan is a 73 y.o. female chest pain, resistant hypertension (followed at hypertension clinic with Pharm.D.), history of TIA/stroke, hyperlipidemia, CKD, who presents today for scheduled 1 year follow-up.  Negative NST in 2022. No RAS on imaging.   Last seen by Dr. Carrier Alvan 1 year ago.  Was overall doing well at the time.  08/11/2022 - Today she presents for follow-up. Doing well denies any acute cardiac complaints or issues. Denies any chest pain, shortness of breath, palpitations, syncope, presyncope, dizziness, orthopnea, PND, swelling or significant weight changes, acute bleeding, or claudication.   09/11/2023 -  Here for follow-up. She is doing well. Admits to labile BP readings. Admits to bilateral ankle pain, says these feel sore, affects her walking. Otherwise, doing well from a cardiac perspective. Denies any chest pain, shortness of breath, palpitations, syncope, presyncope, dizziness, orthopnea, PND, swelling or significant weight changes, acute bleeding, or claudication.  SH: Has 4 children, 6 grandchildren (age ranging from 8-19), one future grandchild on the way.   Studies Reviewed: SABRA     EKG is not ordered today.   Lexiscan  06/2020:  Lexiscan  stress with less than 1 mm ST depression inferior and laterally during / after infusion. Not signiciant for ischemia Myoview  scan with normal perfusion NO ischemia or scar TID noted, significance unknown. LVEF calculated at 72% with normal wall motion. This is a low risk study.  Echo 12/2016: - Left ventricle: The cavity size was normal. Wall thickness was    increased in a pattern of mild LVH. Systolic function was normal.    The estimated ejection fraction was in the range of 60% to 65%.     Wall motion was normal; there were no regional wall motion    abnormalities. Doppler parameters are consistent with abnormal    left ventricular relaxation (grade 1 diastolic dysfunction).  - Mitral valve: There was trivial regurgitation.  - Right atrium: Central venous pressure (est): 3 mm Hg.  - Tricuspid valve: There was trivial regurgitation.  - Pulmonic valve: There was physiologic regurgitation.  - Pulmonary arteries: PA peak pressure: 28 mm Hg (S).  - Pericardium, extracardiac: There was no pericardial effusion.      Physical Exam:   VS:  BP (!) 130/90   Pulse 94   Ht 5' 2 (1.575 m)   Wt 185 lb 3.2 oz (84 kg)   SpO2 100%   BMI 33.87 kg/m    Wt Readings from Last 3 Encounters:  09/11/23 185 lb 3.2 oz (84 kg)  07/12/23 190 lb 1.9 oz (86.2 kg)  03/12/23 198 lb (89.8 kg)    GEN: Obese, 73 y.o. female in no acute distress NECK: No JVD; No carotid bruits CARDIAC: S1/S2, RRR, no murmurs, rubs, gallops RESPIRATORY:  Clear to auscultation without rales, wheezing or rhonchi  ABDOMEN: Soft, non-tender, non-distended EXTREMITIES:  No edema; No deformity   ASSESSMENT AND PLAN: .    Resistant HTN SBP at goal today, SBP goal less than 140.  Being managed by PCP. Discussed to monitor BP at home at least 2 hours after medications and sitting for 5-10 minutes. No medication changes at this time. Recommend if SBP and DBP not at goal, may consider increasing hydralazine  dose .She verbalized understanding. Heart healthy diet and  regular cardiovascular exercise encouraged.   2. HLD LDL 42 10/2022.  Continue atorvastatin . Heart healthy diet and regular cardiovascular exercise encouraged.   3. Hx of TIA/stroke Denies any deficits or current symptoms.  Continue current medication regimen. Heart healthy diet and regular cardiovascular exercise encouraged. Continue to follow with PCP.  4. CKD stage 3 Most recent kidney function stable.  Avoid nephrotoxic agents.  No medication changes at this  time.  Continue to follow-up with PCP and nephrology.  5. Obesity Weight loss via diet and exercise encouraged. Discussed the impact being overweight would have on cardiovascular risk.  6. Ankle pain Etiology most likely arthritis. Recommended to f/u with PCP regarding this. She verbalized understanding.   Dispo: Follow-up in 1 year with Dr. Dorn Ross or APP in 1 year or sooner if anything changes.  Signed, Almarie Crate, NP

## 2023-09-11 NOTE — Patient Instructions (Addendum)

## 2023-10-05 ENCOUNTER — Other Ambulatory Visit: Payer: Self-pay

## 2023-10-05 NOTE — Progress Notes (Addendum)
 Pharmacy Quality Measure Review  This patient is appearing on a report for being at risk of failing the adherence measure for cholesterol (statin) and diabetes medications this calendar year.   Medication:  Atorvastatin  80 mg daily Last fill date: 06/24/2023 for 90 day supply Jardiance  10 mg aily Last fill date: 06/24/2023 for 90 day supply  Patient states that she is up-to-date on all of her prescriptions, and that her daughter is the primary person that handles refilling her medications. However, she is low on atorvastatin  and would like a prescription to be called into the pharmacy. Patient states that she does not need refills of her Jardiance  at this time.   Patient is currently out of refills. Will route to embedded pharmacist to assist in reordering the patient's atorvastatin .  Woodie Jock, PharmD PGY1 Pharmacy Resident

## 2023-10-14 ENCOUNTER — Other Ambulatory Visit: Payer: Self-pay | Admitting: Family Medicine

## 2023-10-14 DIAGNOSIS — E1165 Type 2 diabetes mellitus with hyperglycemia: Secondary | ICD-10-CM

## 2023-10-14 DIAGNOSIS — E782 Mixed hyperlipidemia: Secondary | ICD-10-CM

## 2023-10-23 ENCOUNTER — Telehealth: Payer: Self-pay

## 2023-10-23 NOTE — Progress Notes (Signed)
   10/23/2023  Patient ID: Danielle Jordan, female   DOB: 02-15-1950, 73 y.o.   MRN: [id number]  This patient is appearing on a report for being at risk of failing the adherence measure for identified medications this calendar year.   Medication Adherence Summary (STAR/HEDIS Monitoring): Adherence Category: cholesterol (statin) and diabetes    Drug Name: Atorvastatin  80 mg  Last Fill or Sold Date:10/16/2023 Days' Supply: 90   Drug Name: Atorvastatin  80 mg  Last Fill or Sold Date:10/16/2023 Days' Supply: 90     Notes: ? Adherence data pulled from pharmacy claims portal Dr. Annemarie. ? Patient education provided on importance of adherence for chronic condition management and STAR quality performance. ? Reviewed barriers to adherence: N/A. ? Plan: MyChart message sent to patient.  Dorcas Solian, PharmD Clinical Pharmacist Cell: [phone]

## 2023-11-07 ENCOUNTER — Ambulatory Visit: Admitting: Nurse Practitioner

## 2023-11-13 ENCOUNTER — Ambulatory Visit (INDEPENDENT_AMBULATORY_CARE_PROVIDER_SITE_OTHER): Admitting: Internal Medicine

## 2023-11-13 ENCOUNTER — Encounter: Payer: Self-pay | Admitting: Internal Medicine

## 2023-11-13 VITALS — BP 138/88 | HR 95 | Ht 62.0 in | Wt 189.4 lb

## 2023-11-13 DIAGNOSIS — M109 Gout, unspecified: Secondary | ICD-10-CM

## 2023-11-13 DIAGNOSIS — E559 Vitamin D deficiency, unspecified: Secondary | ICD-10-CM | POA: Diagnosis not present

## 2023-11-13 DIAGNOSIS — R7301 Impaired fasting glucose: Secondary | ICD-10-CM | POA: Diagnosis not present

## 2023-11-13 DIAGNOSIS — E038 Other specified hypothyroidism: Secondary | ICD-10-CM | POA: Diagnosis not present

## 2023-11-13 DIAGNOSIS — E7849 Other hyperlipidemia: Secondary | ICD-10-CM | POA: Diagnosis not present

## 2023-11-13 MED ORDER — PREDNISONE 20 MG PO TABS: ORAL_TABLET | ORAL | 0 refills | Status: AC

## 2023-11-13 NOTE — Progress Notes (Unsigned)
 Acute Office Visit  Subjective:    Patient ID: Danielle Jordan, female    DOB: [birth year], 73 y.o.   MRN: [id number]  Chief Complaint  Patient presents with   Foot Pain    Reports sx of foot pain, concerns about gout.    HPI Patient is in today for complaint of right ankle pain and swelling for the last 2 weeks.  She denies any recent injury.  Her pain is constant, dull, worse with movement, but has been better for the last 3 days.  She has difficulty walking due to the pain.  She has had similar swelling of the ankle the past as well.  Does not report alcohol intake.  Past Medical History:  Diagnosis Date   Hyperlipidemia    Hypertension    Resistant hypertension    Stroke (HCC)    2018   Vitamin D  deficiency disease 12/31/2018    Past Surgical History:  Procedure Laterality Date   CESAREAN SECTION      Family History  Problem Relation Age of Onset   Hypertension Mother    Hypertension Father    Arthritis Father    Diabetes Father    Bronchitis Brother        Every year   Hypertension Son    Hypertension Son    Colon cancer Neg Hx    Breast cancer Neg Hx     Social History   Socioeconomic History   Marital status: Divorced    Spouse name: Not on file   Number of children: 4   Years of education: Not on file   Highest education level: Not on file  Occupational History   Not on file  Tobacco Use   Smoking status: Never   Smokeless tobacco: Never  Vaping Use   Vaping status: Never Used  Substance and Sexual Activity   Alcohol use: No   Drug use: No   Sexual activity: Not on file  Other Topics Concern   Not on file  Social History Narrative    Not working at this time. Has 3 boys and 1 girl. Divorced. Lives with daughter   Social Drivers of Health   Financial Resource Strain: Low Risk  (01/22/2023)   Overall Financial Resource Strain (CARDIA)    Difficulty of Paying Living Expenses: Not very hard  Food Insecurity: No Food Insecurity (01/22/2023)    Hunger Vital Sign    Worried About Running Out of Food in the Last Year: Never true    Ran Out of Food in the Last Year: Never true  Transportation Needs: No Transportation Needs (01/22/2023)   PRAPARE - Administrator, Civil Service (Medical): No    Lack of Transportation (Non-Medical): No  Physical Activity: Inactive (01/22/2023)   Exercise Vital Sign    Days of Exercise per Week: 0 days    Minutes of Exercise per Session: 0 min  Stress: No Stress Concern Present (01/22/2023)   [employer] of Occupational Health - Occupational Stress Questionnaire    Feeling of Stress : Not at all  Social Connections: Moderately Isolated (01/22/2023)   Social Connection and Isolation Panel    Frequency of Communication with Friends and Family: More than three times a week    Frequency of Social Gatherings with Friends and Family: Once a week    Attends Religious Services: More than 4 times per year    Active Member of [employer] or Organizations: No    Attends Banker Meetings: Never  Marital Status: Divorced  Catering manager Violence: Not At Risk (01/22/2023)   Humiliation, Afraid, Rape, and Kick questionnaire    Fear of Current or Ex-Partner: No    Emotionally Abused: No    Physically Abused: No    Sexually Abused: No    Outpatient Medications Prior to Visit  Medication Sig Dispense Refill   atorvastatin  (LIPITOR) 80 MG tablet Take 1 tablet by mouth once daily 90 tablet 0   chlorthalidone  (HYGROTON ) 25 MG tablet Take 1 tablet (25 mg total) by mouth daily. 90 tablet 1   Cholecalciferol (VITAMIN D -3) 125 MCG (5000 UT) TABS Take 1 tablet by mouth daily.     glipiZIDE  (GLUCOTROL ) 10 MG tablet Take 1 tablet (10 mg total) by mouth 2 (two) times daily before a meal. 60 tablet 3   hydrALAZINE  (APRESOLINE ) 25 MG tablet Take 1 tablet (25 mg total) by mouth daily. 60 tablet 1   JARDIANCE  10 MG TABS tablet Take 1 tablet by mouth once daily 90 tablet 0   labetalol  (NORMODYNE ) 200  MG tablet Take 1 tablet (200 mg total) by mouth 2 (two) times daily. 180 tablet 1   NIFEdipine  (PROCARDIA  XL/NIFEDICAL-XL) 90 MG 24 hr tablet Take 1 tablet (90 mg total) by mouth daily. 90 tablet 1   Semaglutide  (RYBELSUS ) 3 MG TABS Take 1 tablet (3 mg total) by mouth daily. 30 tablet 1   spironolactone  (ALDACTONE ) 25 MG tablet Take 1 tablet (25 mg total) by mouth daily. 90 tablet 1   No facility-administered medications prior to visit.    Allergies  Allergen Reactions   Lisinopril Cough    Review of Systems  Constitutional:  Negative for chills and fever.  HENT:  Negative for congestion, sinus pressure, sinus pain and sore throat.   Eyes:  Negative for pain and discharge.  Respiratory:  Negative for cough and shortness of breath.   Cardiovascular:  Negative for chest pain and palpitations.  Gastrointestinal:  Negative for abdominal pain, diarrhea, nausea and vomiting.  Endocrine: Negative for polydipsia and polyuria.  Genitourinary:  Negative for dysuria and hematuria.  Musculoskeletal:  Positive for arthralgias (Right ankle). Negative for neck pain and neck stiffness.  Skin:  Negative for rash.  Neurological:  Negative for dizziness and weakness.  Psychiatric/Behavioral:  Negative for agitation and behavioral problems.        Objective:    Physical Exam Vitals reviewed.  Constitutional:      General: She is not in acute distress.    Appearance: She is not diaphoretic.  HENT:     Head: Normocephalic and atraumatic.     Nose: Nose normal.     Mouth/Throat:     Mouth: Mucous membranes are moist.  Eyes:     General: No scleral icterus.    Extraocular Movements: Extraocular movements intact.  Cardiovascular:     Rate and Rhythm: Normal rate and regular rhythm.     Heart sounds: Normal heart sounds. No murmur heard. Pulmonary:     Breath sounds: Normal breath sounds. No wheezing or rales.  Musculoskeletal:     Right ankle: Swelling present. Tenderness present.  Skin:     General: Skin is warm.     Findings: No rash.  Neurological:     General: No focal deficit present.     Mental Status: She is alert and oriented to person, place, and time.  Psychiatric:        Mood and Affect: Mood normal.        Behavior:  Behavior normal.     BP 138/88 (BP Location: Left Arm)   Pulse 95   Ht 5' 2 (1.575 m)   Wt 189 lb 6.4 oz (85.9 kg)   SpO2 95%   BMI 34.64 kg/m  Wt Readings from Last 3 Encounters:  11/13/23 189 lb 6.4 oz (85.9 kg)  09/11/23 185 lb 3.2 oz (84 kg)  07/12/23 190 lb 1.9 oz (86.2 kg)        Assessment & Plan:   Problem List Items Addressed This Visit       Musculoskeletal and Integument   Acute gout of right ankle - Primary   Swelling, warmth and erythema of the right ankle likely due to gout Unable to take oral NSAID and colchicine due to CKD Oral prednisone taper prescribed Check uric acid level as the swelling has improved compared to prior now- plan to add allopurinol uric acid level is elevated        Relevant Medications   predniSONE (DELTASONE) 20 MG tablet   allopurinol (ZYLOPRIM) 100 MG tablet   Other Relevant Orders   Uric acid (Completed)     Meds ordered this encounter  Medications   predniSONE (DELTASONE) 20 MG tablet    Sig: Take 2 tablets (40 mg total) by mouth daily with breakfast for 4 days, THEN 1 tablet (20 mg total) daily with breakfast for 4 days.    Dispense:  12 tablet    Refill:  0   allopurinol (ZYLOPRIM) 100 MG tablet    Sig: Take 1 tablet (100 mg total) by mouth daily.    Dispense:  30 tablet    Refill:  5     Preet Perrier MARLA Blanch, MD

## 2023-11-13 NOTE — Patient Instructions (Signed)
 Please start taking Prednisone as prescribed.  Please follow low purine diet to avoid gout flareup.

## 2023-11-14 ENCOUNTER — Ambulatory Visit: Payer: Self-pay | Admitting: Internal Medicine

## 2023-11-14 LAB — CBC WITH DIFFERENTIAL/PLATELET
Basophils Absolute: 0 x10E3/uL (ref 0.0–0.2)
Basos: 0 %
EOS (ABSOLUTE): 0.2 x10E3/uL (ref 0.0–0.4)
Eos: 1 %
Hematocrit: 44.8 % (ref 34.0–46.6)
Hemoglobin: 14.2 g/dL (ref 11.1–15.9)
Immature Grans (Abs): 0.1 x10E3/uL (ref 0.0–0.1)
Immature Granulocytes: 1 %
Lymphocytes Absolute: 2.8 x10E3/uL (ref 0.7–3.1)
Lymphs: 26 %
MCH: 26.7 pg (ref 26.6–33.0)
MCHC: 31.7 g/dL (ref 31.5–35.7)
MCV: 84 fL (ref 79–97)
Monocytes Absolute: 0.6 x10E3/uL (ref 0.1–0.9)
Monocytes: 5 %
Neutrophils Absolute: 7.4 x10E3/uL — ABNORMAL HIGH (ref 1.4–7.0)
Neutrophils: 67 %
Platelets: 274 x10E3/uL (ref 150–450)
RBC: 5.31 x10E6/uL — ABNORMAL HIGH (ref 3.77–5.28)
RDW: 14.1 % (ref 11.7–15.4)
WBC: 11 x10E3/uL — ABNORMAL HIGH (ref 3.4–10.8)

## 2023-11-14 LAB — CMP14+EGFR
ALT: 10 IU/L (ref 0–32)
AST: 13 IU/L (ref 0–40)
Albumin: 4.6 g/dL (ref 3.9–4.9)
Alkaline Phosphatase: 94 IU/L (ref 49–135)
BUN/Creatinine Ratio: 26 (ref 12–28)
BUN: 37 mg/dL — ABNORMAL HIGH (ref 8–27)
Bilirubin Total: 0.4 mg/dL (ref 0.0–1.2)
CO2: 23 mmol/L (ref 20–29)
Calcium: 9.8 mg/dL (ref 8.7–10.3)
Chloride: 100 mmol/L (ref 96–106)
Creatinine, Ser: 1.42 mg/dL — ABNORMAL HIGH (ref 0.57–1.00)
Globulin, Total: 3.3 g/dL (ref 1.5–4.5)
Glucose: 137 mg/dL — ABNORMAL HIGH (ref 70–99)
Potassium: 4.1 mmol/L (ref 3.5–5.2)
Sodium: 139 mmol/L (ref 134–144)
Total Protein: 7.9 g/dL (ref 6.0–8.5)
eGFR: 40 mL/min/1.73 — ABNORMAL LOW (ref >59)

## 2023-11-14 LAB — LIPID PANEL
Chol/HDL Ratio: 4.8 ratio — ABNORMAL HIGH (ref 0.0–4.4)
Cholesterol, Total: 186 mg/dL (ref 100–199)
HDL: 39 mg/dL — ABNORMAL LOW (ref >39)
LDL Chol Calc (NIH): 125 mg/dL — ABNORMAL HIGH (ref 0–99)
Triglycerides: 123 mg/dL (ref 0–149)
VLDL Cholesterol Cal: 22 mg/dL (ref 5–40)

## 2023-11-14 LAB — TSH+FREE T4
Free T4: 1.31 ng/dL (ref 0.82–1.77)
TSH: 1.57 u[IU]/mL (ref 0.450–4.500)

## 2023-11-14 LAB — HEMOGLOBIN A1C
Est. average glucose Bld gHb Est-mCnc: 171 mg/dL
Hgb A1c MFr Bld: 7.6 % — ABNORMAL HIGH (ref 4.8–5.6)

## 2023-11-14 LAB — URIC ACID: Uric Acid: 9.2 mg/dL — ABNORMAL HIGH (ref 3.0–7.2)

## 2023-11-14 LAB — VITAMIN D 25 HYDROXY (VIT D DEFICIENCY, FRACTURES): Vit D, 25-Hydroxy: 33.7 ng/mL (ref 30.0–100.0)

## 2023-11-14 MED ORDER — ALLOPURINOL 100 MG PO TABS: 100.0000 mg | ORAL_TABLET | Freq: Every day | ORAL | 5 refills | Status: AC

## 2023-11-16 DIAGNOSIS — M109 Gout, unspecified: Secondary | ICD-10-CM | POA: Insufficient documentation

## 2023-11-16 NOTE — Assessment & Plan Note (Signed)
 Swelling, warmth and erythema of the right ankle likely due to gout Unable to take oral NSAID and colchicine due to CKD Oral prednisone taper prescribed Check uric acid level as the swelling has improved compared to prior now- plan to add allopurinol uric acid level is elevated

## 2023-11-17 ENCOUNTER — Ambulatory Visit: Payer: Self-pay | Admitting: Family Medicine

## 2023-11-17 DIAGNOSIS — E1165 Type 2 diabetes mellitus with hyperglycemia: Secondary | ICD-10-CM

## 2023-11-17 MED ORDER — RYBELSUS 7 MG PO TABS: 7.0000 mg | ORAL_TABLET | Freq: Every day | ORAL | 1 refills | Status: AC

## 2023-12-15 ENCOUNTER — Other Ambulatory Visit: Payer: Self-pay | Admitting: Family Medicine

## 2023-12-15 DIAGNOSIS — I10 Essential (primary) hypertension: Secondary | ICD-10-CM

## 2023-12-17 ENCOUNTER — Encounter: Payer: Self-pay | Admitting: Internal Medicine

## 2023-12-17 ENCOUNTER — Ambulatory Visit: Admitting: Internal Medicine

## 2023-12-17 VITALS — BP 138/86 | HR 76 | Ht 62.0 in | Wt 193.0 lb

## 2023-12-17 DIAGNOSIS — M109 Gout, unspecified: Secondary | ICD-10-CM

## 2023-12-17 DIAGNOSIS — I10 Essential (primary) hypertension: Secondary | ICD-10-CM

## 2023-12-17 DIAGNOSIS — N1832 Chronic kidney disease, stage 3b: Secondary | ICD-10-CM | POA: Diagnosis not present

## 2023-12-17 DIAGNOSIS — I1A0 Resistant hypertension: Secondary | ICD-10-CM | POA: Diagnosis not present

## 2023-12-17 MED ORDER — CHLORTHALIDONE 25 MG PO TABS: 12.5000 mg | ORAL_TABLET | Freq: Every day | ORAL | 1 refills | Status: AC

## 2023-12-17 MED ORDER — PREDNISONE 20 MG PO TABS: 20.0000 mg | ORAL_TABLET | Freq: Every day | ORAL | 0 refills | Status: AC

## 2023-12-17 MED ORDER — HYDRALAZINE HCL 25 MG PO TABS: 25.0000 mg | ORAL_TABLET | Freq: Two times a day (BID) | ORAL | 5 refills | Status: AC

## 2023-12-17 NOTE — Progress Notes (Unsigned)
 Acute Office Visit  Subjective:    Patient ID: Danielle Jordan, female    DOB: Apr 04, 1950, 73 y.o.   MRN: [id number]  Chief Complaint  Patient presents with   Gout    Gout flare up, now better, was ongoing for 2 weeks.    HPI Patient is in today for follow up of gout. She had right ankle pain and swelling in the last visit. She had difficulty walking due to the pain, which improved with Prednisone taper. Her uric acid level was elevated. She has started taking Allopurinol now.  She denies any recent injury. She has had similar swelling of the ankle the past as well.  Does not report alcohol intake.  HTN: Her BP is WNL today. She takes Chlorthalidone  25 mg QD, spironolactone  25 mg QD, hydralazine  25 mg once daily, Labetalol  200 mg BID and nifedipine  90 mg QD currently.  Past Medical History:  Diagnosis Date   Hyperlipidemia    Hypertension    Resistant hypertension    Stroke (HCC)    2018   Vitamin D  deficiency disease 12/31/2018    Past Surgical History:  Procedure Laterality Date   CESAREAN SECTION      Family History  Problem Relation Age of Onset   Hypertension Mother    Hypertension Father    Arthritis Father    Diabetes Father    Bronchitis Brother        Every year   Hypertension Son    Hypertension Son    Colon cancer Neg Hx    Breast cancer Neg Hx     Social History   Socioeconomic History   Marital status: Divorced    Spouse name: Not on file   Number of children: 4   Years of education: Not on file   Highest education level: Not on file  Occupational History   Not on file  Tobacco Use   Smoking status: Never   Smokeless tobacco: Never  Vaping Use   Vaping status: Never Used  Substance and Sexual Activity   Alcohol use: No   Drug use: No   Sexual activity: Not on file  Other Topics Concern   Not on file  Social History Narrative    Not working at this time. Has 3 boys and 1 girl. Divorced. Lives with daughter   Social Drivers of Health    Financial Resource Strain: Low Risk  (01/22/2023)   Overall Financial Resource Strain (CARDIA)    Difficulty of Paying Living Expenses: Not very hard  Food Insecurity: No Food Insecurity (01/22/2023)   Hunger Vital Sign    Worried About Running Out of Food in the Last Year: Never true    Ran Out of Food in the Last Year: Never true  Transportation Needs: No Transportation Needs (01/22/2023)   PRAPARE - Administrator, Civil Service (Medical): No    Lack of Transportation (Non-Medical): No  Physical Activity: Inactive (01/22/2023)   Exercise Vital Sign    Days of Exercise per Week: 0 days    Minutes of Exercise per Session: 0 min  Stress: No Stress Concern Present (01/22/2023)   [employer] of Occupational Health - Occupational Stress Questionnaire    Feeling of Stress : Not at all  Social Connections: Moderately Isolated (01/22/2023)   Social Connection and Isolation Panel    Frequency of Communication with Friends and Family: More than three times a week    Frequency of Social Gatherings with Friends and Family: Once  a week    Attends Religious Services: More than 4 times per year    Active Member of Clubs or Organizations: No    Attends Banker Meetings: Never    Marital Status: Divorced  Catering Manager Violence: Not At Risk (01/22/2023)   Humiliation, Afraid, Rape, and Kick questionnaire    Fear of Current or Ex-Partner: No    Emotionally Abused: No    Physically Abused: No    Sexually Abused: No    Outpatient Medications Prior to Visit  Medication Sig Dispense Refill   allopurinol (ZYLOPRIM) 100 MG tablet Take 1 tablet (100 mg total) by mouth daily. 30 tablet 5   atorvastatin  (LIPITOR) 80 MG tablet Take 1 tablet by mouth once daily 90 tablet 0   Cholecalciferol (VITAMIN D -3) 125 MCG (5000 UT) TABS Take 1 tablet by mouth daily.     glipiZIDE  (GLUCOTROL ) 10 MG tablet Take 1 tablet (10 mg total) by mouth 2 (two) times daily before a meal. 60 tablet  3   JARDIANCE  10 MG TABS tablet Take 1 tablet by mouth once daily 90 tablet 0   labetalol  (NORMODYNE ) 200 MG tablet Take 1 tablet (200 mg total) by mouth 2 (two) times daily. 180 tablet 1   NIFEdipine  (PROCARDIA  XL/NIFEDICAL-XL) 90 MG 24 hr tablet Take 1 tablet by mouth once daily 90 tablet 0   Semaglutide  (RYBELSUS ) 7 MG TABS Take 1 tablet (7 mg total) by mouth daily. 30 tablet 1   spironolactone  (ALDACTONE ) 25 MG tablet Take 1 tablet (25 mg total) by mouth daily. 90 tablet 1   chlorthalidone  (HYGROTON ) 25 MG tablet Take 1 tablet (25 mg total) by mouth daily. 90 tablet 1   hydrALAZINE  (APRESOLINE ) 25 MG tablet Take 1 tablet (25 mg total) by mouth daily. 60 tablet 1   No facility-administered medications prior to visit.    Allergies  Allergen Reactions   Lisinopril Cough    Review of Systems  Constitutional:  Negative for chills and fever.  HENT:  Negative for congestion, sinus pressure, sinus pain and sore throat.   Eyes:  Negative for pain and discharge.  Respiratory:  Negative for cough and shortness of breath.   Cardiovascular:  Negative for chest pain and palpitations.  Gastrointestinal:  Negative for abdominal pain, diarrhea, nausea and vomiting.  Endocrine: Negative for polydipsia and polyuria.  Genitourinary:  Negative for dysuria and hematuria.  Musculoskeletal:  Positive for arthralgias (Right ankle). Negative for neck pain and neck stiffness.  Skin:  Negative for rash.  Neurological:  Negative for dizziness and weakness.  Psychiatric/Behavioral:  Negative for agitation and behavioral problems.        Objective:    Physical Exam Vitals reviewed.  Constitutional:      General: She is not in acute distress.    Appearance: She is not diaphoretic.  HENT:     Head: Normocephalic and atraumatic.     Nose: Nose normal.     Mouth/Throat:     Mouth: Mucous membranes are moist.  Eyes:     General: No scleral icterus.    Extraocular Movements: Extraocular movements  intact.  Cardiovascular:     Rate and Rhythm: Normal rate and regular rhythm.     Heart sounds: Normal heart sounds. No murmur heard. Pulmonary:     Breath sounds: Normal breath sounds. No wheezing or rales.  Musculoskeletal:     Right ankle: Swelling (Mild - improved now) present.  Skin:    General: Skin is warm.  Findings: No rash.  Neurological:     General: No focal deficit present.     Mental Status: She is alert and oriented to person, place, and time.  Psychiatric:        Mood and Affect: Mood normal.        Behavior: Behavior normal.     BP 138/86   Pulse 76   Ht 5' 2 (1.575 m)   Wt 193 lb (87.5 kg)   SpO2 96%   BMI 35.30 kg/m  Wt Readings from Last 3 Encounters:  12/17/23 193 lb (87.5 kg)  11/13/23 189 lb 6.4 oz (85.9 kg)  09/11/23 185 lb 3.2 oz (84 kg)        Assessment & Plan:   Problem List Items Addressed This Visit       Cardiovascular and Mediastinum   Resistant hypertension   BP Readings from Last 1 Encounters:  12/17/23 138/86   Well-controlled Due to episodes of gout flareup recently, will decrease dose of chlorthalidone  for now Increase dose of hydralazine  to 25 mg BID If recurrent episodes of gout, consider discontinuing chlorthalidone  and increasing dose of other antihypertensive if needed Counseled for compliance with the medications Advised DASH diet and moderate exercise/walking, at least 150 mins/week      Relevant Medications   hydrALAZINE  (APRESOLINE ) 25 MG tablet   chlorthalidone  (HYGROTON ) 25 MG tablet     Musculoskeletal and Integument   Acute gout of right ankle - Primary   Uric acid level was elevated recently, has started taking allopurinol now Swelling, warmth and erythema of the right ankle improved now Unable to take oral NSAID and colchicine due to CKD Oral prednisone taper prescribed for acute flareup, advised to take it PRN Advised to follow low purine diet        Relevant Medications   predniSONE  (DELTASONE) 20 MG tablet     Genitourinary   Chronic kidney disease, stage 3 unspecified (HCC)   Followed by nephrology Reduce dose of chlorthalidone  due to hyperuricemia/gout Increase dose of hydralazine  to 25 mg BID, continue nifedipine  90 mg QD and labetalol  200 mg twice daily for HTN Continue Jardiance          Meds ordered this encounter  Medications   predniSONE (DELTASONE) 20 MG tablet    Sig: Take 1 tablet (20 mg total) by mouth daily with breakfast.    Dispense:  10 tablet    Refill:  0   hydrALAZINE  (APRESOLINE ) 25 MG tablet    Sig: Take 1 tablet (25 mg total) by mouth 2 (two) times daily.    Dispense:  60 tablet    Refill:  5   chlorthalidone  (HYGROTON ) 25 MG tablet    Sig: Take 0.5 tablets (12.5 mg total) by mouth daily.    Dispense:  45 tablet    Refill:  1     Cherina Dhillon MARLA Blanch, MD

## 2023-12-17 NOTE — Patient Instructions (Addendum)
 Please continue taking Allopurinol regularly.  Please start taking Chlorthalidone  half tablet once daily.  Please take Hydralazine  1 tablet twice daily.  Please discuss with Dr Rachele about uric acid level as well.  Please take Magnesium  oxide 250 mg once daily.

## 2023-12-18 ENCOUNTER — Other Ambulatory Visit (HOSPITAL_COMMUNITY)
Admission: RE | Admit: 2023-12-18 | Discharge: 2023-12-18 | Disposition: A | Discharge status: Home / Self Care | Source: Ambulatory Visit | Attending: Nephrology | Admitting: Nephrology

## 2023-12-18 DIAGNOSIS — N189 Chronic kidney disease, unspecified: Secondary | ICD-10-CM | POA: Insufficient documentation

## 2023-12-18 LAB — RENAL FUNCTION PANEL
Albumin: 4.2 g/dL (ref 3.5–5.0)
Anion gap: 11 (ref 5–15)
BUN: 28 mg/dL — ABNORMAL HIGH (ref 8–23)
CO2: 25 mmol/L (ref 22–32)
Calcium: 9.5 mg/dL (ref 8.9–10.3)
Chloride: 101 mmol/L (ref 98–111)
Creatinine, Ser: 1.46 mg/dL — ABNORMAL HIGH (ref 0.44–1.00)
GFR, Estimated: 38 mL/min — ABNORMAL LOW (ref >60)
Glucose, Bld: 148 mg/dL — ABNORMAL HIGH (ref 70–99)
Phosphorus: 2.5 mg/dL (ref 2.5–4.6)
Potassium: 4.1 mmol/L (ref 3.5–5.1)
Sodium: 137 mmol/L (ref 135–145)

## 2023-12-18 LAB — PROTEIN / CREATININE RATIO, URINE
Creatinine, Urine: 91 mg/dL
Protein Creatinine Ratio: 0.29 mg/mg{creat} — ABNORMAL HIGH (ref 0.00–0.15)
Total Protein, Urine: 26 mg/dL

## 2023-12-18 LAB — CBC
HCT: 43.4 % (ref 36.0–46.0)
Hemoglobin: 13.9 g/dL (ref 12.0–15.0)
MCH: 26.7 pg (ref 26.0–34.0)
MCHC: 32 g/dL (ref 30.0–36.0)
MCV: 83.3 fL (ref 80.0–100.0)
Platelets: 265 K/uL (ref 150–400)
RBC: 5.21 MIL/uL — ABNORMAL HIGH (ref 3.87–5.11)
RDW: 14.7 % (ref 11.5–15.5)
WBC: 9.8 K/uL (ref 4.0–10.5)
nRBC: 0 % (ref 0.0–0.2)

## 2023-12-19 NOTE — Progress Notes (Signed)
 Danielle Jordan                                          MRN: [id number]   12/19/2023   The [facility] Quality Team Specialist reviewed this patient medical record for the purposes of chart review for care gap closure. The following were reviewed: chart review for care gap closure-kidney health evaluation for diabetes:eGFR  and uACR.    [facility] Quality Team

## 2023-12-20 NOTE — Assessment & Plan Note (Signed)
 BP Readings from Last 1 Encounters:  12/17/23 138/86   Well-controlled Due to episodes of gout flareup recently, will decrease dose of chlorthalidone  for now Increase dose of hydralazine  to 25 mg BID If recurrent episodes of gout, consider discontinuing chlorthalidone  and increasing dose of other antihypertensive if needed Counseled for compliance with the medications Advised DASH diet and moderate exercise/walking, at least 150 mins/week

## 2023-12-20 NOTE — Assessment & Plan Note (Signed)
 Followed by nephrology Reduce dose of chlorthalidone  due to hyperuricemia/gout Increase dose of hydralazine  to 25 mg BID, continue nifedipine  90 mg QD and labetalol  200 mg twice daily for HTN Continue Jardiance 

## 2023-12-20 NOTE — Assessment & Plan Note (Signed)
 Uric acid level was elevated recently, has started taking allopurinol now Swelling, warmth and erythema of the right ankle improved now Unable to take oral NSAID and colchicine due to CKD Oral prednisone taper prescribed for acute flareup, advised to take it PRN Advised to follow low purine diet

## 2024-02-19 ENCOUNTER — Ambulatory Visit (INDEPENDENT_AMBULATORY_CARE_PROVIDER_SITE_OTHER)

## 2024-02-19 VITALS — BP 138/84 | Ht 62.0 in | Wt 188.0 lb

## 2024-02-19 DIAGNOSIS — Z0000 Encounter for general adult medical examination without abnormal findings: Secondary | ICD-10-CM

## 2024-02-19 DIAGNOSIS — Z7984 Long term (current) use of oral hypoglycemic drugs: Secondary | ICD-10-CM

## 2024-02-19 DIAGNOSIS — E118 Type 2 diabetes mellitus with unspecified complications: Secondary | ICD-10-CM | POA: Diagnosis not present

## 2024-02-19 DIAGNOSIS — Z7985 Long-term (current) use of injectable non-insulin antidiabetic drugs: Secondary | ICD-10-CM | POA: Diagnosis not present

## 2024-02-19 DIAGNOSIS — Z0001 Encounter for general adult medical examination with abnormal findings: Secondary | ICD-10-CM

## 2024-02-19 DIAGNOSIS — Z1231 Encounter for screening mammogram for malignant neoplasm of breast: Secondary | ICD-10-CM

## 2024-02-19 DIAGNOSIS — Z1159 Encounter for screening for other viral diseases: Secondary | ICD-10-CM

## 2024-02-19 NOTE — Patient Instructions (Signed)
 Ms. Penning,  Thank you for taking the time for your Medicare Wellness Visit. I appreciate your continued commitment to your health goals. Please review the care plan we discussed, and feel free to reach out if I can assist you further.  Please note that Annual Wellness Visits do not include a physical exam. Some assessments may be limited, especially if the visit was conducted virtually. If needed, we may recommend an in-person follow-up with your provider.  Ongoing Care Seeing your primary care provider every 3 to 6 months helps us  monitor your health and provide consistent, personalized care.   1 year follow up for Medicare well visit: February 20, 2025 at 1:10 pm with medicare wellness nurse in office  Referrals If a referral was made during today's visit and you haven't received any updates within two weeks, please contact the referred provider directly to check on the status.  Mammogram at [facility] Call [phone] to schedule your screening No perfumes, lotions, or deodorants the day of your screening. You can schedule your mammogram through mychart!   Recommended Screenings:  Health Maintenance  Topic Date Due   Hepatitis C Screening  Never done   Cologuard (Stool DNA test)  Never done   Zoster (Shingles) Vaccine (1 of 2) Never done   Eye exam for diabetics  11/11/2022   COVID-19 Vaccine (1 - 2025-26 season) Never done   Complete foot exam   11/06/2023   Yearly kidney health urinalysis for diabetes  11/10/2023   Breast Cancer Screening  11/22/2023   Medicare Annual Wellness Visit  01/22/2024   Flu Shot  05/13/2024*   Hemoglobin A1C  05/12/2024   Osteoporosis screening with Bone Density Scan  11/21/2024   Yearly kidney function blood test for diabetes  12/17/2024   DTaP/Tdap/Td vaccine (2 - Td or Tdap) 11/24/2026   Pneumococcal Vaccine for age over 29  Completed   Meningitis B Vaccine  Aged Out  *Topic was postponed. The date shown is not the original due date.        02/18/2024    8:27 AM  Advanced Directives  Does Patient Have a Medical Advance Directive? No  Would patient like information on creating a medical advance directive? No - Patient declined    Vision: Annual vision screenings are recommended for early detection of glaucoma, cataracts, and diabetic retinopathy. These exams can also reveal signs of chronic conditions such as diabetes and high blood pressure.  Dental: Annual dental screenings help detect early signs of oral cancer, gum disease, and other conditions linked to overall health, including heart disease and diabetes.  Please see the attached documents for additional preventive care recommendations.

## 2024-02-19 NOTE — Progress Notes (Signed)
 " HM Addressed: Mammogram ordered Diabetic Foot Exam recommended Labs Due Urine ACR, Hep C Patient needs a handicap placard. Please call her or her daughter when it is ready to pick up.  Chief Complaint  Patient presents with   Medicare Wellness     Subjective:   Danielle Jordan is a 74 y.o. female who presents for a Medicare Annual Wellness Visit.  Visit info / Clinical Intake: Medicare Wellness Visit Type:: Subsequent Annual Wellness Visit Persons participating in visit and providing information:: patient Medicare Wellness Visit Mode:: Video Since this visit was completed virtually, some vitals may be partially provided or unavailable. Missing vitals are due to the limitations of the virtual format.: Documented vitals are patient reported If Telephone or Video please confirm:: I connected with patient using audio/video enable telemedicine. I verified patient identity with two identifiers, discussed telehealth limitations, and patient agreed to proceed. Patient Location:: home Provider Location:: office Interpreter Needed?: No Pre-visit prep was completed: yes AWV questionnaire completed by patient prior to visit?: yes Date:: 02/18/24 Living arrangements:: (Patient-Rptd) with family/others Patient's Overall Health Status Rating: (Patient-Rptd) good Typical amount of pain: (Patient-Rptd) some Does pain affect daily life?: (!) (Patient-Rptd) yes Are you currently prescribed opioids?: no  Dietary Habits and Nutritional Risks How many meals a day?: (Patient-Rptd) 3 Eats fruit and vegetables daily?: (Patient-Rptd) yes Most meals are obtained by: (Patient-Rptd) preparing own meals; eating out; having others provide food In the last 2 weeks, have you had any of the following?: none Diabetic:: (!) yes Any non-healing wounds?: no How often do you check your BS?: as needed Would you like to be referred to a Nutritionist or for Diabetic Management? : no  Functional Status Activities of  Daily Living (to include ambulation/medication): (Patient-Rptd) Independent Ambulation: (Patient-Rptd) Independent Medication Administration: (Patient-Rptd) Independent Home Management (perform basic housework or laundry): (Patient-Rptd) Independent Manage your own finances?: (Patient-Rptd) yes Primary transportation is: (Patient-Rptd) driving Concerns about vision?: no *vision screening is required for WTM* Concerns about hearing?: no  Fall Screening Falls in the past year?: (Patient-Rptd) 0 Number of falls in past year: 0 Was there an injury with Fall?: 0 Fall Risk Category Calculator: 0 Patient Fall Risk Level: Low Fall Risk  Fall Risk Patient at Risk for Falls Due to: No Fall Risks Fall risk Follow up: Falls evaluation completed; Education provided; Falls prevention discussed  Home and Transportation Safety: All rugs have non-skid backing?: (Patient-Rptd) N/A, no rugs All stairs or steps have railings?: (Patient-Rptd) yes Grab bars in the bathtub or shower?: (!) (Patient-Rptd) no Have non-skid surface in bathtub or shower?: (!) (Patient-Rptd) no Good home lighting?: (Patient-Rptd) yes Regular seat belt use?: (Patient-Rptd) yes Hospital stays in the last year:: (Patient-Rptd) no  Cognitive Assessment Difficulty concentrating, remembering, or making decisions? : (Patient-Rptd) no Will 6CIT or Mini Cog be Completed: yes What year is it?: 0 points What month is it?: 0 points Give patient an address phrase to remember (5 components): [address] [place] About what time is it?: 0 points Count backwards from 20 to 1: 0 points Say the months of the year in reverse: 0 points Repeat the address phrase from earlier: 0 points 6 CIT Score: 0 points  Advance Directives (For Healthcare) Does Patient Have a Medical Advance Directive?: No Would patient like information on creating a medical advance directive?: No - Patient declined  Reviewed/Updated  Reviewed/Updated: Reviewed  All (Medical, Surgical, Family, Medications, Allergies, Care Teams, Patient Goals)    Allergies (verified) Lisinopril   Current  Medications (verified) Outpatient Encounter Medications as of 02/19/2024  Medication Sig   allopurinol  (ZYLOPRIM ) 100 MG tablet Take 1 tablet (100 mg total) by mouth daily.   atorvastatin  (LIPITOR) 80 MG tablet Take 1 tablet by mouth once daily   chlorthalidone  (HYGROTON ) 25 MG tablet Take 0.5 tablets (12.5 mg total) by mouth daily.   Cholecalciferol (VITAMIN D -3) 125 MCG (5000 UT) TABS Take 1 tablet by mouth daily.   glipiZIDE  (GLUCOTROL ) 10 MG tablet Take 1 tablet (10 mg total) by mouth 2 (two) times daily before a meal.   hydrALAZINE  (APRESOLINE ) 25 MG tablet Take 1 tablet (25 mg total) by mouth 2 (two) times daily.   JARDIANCE  10 MG TABS tablet Take 1 tablet by mouth once daily   labetalol  (NORMODYNE ) 200 MG tablet Take 1 tablet (200 mg total) by mouth 2 (two) times daily.   NIFEdipine  (PROCARDIA  XL/NIFEDICAL-XL) 90 MG 24 hr tablet Take 1 tablet by mouth once daily   predniSONE  (DELTASONE ) 20 MG tablet Take 1 tablet (20 mg total) by mouth daily with breakfast.   Semaglutide  (RYBELSUS ) 7 MG TABS Take 1 tablet (7 mg total) by mouth daily.   spironolactone  (ALDACTONE ) 25 MG tablet Take 1 tablet (25 mg total) by mouth daily.   No facility-administered encounter medications on file as of 02/19/2024.    History: Past Medical History:  Diagnosis Date   Hyperlipidemia    Hypertension    Resistant hypertension    Stroke (HCC)    2018   Vitamin D  deficiency disease 12/31/2018   Past Surgical History:  Procedure Laterality Date   CESAREAN SECTION     Family History  Problem Relation Age of Onset   Hypertension Mother    Hypertension Father    Arthritis Father    Diabetes Father    Bronchitis Brother        Every year   Hypertension Son    Hypertension Son    Colon cancer Neg Hx    Breast cancer Neg Hx    Social History   Occupational History    Not on file  Tobacco Use   Smoking status: Never   Smokeless tobacco: Never  Vaping Use   Vaping status: Never Used  Substance and Sexual Activity   Alcohol use: Never   Drug use: Never   Sexual activity: Not Currently    Birth control/protection: None   Tobacco Counseling Counseling given: Yes  SDOH Screenings   Food Insecurity: No Food Insecurity (02/18/2024)  Housing: Low Risk (02/18/2024)  Transportation Needs: No Transportation Needs (02/18/2024)  Utilities: Not At Risk (01/22/2023)  Alcohol Screen: Low Risk (01/22/2023)  Depression (PHQ2-9): Low Risk (02/19/2024)  Financial Resource Strain: Low Risk (02/18/2024)  Physical Activity: Insufficiently Active (02/18/2024)  Social Connections: Moderately Integrated (02/18/2024)  Stress: No Stress Concern Present (02/18/2024)  Tobacco Use: Low Risk (02/19/2024)  Health Literacy: Adequate Health Literacy (02/19/2024)   See flowsheets for full screening details  Depression Screen PHQ 2 & 9 Depression Scale- Over the past 2 weeks, how often have you been bothered by any of the following problems? Little interest or pleasure in doing things: 0 Feeling down, depressed, or hopeless (PHQ Adolescent also includes...irritable): 0 PHQ-2 Total Score: 0 Trouble falling or staying asleep, or sleeping too much: 0 Feeling tired or having little energy: 0 Poor appetite or overeating (PHQ Adolescent also includes...weight loss): 0 Feeling bad about yourself - or that you are a failure or have let yourself or your family down: 0 Trouble concentrating  on things, such as reading the newspaper or watching television [facility] Adolescent also includes...like school work): 0 Moving or speaking so slowly that other people could have noticed. Or the opposite - being so fidgety or restless that you have been moving around a lot more than usual: 0 Thoughts that you would be better off dead, or of hurting yourself in some way: 0 PHQ-9 Total Score: 0 If you checked off any  problems, how difficult have these problems made it for you to do your work, take care of things at home, or get along with other people?: Not difficult at all  Depression Treatment Depression Interventions/Treatment : EYV7-0 Score <4 Follow-up Not Indicated     Goals Addressed   None          Objective:    Today's Vitals   02/19/24 1311  BP: 138/84  Weight: 188 lb (85.3 kg)  Height: 5' 2 (1.575 m)   Body mass index is 34.39 kg/m.  Hearing/Vision screen Hearing Screening - Comments:: Patient denies any hearing difficulties.   Vision Screening - Comments:: Wears rx glasses - up to date with routine eye exams with  Dr. Elsie Bihari Immunizations and Health Maintenance Health Maintenance  Topic Date Due   Hepatitis C Screening  Never done   Fecal DNA (Cologuard)  Never done   Zoster Vaccines- Shingrix (1 of 2) Never done   OPHTHALMOLOGY EXAM  11/11/2022   COVID-19 Vaccine (1 - 2025-26 season) Never done   FOOT EXAM  11/06/2023   Diabetic kidney evaluation - Urine ACR  11/10/2023   Mammogram  11/22/2023   Medicare Annual Wellness (AWV)  01/22/2024   Influenza Vaccine  05/13/2024 (Originally 09/14/2023)   HEMOGLOBIN A1C  05/12/2024   Bone Density Scan  11/21/2024   Diabetic kidney evaluation - eGFR measurement  12/17/2024   DTaP/Tdap/Td (2 - Td or Tdap) 11/24/2026   Pneumococcal Vaccine: 50+ Years  Completed   Meningococcal B Vaccine  Aged Out        Assessment/Plan:  This is a routine wellness examination for Danielle Jordan.  Patient Care Team: Bacchus, Meade PEDLAR, FNP as PCP - General (Family Medicine) Alvan, Dorn FALCON, MD as Consulting Physician (Cardiology) Bihari Elsie, OD [facility])  I have personally reviewed and noted the following in the patients chart:   Medical and social history Use of alcohol, tobacco or illicit drugs  Current medications and supplements including opioid prescriptions. Functional ability and status Nutritional status Physical  activity Advanced directives List of other physicians Hospitalizations, surgeries, and ER visits in previous 12 months Vitals Screenings to include cognitive, depression, and falls Referrals and appointments  Orders Placed This Encounter  Procedures   MM 3D SCREENING MAMMOGRAM BILATERAL BREAST    Standing Status:   Future    Expiration Date:   02/18/2025    Reason for Exam (SYMPTOM  OR DIAGNOSIS REQUIRED):   breast cancer screening    Preferred imaging location?:   [facility]   Hepatitis C antibody   Microalbumin / creatinine urine ratio   In addition, I have reviewed and discussed with patient certain preventive protocols, quality metrics, and best practice recommendations. A written personalized care plan for preventive services as well as general preventive health recommendations were provided to patient.   Danielle Jordan, CMA   02/19/2024   Return February 20, 2025 at 1:10 pm, for In office Medicare Well Visit w  Wellness Nurse.  After Visit Summary: (MyChart) Due to this being a telephonic visit, the  after visit summary with patients personalized plan was offered to patient via MyChart   "

## 2024-02-21 ENCOUNTER — Other Ambulatory Visit: Payer: Self-pay | Admitting: Family Medicine

## 2024-02-21 DIAGNOSIS — I10 Essential (primary) hypertension: Secondary | ICD-10-CM

## 2024-03-03 ENCOUNTER — Encounter (HOSPITAL_COMMUNITY): Payer: Self-pay

## 2024-03-03 ENCOUNTER — Ambulatory Visit (HOSPITAL_COMMUNITY): Admission: RE | Admit: 2024-03-03 | Discharge: 2024-03-03 | Disposition: A | Source: Ambulatory Visit

## 2024-03-03 DIAGNOSIS — Z1231 Encounter for screening mammogram for malignant neoplasm of breast: Secondary | ICD-10-CM | POA: Diagnosis present

## 2024-04-15 ENCOUNTER — Ambulatory Visit: Admitting: Family Medicine

## 2025-02-20 ENCOUNTER — Ambulatory Visit: Payer: Self-pay
